# Patient Record
Sex: Female | Born: 2000 | Race: White | Hispanic: No | Marital: Single | Smoking: Never smoker
Health system: Southern US, Community
[De-identification: ages and names within clinical notes are randomized; demographics above are authoritative.]

## PROBLEM LIST (undated history)

## (undated) ENCOUNTER — Ambulatory Visit: Admission: EM | Payer: 59

## (undated) DIAGNOSIS — I959 Hypotension, unspecified: Secondary | ICD-10-CM

## (undated) DIAGNOSIS — R519 Headache, unspecified: Secondary | ICD-10-CM

## (undated) DIAGNOSIS — F419 Anxiety disorder, unspecified: Secondary | ICD-10-CM

## (undated) DIAGNOSIS — F329 Major depressive disorder, single episode, unspecified: Secondary | ICD-10-CM

## (undated) DIAGNOSIS — T7840XA Allergy, unspecified, initial encounter: Secondary | ICD-10-CM

## (undated) DIAGNOSIS — R51 Headache: Secondary | ICD-10-CM

## (undated) DIAGNOSIS — F32A Depression, unspecified: Secondary | ICD-10-CM

## (undated) DIAGNOSIS — D649 Anemia, unspecified: Secondary | ICD-10-CM

## (undated) HISTORY — DX: Headache, unspecified: R51.9

## (undated) HISTORY — DX: Hypotension, unspecified: I95.9

## (undated) HISTORY — DX: Allergy, unspecified, initial encounter: T78.40XA

## (undated) HISTORY — DX: Anxiety disorder, unspecified: F41.9

## (undated) HISTORY — PX: KNEE SURGERY: SHX244

## (undated) HISTORY — DX: Headache: R51

## (undated) HISTORY — PX: TOOTH EXTRACTION: SUR596

---
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
## 2019-01-01 ENCOUNTER — Other Ambulatory Visit (INDEPENDENT_AMBULATORY_CARE_PROVIDER_SITE_OTHER): Payer: Self-pay | Admitting: Neurology

## 2019-01-03 ENCOUNTER — Encounter (INDEPENDENT_AMBULATORY_CARE_PROVIDER_SITE_OTHER): Payer: Self-pay | Admitting: Neurology

## 2019-01-03 ENCOUNTER — Ambulatory Visit (INDEPENDENT_AMBULATORY_CARE_PROVIDER_SITE_OTHER): Payer: 59 | Admitting: Neurology

## 2019-01-03 VITALS — BP 100/68 | HR 82 | Ht 64.96 in | Wt 124.1 lb

## 2019-01-03 DIAGNOSIS — G43009 Migraine without aura, not intractable, without status migrainosus: Secondary | ICD-10-CM | POA: Diagnosis not present

## 2019-01-03 DIAGNOSIS — G44209 Tension-type headache, unspecified, not intractable: Secondary | ICD-10-CM

## 2019-01-03 MED ORDER — AMITRIPTYLINE HCL 25 MG PO TABS: 25.0000 mg | ORAL_TABLET | Freq: Every day | ORAL | 2 refills | Status: DC

## 2019-01-03 MED ORDER — PROPRANOLOL HCL 20 MG PO TABS: 20.0000 mg | ORAL_TABLET | Freq: Every day | ORAL | 0 refills | Status: DC

## 2019-01-03 NOTE — Progress Notes (Signed)
Patient: Andrea Wilson MRN: [id number] Sex: female DOB: 2001-06-16  Provider: Keturah Shaverseza Makiyah Zentz, MD [facility]  Note type: Routine return visit  Referral Source: Ruthe Mannanalia Aron, MD [facility] and Grandmother Chief Complaint: Headaches  History of Present Illness: Andrea Wilson is a 18 y.o. female is here for follow-up management of headache.  She has had episodes of migraine and tension type headaches with moderate to severe intensity and frequency for which she has been on 2 preventive medication including amitriptyline and propranolol. A couple of years ago she ran out of propranolol and she started having more frequent headaches so on her last visit she was recommended to continue both medications with very low-dose. She was last seen in January 2019 and since then she has been doing very well with no frequent headaches and has not been taking any OTC medications over the past few months except when she is sick or being congested. She usually sleeps well without any difficulty and with no awakening headaches.  She has no other issues such as dizziness or vomiting and has been doing fairly well academically at school. She has been taking both medications regularly without missing doses and has been tolerating medication well with no side effects.  Review of Systems: 12 system review as per HPI, otherwise negative.  Past Medical History:  Diagnosis Date  . Allergy   . Anxiety   . Depression   . Frequent headaches   . Hypotension    Hospitalizations: No., Head Injury: No., Nervous System Infections: No., Immunizations up to date: Yes.    Surgical History Past Surgical History:  Procedure Laterality Date  . KNEE SURGERY    . TOOTH EXTRACTION      Family History family history includes Autism in her cousin; Cancer in her maternal grandmother and sister; Hyperlipidemia in her mother; Hypertension in her father; Lung cancer  in her paternal grandfather; Migraines in an other family member; Seizures in her cousin.   Social History Social History   Socioeconomic History  . Marital status: Single    Spouse name: Not on file  . Number of children: Not on file  . Years of education: Not on file  . Highest education level: Not on file  Occupational History  . Not on file  Social Needs  . Financial resource strain: Not on file  . Food insecurity:    Worry: Not on file    Inability: Not on file  . Transportation needs:    Medical: Not on file    Non-medical: Not on file  Tobacco Use  . Smoking status: Never Smoker  . Smokeless tobacco: Never Used  Substance and Sexual Activity  . Alcohol use: No  . Drug use: No  . Sexual activity: Never  Lifestyle  . Physical activity:    Days per week: Not on file    Minutes per session: Not on file  . Stress: Not on file  Relationships  . Social connections:    Talks on phone: Not on file    Gets together: Not on file    Attends religious service: Not on file    Active member of club or organization: Not on file    Attends meetings of clubs or organizations: Not on file    Relationship status: Not on file  Other Topics Concern  . Not on file  Social History Narrative   12th grade student at  [employer]. She  does well in school.   Lives with mother and brother. Her sister passed away in 04-02-2013 at age 46 from cancer.   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      The medication list was reviewed and reconciled. All changes or newly prescribed medications were explained.  A complete medication list was provided to the patient/caregiver.  No Known Allergies  Physical Exam BP 100/68   Pulse 82   Ht 5' 4.96" (1.65 m)   Wt 124 lb 1.9 oz (56.3 kg)   LMP 12/08/2018   BMI 20.68 kg/m  Gen: Awake, alert, not in distress Skin: No rash, No neurocutaneous stigmata. HEENT: Normocephalic, no dysmorphic features, no conjunctival injection, nares  patent, mucous membranes moist, oropharynx clear. Neck: Supple, no meningismus. No focal tenderness. Resp: Clear to auscultation bilaterally CV: Regular rate, normal S1/S2, no murmurs, no rubs Abd: BS present, abdomen soft, non-tender, non-distended. No hepatosplenomegaly or mass Ext: Warm and well-perfused. No deformities, no muscle wasting, ROM full.  Neurological Examination: MS: Awake, alert, interactive. Normal eye contact, answered the questions appropriately, speech was fluent,  Normal comprehension.  Attention and concentration were normal. Cranial Nerves: Pupils were equal and reactive to light ( 5-50mm);  normal fundoscopic exam with sharp discs, visual field full with confrontation test; EOM normal, no nystagmus; no ptsosis, no double vision, intact facial sensation, face symmetric with full strength of facial muscles, hearing intact to finger rub bilaterally, palate elevation is symmetric, tongue protrusion is symmetric with full movement to both sides.  Sternocleidomastoid and trapezius are with normal strength. Tone-Normal Strength-Normal strength in all muscle groups DTRs-  Biceps Triceps Brachioradialis Patellar Ankle  R 2+ 2+ 2+ 2+ 2+  L 2+ 2+ 2+ 2+ 2+   Plantar responses flexor bilaterally, no clonus noted Sensation: Intact to light touch,  Romberg negative. Coordination: No dysmetria on FTN test. No difficulty with balance. Gait: Normal walk and run. Tandem gait was normal. Was able to perform toe walking and heel walking without difficulty.   Assessment and Plan 1. Migraine without aura and without status migrainosus, not intractable   2. Tension headache    This is an 18 year old female with history of migraine and tension type headaches with fairly good control on 2 preventive medications including amitriptyline and propranolol with fairly low dose.  She has no focal findings on her neurological examination.  She has not had any major headaches over the past few  months. Recommend to continue the same dose of propranolol at 20 mg every night. Recommend to decrease the dose of amitriptyline to half a tablet for the next month and see how she does.  If she develops more headaches, she may go back to the previous dose of medication otherwise she may discontinue medication at that time. She will continue with appropriate hydration and sleep and limited screen time. I would like to see her in 4 months for follow-up visit or sooner if she develops more frequent headaches.  She understood and agreed with the plan.  Meds ordered this encounter  Medications  . propranolol (INDERAL) 20 MG tablet    Sig: Take 1 tablet (20 mg total) by mouth at bedtime.    Dispense:  30 tablet    Refill:  0  . amitriptyline (ELAVIL) 25 MG tablet    Sig: Take 1 tablet (25 mg total) by mouth at bedtime.    Dispense:  90 tablet    Refill:  2

## 2019-01-03 NOTE — Patient Instructions (Signed)
Continue the same dose of propranolol every night for now Decrease the dose of amitriptyline to half a tablet every night for at least 1 month If no frequent headaches then you may discontinue amitriptyline otherwise you may go back to the previous dose of medication Continue with appropriate hydration and sleep and limited screen time May take occasional Tylenol or ibuprofen for moderate to severe headache Return in 4 months for follow-up visit

## 2019-01-04 ENCOUNTER — Other Ambulatory Visit (INDEPENDENT_AMBULATORY_CARE_PROVIDER_SITE_OTHER): Payer: Self-pay | Admitting: Neurology

## 2019-01-04 MED ORDER — PROPRANOLOL HCL 20 MG PO TABS: 20.0000 mg | ORAL_TABLET | Freq: Every day | ORAL | 0 refills | Status: DC

## 2019-01-04 NOTE — Telephone Encounter (Signed)
rx sent to the pharmacy for 90 days 

## 2019-01-04 NOTE — Telephone Encounter (Signed)
°  Who's calling (name and relationship to patient) : (mom) [name] Best contact number: [phone] Provider they see: Nab Reason for call: Salsabeel was seen in the office yesterday.  She needs a 3 month supply of Propanolol 20mg .  Mom said the 1 month RX that she was given yesterday has not been filled.     PRESCRIPTION REFILL ONLY  Name of prescription: Propanolol 20mg  [facility]: [facility]

## 2019-01-07 ENCOUNTER — Other Ambulatory Visit: Payer: Self-pay | Admitting: Primary Care

## 2019-01-07 DIAGNOSIS — F419 Anxiety disorder, unspecified: Principal | ICD-10-CM

## 2019-01-07 DIAGNOSIS — F329 Major depressive disorder, single episode, unspecified: Secondary | ICD-10-CM

## 2019-01-07 DIAGNOSIS — F32A Depression, unspecified: Secondary | ICD-10-CM

## 2019-01-08 NOTE — Telephone Encounter (Signed)
Last prescribed on 08/23/2018. Last office visit on 12/24/2018 with Dr Selena Batten. No future appointment

## 2019-01-09 NOTE — Telephone Encounter (Signed)
Noted, refill sent to pharmacy. 

## 2019-01-10 ENCOUNTER — Ambulatory Visit: Payer: 59 | Admitting: Family Medicine

## 2019-01-10 ENCOUNTER — Encounter: Payer: Self-pay | Admitting: Family Medicine

## 2019-01-10 VITALS — BP 106/64 | HR 113 | Temp 98.0°F | Ht 65.0 in

## 2019-01-10 DIAGNOSIS — J069 Acute upper respiratory infection, unspecified: Secondary | ICD-10-CM

## 2019-01-10 MED ORDER — AZITHROMYCIN 250 MG PO TABS: ORAL_TABLET | ORAL | 0 refills | Status: DC

## 2019-01-10 NOTE — Assessment & Plan Note (Signed)
Ongoing for over 2 wk -not resp to symptomatic care with worsening of sinus symptoms and cough  Cover with zpak  Disc use of expectorant with DM flonase Saline  Analgesic prn  Watch for fever/worse cough Update if not starting to improve in a week or if worsening

## 2019-01-10 NOTE — Patient Instructions (Signed)
mucinex dm is good for cough and congestion if needed  Continue flonase  Drink lots of fluids Tylenol and /or ibuprofen for fever or headaches or body aches   Take the zpak as directed - this many days in you may be developing a bacterial sinus or lung infection   Update if not starting to improve in a week or if worsening

## 2019-01-10 NOTE — Progress Notes (Signed)
Subjective:    Patient ID: Andrea Wilson, female    DOB: Jan 23, 2001, 18 y.o.   MRN: [id number]  HPI  Here for sinus symptoms /congestion   18 yo pt of NP Clark   Was seen for viral uri 1/20 with Dr Selena Batten Recommended sympt care -flonase/netti pot   Not getting better   otc Sudafed   md live - px cough medicine and flonase  Improved a little until yesterday  Woke up achey  Pain in R face/jaw Throat is sore   No fever  Some chills And the sweats  Took some tylenol last night   Nasal congestion- mostly clear/occ color Irritated throat -not severely sore  Feels worse on the right side   Coughing-all day  Some phlegm - ? If color to it   Patient Active Problem List   Diagnosis Date Noted  . Upper respiratory infection 01/10/2019  . Chest pain 08/23/2018  . Screening examination for STD (sexually transmitted disease) 05/08/2017  . Migraine without aura and without status migrainosus, not intractable 11/08/2016  . Chronic headaches 10/13/2016  . Anxiety and depression 04/30/2015   Past Medical History:  Diagnosis Date  . Allergy   . Anxiety   . Depression   . Frequent headaches   . Hypotension    Past Surgical History:  Procedure Laterality Date  . KNEE SURGERY    . TOOTH EXTRACTION     Social History   Tobacco Use  . Smoking status: Never Smoker  . Smokeless tobacco: Never Used  Substance Use Topics  . Alcohol use: No  . Drug use: No   Family History  Problem Relation Age of Onset  . Hyperlipidemia Mother   . Hypertension Father   . Cancer Sister   . Cancer Maternal Grandmother   . Lung cancer Paternal Grandfather   . Migraines Other        Mfhx of migraine  . Seizures Cousin   . Autism Cousin    No Known Allergies Current Outpatient Medications on File Prior to Visit  Medication Sig Dispense Refill  . amitriptyline (ELAVIL) 25 MG tablet Take 1 tablet (25 mg total) by mouth at bedtime. 90 tablet 2  . aspirin-acetaminophen-caffeine  (EXCEDRIN MIGRAINE) 250-250-65 MG tablet Take 2 tablets by mouth every 6 (six) hours as needed for headache.    . benzonatate (TESSALON) 100 MG capsule Take 1 capsule by mouth 2 (two) times daily as needed.    . fluticasone (FLONASE) 50 MCG/ACT nasal spray Place 1 spray into both nostrils daily.    . hydrOXYzine (ATARAX/VISTARIL) 25 MG tablet TAKE 1/2 TO 1 TABLET BY MOUTH 3 TIMES A DAY AS NEEDED FOR ANXIETY 30 tablet 0  . ibuprofen (ADVIL,MOTRIN) 800 MG tablet     . propranolol (INDERAL) 20 MG tablet Take 1 tablet (20 mg total) by mouth at bedtime. 90 tablet 0  . SUMAtriptan (IMITREX) 50 MG tablet TAKE 1 TABLET WITH 400-600 MG OF IBUPROFEN FOR MODERATE TO SEVERE HEADACHE, MAXIMUM 2 TIMES A WEEK 10 tablet 0  . venlafaxine XR (EFFEXOR-XR) 150 MG 24 hr capsule TAKE 1 CAPSULE(150 MG) BY MOUTH DAILY WITH BREAKFAST 90 capsule 1   No current facility-administered medications on file prior to visit.      Review of Systems  Constitutional: Positive for appetite change and fatigue. Negative for fever.  HENT: Positive for congestion, postnasal drip, rhinorrhea, sinus pressure, sneezing and sore throat. Negative for ear pain.   Eyes: Negative for pain and discharge.  Respiratory: Positive for cough. Negative for shortness of breath, wheezing and stridor.   Cardiovascular: Negative for chest pain.  Gastrointestinal: Negative for diarrhea, nausea and vomiting.  Genitourinary: Negative for frequency, hematuria and urgency.  Musculoskeletal: Negative for arthralgias and myalgias.  Skin: Negative for rash.  Neurological: Positive for headaches. Negative for dizziness, weakness and light-headedness.  Psychiatric/Behavioral: Negative for confusion and dysphoric mood.       Objective:   Physical Exam Constitutional:      General: She is not in acute distress.    Appearance: Normal appearance. She is well-developed. She is not ill-appearing, toxic-appearing or diaphoretic.  HENT:     Head: Normocephalic  and atraumatic.     Comments: Nares are injected and congested   Mild maxillary sinus tenderness    Right Ear: Tympanic membrane, ear canal and external ear normal.     Left Ear: Tympanic membrane, ear canal and external ear normal.     Nose: Congestion and rhinorrhea present.     Mouth/Throat:     Mouth: Mucous membranes are moist.     Pharynx: Oropharynx is clear. No oropharyngeal exudate or posterior oropharyngeal erythema.     Comments: Clear pnd  Eyes:     General:        Right eye: No discharge.        Left eye: No discharge.     Conjunctiva/sclera: Conjunctivae normal.     Pupils: Pupils are equal, round, and reactive to light.  Neck:     Musculoskeletal: Normal range of motion and neck supple.  Cardiovascular:     Rate and Rhythm: Normal rate.     Heart sounds: Normal heart sounds.  Pulmonary:     Effort: Pulmonary effort is normal. No respiratory distress.     Breath sounds: No stridor. Rhonchi present. No wheezing or rales.     Comments: Good air exch Scattered rhonchi No rales or wheezing Chest:     Chest wall: No tenderness.  Lymphadenopathy:     Cervical: No cervical adenopathy.  Skin:    General: Skin is warm and dry.     Capillary Refill: Capillary refill takes less than 2 seconds.     Findings: No rash.  Neurological:     Mental Status: She is alert.     Cranial Nerves: No cranial nerve deficit.  Psychiatric:        Mood and Affect: Mood normal.           Assessment & Plan:   Problem List Items Addressed This Visit      Respiratory   Upper respiratory infection - Primary    Ongoing for over 2 wk -not resp to symptomatic care with worsening of sinus symptoms and cough  Cover with zpak  Disc use of expectorant with DM flonase Saline  Analgesic prn  Watch for fever/worse cough Update if not starting to improve in a week or if worsening        Relevant Medications   azithromycin (ZITHROMAX Z-PAK) 250 MG tablet

## 2019-01-13 IMAGING — CT CT RENAL STONE PROTOCOL
2 of 4 series · 16 of 46 positions shown, 18 images · non-contrast
Comparison: None.

CLINICAL DATA: Abdominal pain for 5 days with some vomiting today

EXAM:
CT ABDOMEN AND PELVIS WITHOUT CONTRAST
TECHNIQUE: Multidetector CT imaging of the abdomen and pelvis was performed
following the standard protocol without IV contrast.

[Series 2: stone study 5.0 i30f 1 · axial · 0.60mm/px · z∈[-503,-173]mm · 13 of 75 slices shown, 15 images]
[im 6/75  soft-tissue]
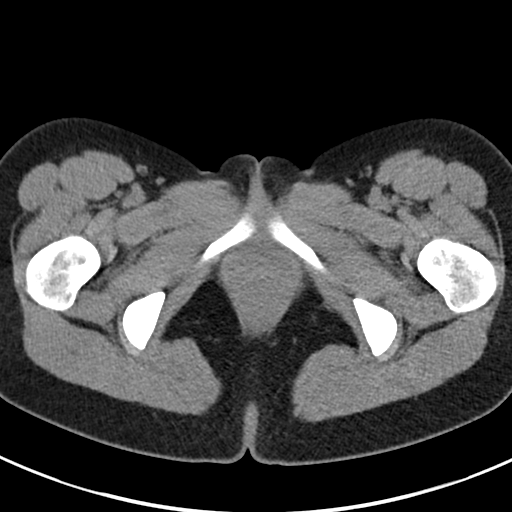
[im 6/75  bone]
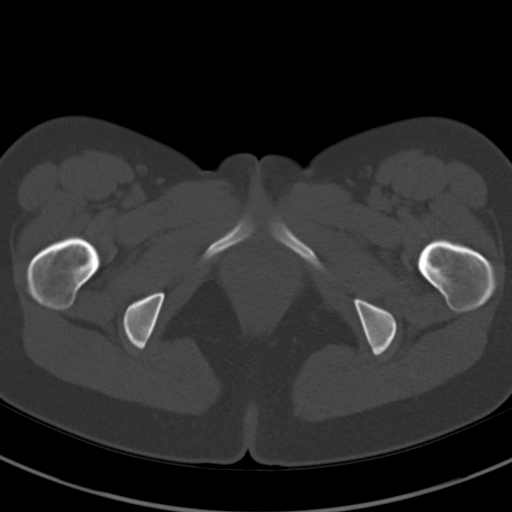
[im 11/75  soft-tissue]
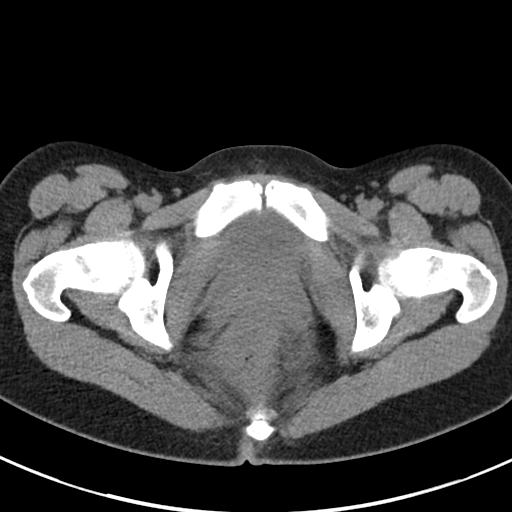
[im 17/75  soft-tissue]
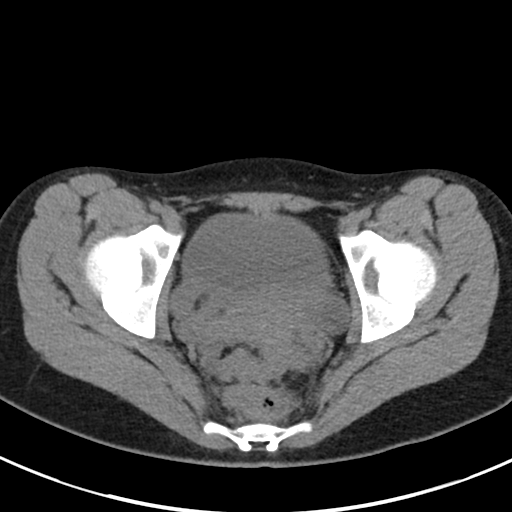
[im 22/75  soft-tissue]
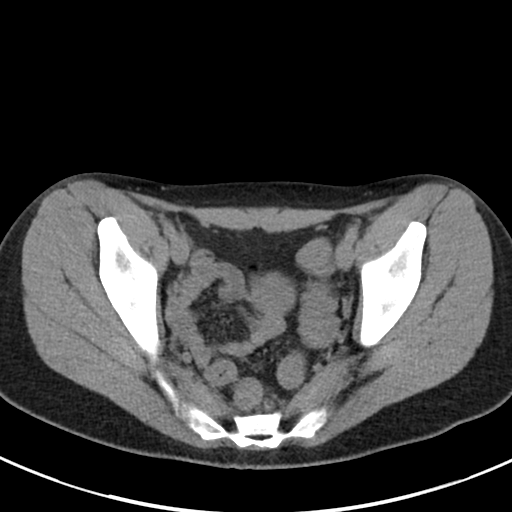
[im 28/75  soft-tissue]
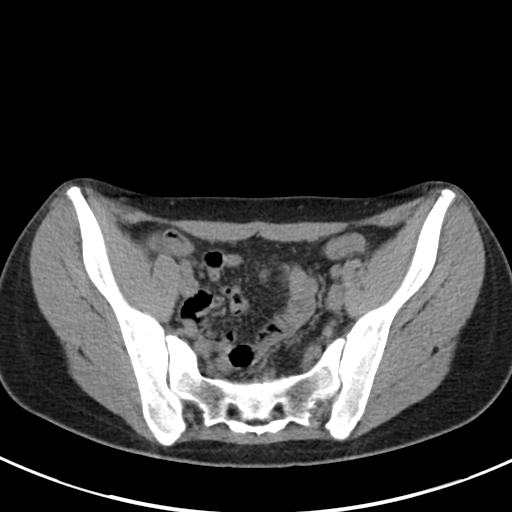
[im 33/75  soft-tissue]
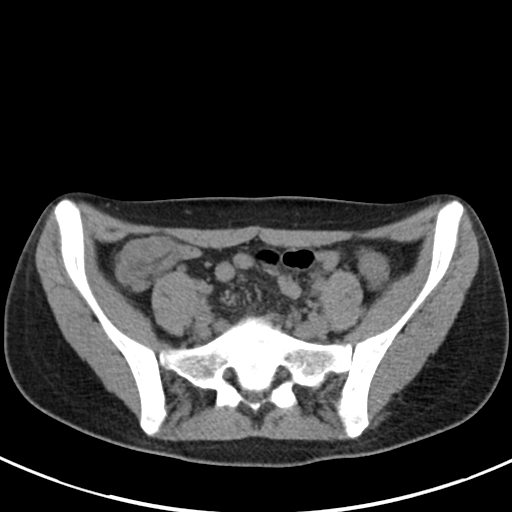
[im 39/75  soft-tissue]
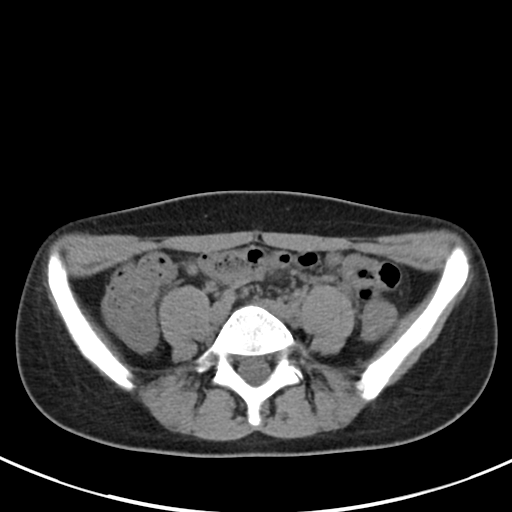
[im 44/75  soft-tissue]
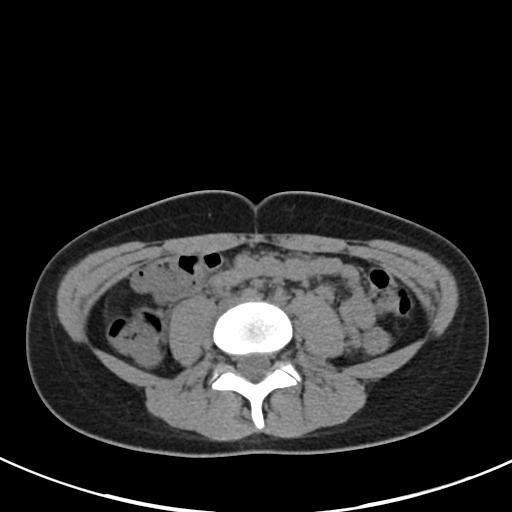
[im 50/75  soft-tissue]
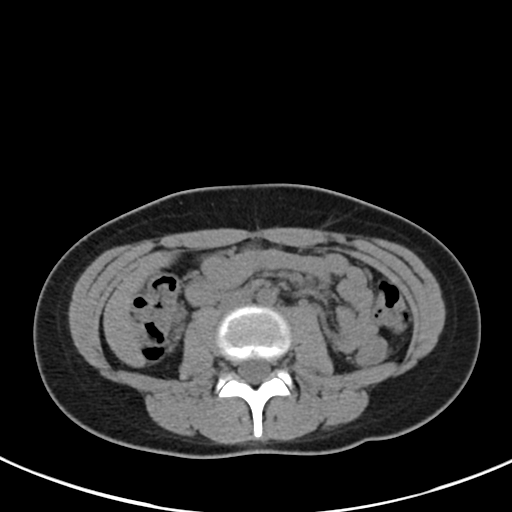
[im 50/75  bone]
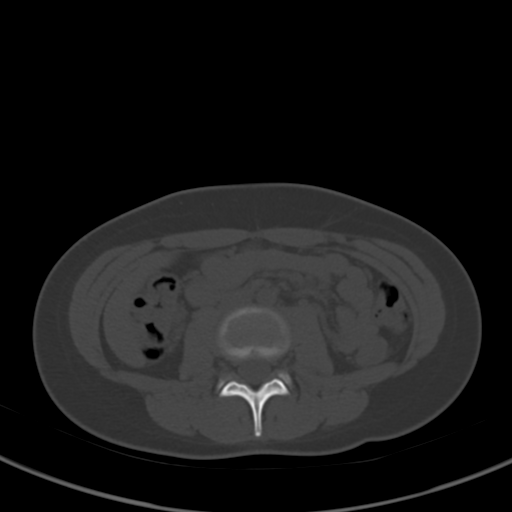
[im 55/75  soft-tissue]
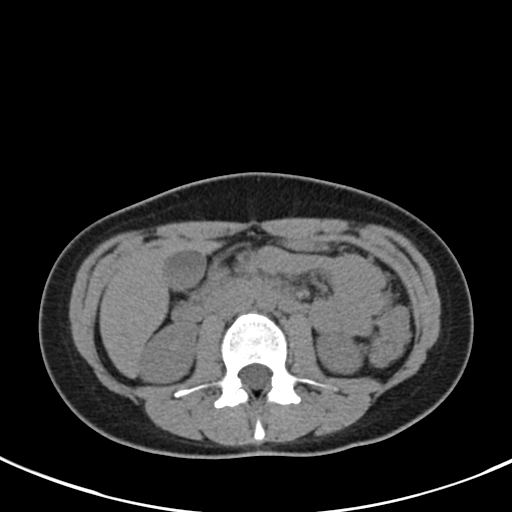
[im 61/75  soft-tissue]
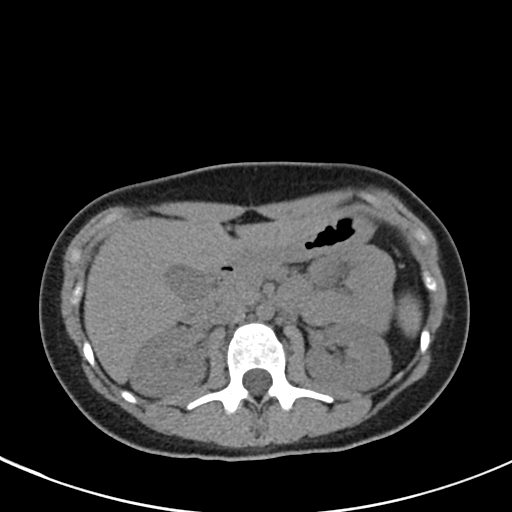
[im 66/75  soft-tissue]
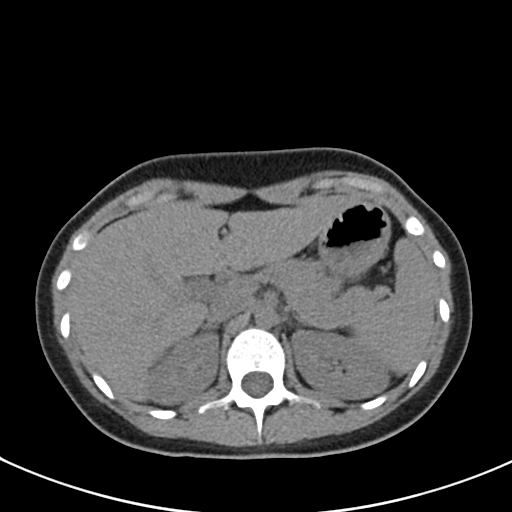
[im 72/75  soft-tissue]
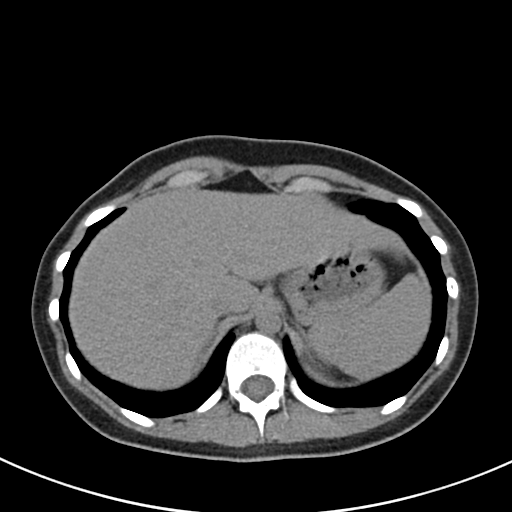

[Series 5: coronal soft tissue · coronal · 0.62mm/px · 3 of 56 slices shown]
[im 19/56  soft-tissue]
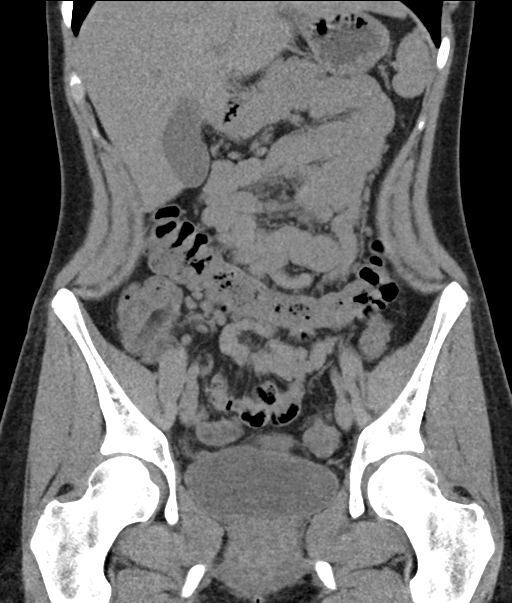
[im 25/56  soft-tissue]
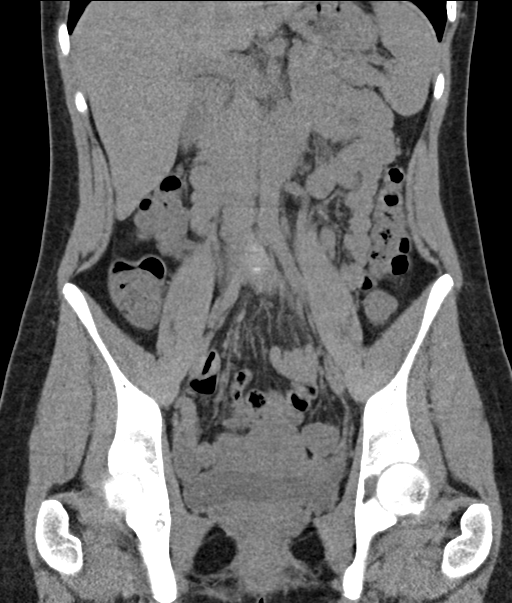
[im 31/56  soft-tissue]
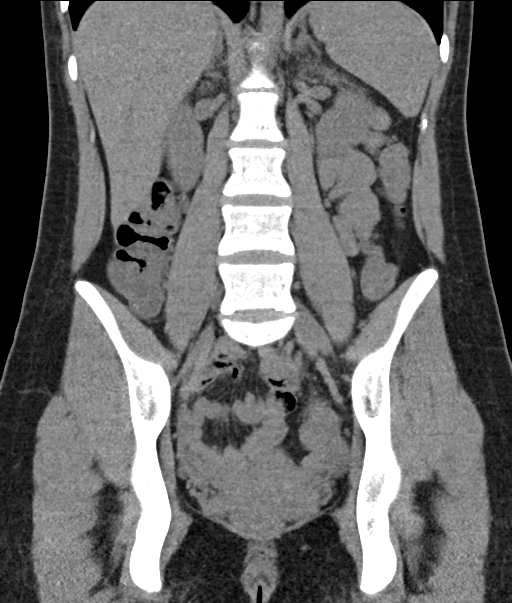

[16 of 46 positions shown; findings below may reference images not displayed]

FINDINGS: Lower chest: The lung bases are clear.

Hepatobiliary: The heart is within normal limits in size. No
calcified gallstones are seen.

Pancreas: On this unenhanced study, the pancreas is unremarkable.

Spleen: The spleen appears normal.

Adrenals/Urinary Tract: The adrenal glands appear symmetrical. No
renal calculi are seen and there is no evidence of hydronephrosis.
The ureters are difficult to visualize but no dilatation is seen.
The urinary bladder is unremarkable.

Stomach/Bowel: The stomach is largely decompressed. No small bowel
distention is seen. The colon is largely decompressed without
significant abnormality noted. The terminal ileum and the appendix
are difficult to visualize but n no evidence of inflammation is seen
within the right lower quadrant. There are a few slightly prominent
lymph nodes in the right lower quadrant and mesenteric adenitis
would be a consideration.

Vascular/Lymphatic: The abdominal aorta is normal in caliber. No
adenopathy is seen.

Reproductive: The uterus is normal in size. There appear to be small
follicles bilaterally the largest on the left of 2.2 cm. No free
fluid is seen within the pelvis.

Other: None.

Musculoskeletal: The lumbar vertebrae are in normal alignment.
Intervertebral disc spaces appear normal. The SI joints are well
corticated.
IMPRESSION: The only explanation for abdominal pain visualized on the current CT
are somewhat prominent lymph nodes in the right lower quadrant which
could indicate mesenteric adenitis. The appendix is not well seen
but no inflammatory process is noted.

## 2019-01-23 ENCOUNTER — Encounter: Payer: Self-pay | Admitting: Primary Care

## 2019-01-23 ENCOUNTER — Ambulatory Visit: Payer: 59 | Admitting: Primary Care

## 2019-01-23 DIAGNOSIS — J069 Acute upper respiratory infection, unspecified: Secondary | ICD-10-CM

## 2019-01-23 MED ORDER — HYDROCOD POLST-CPM POLST ER 10-8 MG/5ML PO SUER: 5.0000 mL | Freq: Two times a day (BID) | ORAL | 0 refills | Status: DC | PRN

## 2019-01-23 NOTE — Assessment & Plan Note (Signed)
Continued cough despite treatment on 01/10/19. Suspect post viral cough vs allergy involvement.  Exam today without evidence of bacterial cause. No wheezing, no prior history of asthma. Will have her start with Zyrtec nightly for at least one month. In order to provide her with some rest we will provide her with Tussionex to use HS for rest. Consider PPI if cough persists. She will update.

## 2019-01-23 NOTE — Patient Instructions (Addendum)
Start Zyrtec every night for the next one month.  You may take the cough suppressant every 12 hours as needed for cough and rest. Caution this medication contains codeine which may cause drowsiness.   Please call me in 2 weeks if no improvement in cough.  It was a pleasure to see you today!

## 2019-01-23 NOTE — Progress Notes (Signed)
Subjective:    Patient ID: [name], female    DOB: 03-18-2001, 18 y.o.   MRN: [id number]  HPI  Ms. Manzer is an 18 year old female who presents today with a chief complaint of cough.   She also reports sore throat (from cough), post nasal drip. She was last evaluated on 01/10/19 with a chief complaint of body aches, sore throat, chills, nasal congestion. She was treated with Azithromycin, Mucinex, Flonase.   Prior to her visit on 01/10/19 she was evaluated by Dr. Selena Batten on 12/24/18 for complaints of body aches, nasal congestion, sore throat, fatigue. She was presumed to have viral URI and was treated with symptomatic care. She tested negative for influenza.   Since treatment on 01/10/19 she noticed some improvement but no resolve. Her is present during the day and evening. Her cough has now been present since December 23, 2018. She's taking Tessalon Perles, OTC daytime pills without improvement.   Review of Systems  Constitutional: Positive for fatigue. Negative for fever.  HENT: Positive for postnasal drip and sore throat. Negative for sinus pressure.   Respiratory: Positive for cough.        Past Medical History:  Diagnosis Date  . Allergy   . Anxiety   . Depression   . Frequent headaches   . Hypotension      Social History   Socioeconomic History  . Marital status: Single    Spouse name: Not on file  . Number of children: Not on file  . Years of education: Not on file  . Highest education level: Not on file  Occupational History  . Not on file  Social Needs  . Financial resource strain: Not on file  . Food insecurity:    Worry: Not on file    Inability: Not on file  . Transportation needs:    Medical: Not on file    Non-medical: Not on file  Tobacco Use  . Smoking status: Never Smoker  . Smokeless tobacco: Never Used  Substance and Sexual Activity  . Alcohol use: No  . Drug use: No  . Sexual activity: Never  Lifestyle  . Physical activity:    Days  per week: Not on file    Minutes per session: Not on file  . Stress: Not on file  Relationships  . Social connections:    Talks on phone: Not on file    Gets together: Not on file    Attends religious service: Not on file    Active member of club or organization: Not on file    Attends meetings of clubs or organizations: Not on file    Relationship status: Not on file  . Intimate partner violence:    Fear of current or ex partner: Not on file    Emotionally abused: Not on file    Physically abused: Not on file    Forced sexual activity: Not on file  Other Topics Concern  . Not on file  Social History Narrative   12th grade student at  [employer]. She does well in school.   Lives with mother and brother. Her sister passed away in Apr 24, 2013 at age 92 from cancer.   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     Past Surgical History:  Procedure Laterality Date  . KNEE SURGERY    . TOOTH EXTRACTION      Family History  Problem Relation Age of Onset  . Hyperlipidemia Mother   . Hypertension  Father   . Cancer Sister   . Cancer Maternal Grandmother   . Lung cancer Paternal Grandfather   . Migraines Other        Mfhx of migraine  . Seizures Cousin   . Autism Cousin     No Known Allergies  Current Outpatient Medications on File Prior to Visit  Medication Sig Dispense Refill  . amitriptyline (ELAVIL) 25 MG tablet Take 1 tablet (25 mg total) by mouth at bedtime. 90 tablet 2  . aspirin-acetaminophen-caffeine (EXCEDRIN MIGRAINE) 250-250-65 MG tablet Take 2 tablets by mouth every 6 (six) hours as needed for headache.    . fluticasone (FLONASE) 50 MCG/ACT nasal spray Place 1 spray into both nostrils daily.    . hydrOXYzine (ATARAX/VISTARIL) 25 MG tablet TAKE 1/2 TO 1 TABLET BY MOUTH 3 TIMES A DAY AS NEEDED FOR ANXIETY 30 tablet 0  . ibuprofen (ADVIL,MOTRIN) 800 MG tablet     . propranolol (INDERAL) 20 MG tablet Take 1 tablet (20 mg total) by mouth at bedtime. 90 tablet 0    . SUMAtriptan (IMITREX) 50 MG tablet TAKE 1 TABLET WITH 400-600 MG OF IBUPROFEN FOR MODERATE TO SEVERE HEADACHE, MAXIMUM 2 TIMES A WEEK 10 tablet 0  . venlafaxine XR (EFFEXOR-XR) 150 MG 24 hr capsule TAKE 1 CAPSULE(150 MG) BY MOUTH DAILY WITH BREAKFAST 90 capsule 1   No current facility-administered medications on file prior to visit.     BP 118/74   Pulse (!) 105   Temp 98.2 F (36.8 C) (Oral)   Ht 5\' 5"  (1.651 m)   Wt 128 lb 8 oz (58.3 kg)   LMP 01/03/2019   SpO2 97%   BMI 21.38 kg/m    Objective:   Physical Exam  Constitutional: She appears well-nourished. She does not appear ill.  HENT:  Right Ear: Tympanic membrane and ear canal normal.  Left Ear: Tympanic membrane and ear canal normal.  Nose: No mucosal edema. Right sinus exhibits no maxillary sinus tenderness and no frontal sinus tenderness. Left sinus exhibits no maxillary sinus tenderness and no frontal sinus tenderness.  Mouth/Throat: Oropharynx is clear and moist.  Neck: Neck supple.  Cardiovascular: Normal rate and regular rhythm.  Respiratory: Effort normal and breath sounds normal. She has no wheezes.  Dry cough during exam  Skin: Skin is warm and dry.           Assessment & Plan:

## 2019-01-28 ENCOUNTER — Telehealth: Payer: Self-pay | Admitting: *Deleted

## 2019-01-28 DIAGNOSIS — J069 Acute upper respiratory infection, unspecified: Secondary | ICD-10-CM

## 2019-01-28 MED ORDER — HYDROCOD POLST-CPM POLST ER 10-8 MG/5ML PO SUER: 5.0000 mL | Freq: Two times a day (BID) | ORAL | 0 refills | Status: DC | PRN

## 2019-01-28 NOTE — Telephone Encounter (Signed)
Please notify patient that I am glad to hear that she is doing better and will provide her with one final refill of this cough medication. I will not be able to refill again.

## 2019-01-28 NOTE — Telephone Encounter (Signed)
Susette Racer (mom's boyfriend not on DPR) called stating that patient is doing better, but still has a terrible cough. Mr. Preston Fleeting stated that patient would like a refill on her cough medication. Mr. Preston Fleeting requested a call back to patient's mom if any questions. Pharmacy-[facility]/[place]

## 2019-01-28 NOTE — Telephone Encounter (Signed)
Tried to call patient's mother but voicemail box is full

## 2019-02-01 DIAGNOSIS — N3020 Other chronic cystitis without hematuria: Secondary | ICD-10-CM | POA: Diagnosis not present

## 2019-02-08 DIAGNOSIS — N898 Other specified noninflammatory disorders of vagina: Secondary | ICD-10-CM | POA: Diagnosis not present

## 2019-02-08 DIAGNOSIS — K644 Residual hemorrhoidal skin tags: Secondary | ICD-10-CM | POA: Diagnosis not present

## 2019-02-11 ENCOUNTER — Ambulatory Visit: Payer: 59 | Admitting: Primary Care

## 2019-02-11 ENCOUNTER — Encounter: Payer: Self-pay | Admitting: Primary Care

## 2019-02-11 DIAGNOSIS — K649 Unspecified hemorrhoids: Secondary | ICD-10-CM | POA: Insufficient documentation

## 2019-02-11 MED ORDER — HYDROCORTISONE ACETATE 25 MG RE SUPP: 25.0000 mg | Freq: Two times a day (BID) | RECTAL | 0 refills | Status: DC

## 2019-02-11 NOTE — Patient Instructions (Signed)
Insert the suppository twice daily in order to reduce the hemorrhoid size.   Please update me if no improvement within one week.  Make sure to eat plenty of fiber and drink plenty of water in order to prevent constipation.   It was a pleasure to see you today!   Hemorrhoids Hemorrhoids are swollen veins in and around the rectum or anus. There are two types of hemorrhoids:  Internal hemorrhoids. These occur in the veins that are just inside the rectum. They may poke through to the outside and become irritated and painful.  External hemorrhoids. These occur in the veins that are outside the anus and can be felt as a painful swelling or hard lump near the anus. Most hemorrhoids do not cause serious problems, and they can be managed with home treatments such as diet and lifestyle changes. If home treatments do not help the symptoms, procedures can be done to shrink or remove the hemorrhoids. What are the causes? This condition is caused by increased pressure in the anal area. This pressure may result from various things, including:  Constipation.  Straining to have a bowel movement.  Diarrhea.  Pregnancy.  Obesity.  Sitting for long periods of time.  Heavy lifting or other activity that causes you to strain.  Anal sex.  Riding a bike for a long period of time. What are the signs or symptoms? Symptoms of this condition include:  Pain.  Anal itching or irritation.  Rectal bleeding.  Leakage of stool (feces).  Anal swelling.  One or more lumps around the anus. How is this diagnosed? This condition can often be diagnosed through a visual exam. Other exams or tests may also be done, such as:  An exam that involves feeling the rectal area with a gloved hand (digital rectal exam).  An exam of the anal canal that is done using a small tube (anoscope).  A blood test, if you have lost a significant amount of blood.  A test to look inside the colon using a flexible tube  with a camera on the end (sigmoidoscopy or colonoscopy). How is this treated? This condition can usually be treated at home. However, various procedures may be done if dietary changes, lifestyle changes, and other home treatments do not help your symptoms. These procedures can help make the hemorrhoids smaller or remove them completely. Some of these procedures involve surgery, and others do not. Common procedures include:  Rubber band ligation. Rubber bands are placed at the base of the hemorrhoids to cut off their blood supply.  Sclerotherapy. Medicine is injected into the hemorrhoids to shrink them.  Infrared coagulation. A type of light energy is used to get rid of the hemorrhoids.  Hemorrhoidectomy surgery. The hemorrhoids are surgically removed, and the veins that supply them are tied off.  Stapled hemorrhoidopexy surgery. The surgeon staples the base of the hemorrhoid to the rectal wall. Follow these instructions at home: Eating and drinking   Eat foods that have a lot of fiber in them, such as whole grains, beans, nuts, fruits, and vegetables.  Ask your health care provider about taking products that have added fiber (fiber supplements).  Reduce the amount of fat in your diet. You can do this by eating low-fat dairy products, eating less red meat, and avoiding processed foods.  Drink enough fluid to keep your urine pale yellow. Managing pain and swelling   Take warm sitz baths for 20 minutes, 3-4 times a day to ease pain and discomfort. You may  do this in a bathtub or using a portable sitz bath that fits over the toilet.  If directed, apply ice to the affected area. Using ice packs between sitz baths may be helpful. ? Put ice in a plastic bag. ? Place a towel between your skin and the bag. ? Leave the ice on for 20 minutes, 2-3 times a day. General instructions  Take over-the-counter and prescription medicines only as told by your health care provider.  Use medicated  creams or suppositories as told.  Get regular exercise. Ask your health care provider how much and what kind of exercise is best for you. In general, you should do moderate exercise for at least 30 minutes on most days of the week (150 minutes each week). This can include activities such as walking, biking, or yoga.  Go to the bathroom when you have the urge to have a bowel movement. Do not wait.  Avoid straining to have bowel movements.  Keep the anal area dry and clean. Use wet toilet paper or moist towelettes after a bowel movement.  Do not sit on the toilet for long periods of time. This increases blood pooling and pain.  Keep all follow-up visits as told by your health care provider. This is important. Contact a health care provider if you have:  Increasing pain and swelling that are not controlled by treatment or medicine.  Difficulty having a bowel movement, or you are unable to have a bowel movement.  Pain or inflammation outside the area of the hemorrhoids. Get help right away if you have:  Uncontrolled bleeding from your rectum. Summary  Hemorrhoids are swollen veins in and around the rectum or anus.  Most hemorrhoids can be managed with home treatments such as diet and lifestyle changes.  Taking warm sitz baths can help ease pain and discomfort.  In severe cases, procedures or surgery can be done to shrink or remove the hemorrhoids. This information is not intended to replace advice given to you by your health care provider. Make sure you discuss any questions you have with your health care provider. Document Released: 11/18/2000 Document Revised: 04/12/2018 Document Reviewed: 04/12/2018 Elsevier Interactive Patient Education  2019 [employer].

## 2019-02-11 NOTE — Assessment & Plan Note (Signed)
External, non thrombosed. Rx for [facility] suppositories sent to pharmacy. Discussed sitz baths/warm baths, etc. She will update if no improvement.

## 2019-02-11 NOTE — Progress Notes (Signed)
Subjective:    Patient ID: Andrea Wilson, female    DOB: 11-01-2001, 18 y.o.   MRN: [id number]  HPI  Andrea Wilson is an 18 year old female who presents today with a chief complaint of hemorrhoid.  She thinks she has a hemorrhoid for which she noticed one month ago. She's been "pushing" the hemorrhoid back up into the rectum intermittently since then. Over the last one week she's noticed an increase in size and pain with discomfort in sitting. She went to her GYN three days ago who told her that she had a "blood clot" within the hemorrhoid. She was told to take really warm baths in order to decrease the size. She's not noticed a decrease in size. She's used an OTC "cream" without improvement. She did notice a small amount of bright red rectal bleeding several weeks ago.   She denies fevers, chills, recent rectal bleeding, diarrhea, constipation.   Review of Systems  Constitutional: Negative for chills and fever.  Gastrointestinal: Negative for abdominal pain, blood in stool, constipation, diarrhea and nausea.       Past Medical History:  Diagnosis Date  . Allergy   . Anxiety   . Depression   . Frequent headaches   . Hypotension      Social History   Socioeconomic History  . Marital status: Single    Spouse name: Not on file  . Number of children: Not on file  . Years of education: Not on file  . Highest education level: Not on file  Occupational History  . Not on file  Social Needs  . Financial resource strain: Not on file  . Food insecurity:    Worry: Not on file    Inability: Not on file  . Transportation needs:    Medical: Not on file    Non-medical: Not on file  Tobacco Use  . Smoking status: Never Smoker  . Smokeless tobacco: Never Used  Substance and Sexual Activity  . Alcohol use: No  . Drug use: No  . Sexual activity: Never  Lifestyle  . Physical activity:    Days per week: Not on file    Minutes per session: Not on file  . Stress: Not on file    Relationships  . Social connections:    Talks on phone: Not on file    Gets together: Not on file    Attends religious service: Not on file    Active member of club or organization: Not on file    Attends meetings of clubs or organizations: Not on file    Relationship status: Not on file  . Intimate partner violence:    Fear of current or ex partner: Not on file    Emotionally abused: Not on file    Physically abused: Not on file    Forced sexual activity: Not on file  Other Topics Concern  . Not on file  Social History Narrative   12th grade student at  [employer]. She does well in school.   Lives with mother and brother. Her sister passed away in 04-10-2013 at age 11 from cancer.   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     Past Surgical History:  Procedure Laterality Date  . KNEE SURGERY    . TOOTH EXTRACTION      Family History  Problem Relation Age of Onset  . Hyperlipidemia Mother   . Hypertension Father   . Cancer Sister   . Cancer Maternal  Grandmother   . Lung cancer Paternal Grandfather   . Migraines Other        Mfhx of migraine  . Seizures Cousin   . Autism Cousin     No Known Allergies  Current Outpatient Medications on File Prior to Visit  Medication Sig Dispense Refill  . amitriptyline (ELAVIL) 25 MG tablet Take 1 tablet (25 mg total) by mouth at bedtime. 90 tablet 2  . aspirin-acetaminophen-caffeine (EXCEDRIN MIGRAINE) 250-250-65 MG tablet Take 2 tablets by mouth every 6 (six) hours as needed for headache.    . chlorpheniramine-HYDROcodone (TUSSIONEX PENNKINETIC ER) 10-8 MG/5ML SUER Take 5 mLs by mouth every 12 (twelve) hours as needed for cough. 50 mL 0  . fluticasone (FLONASE) 50 MCG/ACT nasal spray Place 1 spray into both nostrils daily.    . hydrOXYzine (ATARAX/VISTARIL) 25 MG tablet TAKE 1/2 TO 1 TABLET BY MOUTH 3 TIMES A DAY AS NEEDED FOR ANXIETY 30 tablet 0  . ibuprofen (ADVIL,MOTRIN) 800 MG tablet     . propranolol (INDERAL) 20 MG  tablet Take 1 tablet (20 mg total) by mouth at bedtime. 90 tablet 0  . SUMAtriptan (IMITREX) 50 MG tablet TAKE 1 TABLET WITH 400-600 MG OF IBUPROFEN FOR MODERATE TO SEVERE HEADACHE, MAXIMUM 2 TIMES A WEEK 10 tablet 0  . venlafaxine XR (EFFEXOR-XR) 150 MG 24 hr capsule TAKE 1 CAPSULE(150 MG) BY MOUTH DAILY WITH BREAKFAST 90 capsule 1   No current facility-administered medications on file prior to visit.     BP 114/70   Pulse 89   Temp 98 F (36.7 C) (Oral)   Ht 5\' 5"  (1.651 m)   Wt 125 lb (56.7 kg)   LMP 02/06/2019   SpO2 98%   BMI 20.80 kg/m    Objective:   Physical Exam  Constitutional: She appears well-nourished.  Neck: Neck supple.  Cardiovascular: Normal rate and regular rhythm.  Respiratory: Effort normal and breath sounds normal.  Genitourinary: Rectum:     External hemorrhoid present.     No tenderness or internal hemorrhoid.   Skin: Skin is warm and dry.           Assessment & Plan:

## 2019-04-03 ENCOUNTER — Ambulatory Visit: Payer: Self-pay | Admitting: Primary Care

## 2019-04-03 NOTE — Telephone Encounter (Signed)
Returned pt's call regarding having a bloody stool. Pt stated that over the weekend she has some nausea and vomiting and a stomach ache and had a normal stool. Today she is feeling queasy and went to the bathroom and had a bloody stool, dark in color that turned the commode water "orangy". She stated  she had 2 stools today and the first one was a normal Bm. She is experiencing pain under her belly button this achy and somewhat stabbing at times. She does not feel bloated or distended. No fever. Pt is requesting an appointment for tomorrow. Advised that if she has more bloody stools or the pain gets worst to go to an urgent care of ED.  Advised on taking ginger ale and ice chips, bland foods for the queasiness. Also advised of having a virtual visit with her provider. Pt voiced understanding. Routing to [facility] at [facility] for review and appointment. Reason for Disposition . MODERATE rectal bleeding (small blood clots, passing blood without stool, or toilet water turns red)  Answer Assessment - Initial Assessment Questions 1. APPEARANCE of BLOOD: "What color is it?" "Is it passed separately, on the surface of the stool, or mixed in with the stool?"      Dark red and mixed with stool 2. AMOUNT: "How much blood was passed?"     Covered commode  3. FREQUENCY: "How many times has blood been passed with the stools?"     once 4. ONSET: "When was the blood first seen in the stools?" (Days or weeks)      yes 5. DIARRHEA: "Is there also some diarrhea?" If so, ask: "How many diarrhea stools were passed in past 24 hours?"      Yes just once 6. CONSTIPATION: "Do you have constipation?" If so, "How bad is it?"     no 7. RECURRENT SYMPTOMS: "Have you had blood in your stools before?" If so, ask: "When was the last time?" and "What happened that time?"      Never had blood in stool before 8. BLOOD THINNERS: "Do you take any blood thinners?" (e.g., Coumadin/warfarin, Pradaxa/dabigatran, aspirin)     No  9.  OTHER SYMPTOMS: "Do you have any other symptoms?"  (e.g., abdominal pain, vomiting, dizziness, fever)     Abdominal pain, vomited over the weekend, nauseated,  10. PREGNANCY: "Is there any chance you are pregnant?" "When was your last menstrual period?"       Not pregnant, LMP within the last month  Protocols used: RECTAL BLEEDING-A-AH

## 2019-04-04 ENCOUNTER — Ambulatory Visit (INDEPENDENT_AMBULATORY_CARE_PROVIDER_SITE_OTHER): Payer: 59 | Admitting: Primary Care

## 2019-04-04 DIAGNOSIS — R21 Rash and other nonspecific skin eruption: Secondary | ICD-10-CM

## 2019-04-04 DIAGNOSIS — K649 Unspecified hemorrhoids: Secondary | ICD-10-CM

## 2019-04-04 DIAGNOSIS — R1033 Periumbilical pain: Secondary | ICD-10-CM | POA: Diagnosis not present

## 2019-04-04 DIAGNOSIS — R109 Unspecified abdominal pain: Secondary | ICD-10-CM | POA: Insufficient documentation

## 2019-04-04 NOTE — Progress Notes (Signed)
Subjective:    Patient ID: Andrea Wilson, female    DOB: 2001-09-26, 18 y.o.   MRN: [id number]  HPI  Virtual Visit via Video Note  I connected with Andrea Wilson on 04/04/19 at  9:20 AM EDT by a video enabled telemedicine application and verified that I am speaking with the correct person using two identifiers.  Location: Patient: Home Provider: Office   I discussed the limitations of evaluation and management by telemedicine and the availability of in person appointments. The patient expressed understanding and agreed to proceed.  History of Present Illness:  [name] is an 18 year old female with a history of hemorrhoids who presents today with a chief complaint of rectal bleeding.  She called in yesterday with reports of nausea, vomiting, abdominal discomfort that began 5-6 days ago. Yesterday she felt nauseated, also with periumbilical abdominal pain so she proceeded to the bathroom where she had diarrhea with dark bloody bowel movement with one small dark red clot.    Today she has not had a bowel movement and feels mostly better, just feels tired. She denies nausea and abdominal pain today, she does notice a hemorrhoid. The hemorrhoid now that is overall not bothersome, but yesterday was painful during her bowel movement. She's had little PO intake including food and liquids over this week due to nausea. She denies fevers, esophageal reflux, diaphoresis, vomiting since last week.   She also reports a skin discoloration to the bilateral upper medial thighs that she first noticed yesterday. The rash is itchy and a purple color. She's been using an oatmeal soap and taken a bath with improvement. Her grandmother did recently change laundry detergents.    Observations/Objective:  Appears well, not sickly. Skin warm and dry. Abdomen non tender upon patient palpating. Speaking in complete sentences. Few small pinpoint dots to right medial upper thigh that appear purple. No  erythema, obvious changes in skin texture.   Assessment and Plan:  See problem based charting.  Follow Up Instructions:  Glad you are feeling better!  Please notify me if your abdominal pain returns, you develop fevers, your nausea and vomiting return, you continue to notice blood in the stools.  It was a pleasure to see you today!    I discussed the assessment and treatment plan with the patient. The patient was provided an opportunity to ask questions and all were answered. The patient agreed with the plan and demonstrated an understanding of the instructions.   The patient was advised to call back or seek an in-person evaluation if the symptoms worsen or if the condition fails to improve as anticipated.     Doreene Nest, NP    Review of Systems  Constitutional: Positive for fatigue. Negative for fever and unexpected weight change.  Respiratory: Negative for shortness of breath.   Cardiovascular: Negative for chest pain.  Gastrointestinal: Positive for abdominal pain, diarrhea and nausea.  Skin: Positive for rash.       Past Medical History:  Diagnosis Date  . Allergy   . Anxiety   . Depression   . Frequent headaches   . Hypotension      Social History   Socioeconomic History  . Marital status: Single    Spouse name: Not on file  . Number of children: Not on file  . Years of education: Not on file  . Highest education level: Not on file  Occupational History  . Not on file  Social Needs  . Financial  resource strain: Not on file  . Food insecurity:    Worry: Not on file    Inability: Not on file  . Transportation needs:    Medical: Not on file    Non-medical: Not on file  Tobacco Use  . Smoking status: Never Smoker  . Smokeless tobacco: Never Used  Substance and Sexual Activity  . Alcohol use: No  . Drug use: No  . Sexual activity: Never  Lifestyle  . Physical activity:    Days per week: Not on file    Minutes per session: Not on file  .  Stress: Not on file  Relationships  . Social connections:    Talks on phone: Not on file    Gets together: Not on file    Attends religious service: Not on file    Active member of club or organization: Not on file    Attends meetings of clubs or organizations: Not on file    Relationship status: Not on file  . Intimate partner violence:    Fear of current or ex partner: Not on file    Emotionally abused: Not on file    Physically abused: Not on file    Forced sexual activity: Not on file  Other Topics Concern  . Not on file  Social History Narrative   12th grade student at  [employer]. She does well in school.   Lives with mother and brother. Her sister passed away in 2014 at age 19 from cancer.   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     Past Surgical History:  Procedure Laterality Date  . KNEE SURGERY    . TOOTH EXTRACTION      Family History  Problem Relation Age of Onset  . Hyperlipidemia Mother   . Hypertension Father   . Cancer Sister   . Cancer Maternal Grandmother   . Lung cancer Paternal Grandfather   . Migraines Other        Mfhx of migraine  . Seizures Cousin   . Autism Cousin     No Known Allergies  Current Outpatient Medications on File Prior to Visit  Medication Sig Dispense Refill  . amitriptyline (ELAVIL) 25 MG tablet Take 1 tablet (25 mg total) by mouth at bedtime. 90 tablet 2  . aspirin-acetaminophen-caffeine (EXCEDRIN MIGRAINE) 250-250-65 MG tablet Take 2 tablets by mouth every 6 (six) hours as needed for headache.    . chlorpheniramine-HYDROcodone (TUSSIONEX PENNKINETIC ER) 10-8 MG/5ML SUER Take 5 mLs by mouth every 12 (twelve) hours as needed for cough. 50 mL 0  . fluticasone (FLONASE) 50 MCG/ACT nasal spray Place 1 spray into both nostrils daily.    . hydrocortisone (ANUSOL-HC) 25 MG suppository Place 1 suppository (25 mg total) rectally 2 (two) times daily. 12 suppository 0  . hydrOXYzine (ATARAX/VISTARIL) 25 MG tablet TAKE 1/2  TO 1 TABLET BY MOUTH 3 TIMES A DAY AS NEEDED FOR ANXIETY 30 tablet 0  . ibuprofen (ADVIL,MOTRIN) 800 MG tablet     . propranolol (INDERAL) 20 MG tablet Take 1 tablet (20 mg total) by mouth at bedtime. 90 tablet 0  . SUMAtriptan (IMITREX) 50 MG tablet TAKE 1 TABLET WITH 400-600 MG OF IBUPROFEN FOR MODERATE TO SEVERE HEADACHE, MAXIMUM 2 TIMES A WEEK 10 tablet 0  . venlafaxine XR (EFFEXOR-XR) 150 MG 24 hr capsule TAKE 1 CAPSULE(150 MG) BY MOUTH DAILY WITH BREAKFAST 90 capsule 1   No current facility-administered medications on file prior to visit.  There were no vitals taken for this visit.   Objective:   Physical Exam  Constitutional: She is oriented to person, place, and time. She appears well-nourished. She does not have a sickly appearance. She does not appear ill.  Respiratory: Effort normal.  GI: Soft. Normal appearance. There is no abdominal tenderness.  Neurological: She is alert and oriented to person, place, and time.  Skin: Skin is dry.  Few small pinpoint dots to right medial upper thigh that appear purple. No erythema, obvious changes in skin texture.    Psychiatric: She has a normal mood and affect.           Assessment & Plan:

## 2019-04-04 NOTE — Assessment & Plan Note (Signed)
No obvious rash, just a few flat, purple, purpura. No evidence of wound, cellulitis, insect bite. Discussed antihistamine, hydrocorotisone for itching. Consider switching to original laundry detergent.  She will update.

## 2019-04-04 NOTE — Assessment & Plan Note (Signed)
One episode of "dark red" bleeding with one small clot after a bowel movement. Also with hemorrhoid discomfort.  Suspect bleeding to be secondary to hemorrhoid, she only had one episode of bleeding and it occurred with a bowel movement.   Discussed to notify if her rectal bleeding returns, at that point we will need to bring her into the office for a physical exam and further work up.   I did recommend preparation H OTC as she could not afford the Anusol HC suppositories. She will update.

## 2019-04-04 NOTE — Assessment & Plan Note (Signed)
Present for nearly one week, no pain today.  She did have associated symptoms of nausea with 2 episodes of vomiting. Exam today reassuring, she was able to palpate her entire abdomen and had no discomfort. Abdomen appeared soft.   Differentials include IBS, viral gastritis, GERD. Low suspicion for acute appendicitis, cholecystitis.  She will update if symptoms return.

## 2019-04-04 NOTE — Patient Instructions (Signed)
Glad you are feeling better!  Please notify me if your abdominal pain returns, you develop fevers, your nausea and vomiting return, you continue to notice blood in the stools.  It was a pleasure to see you today!

## 2019-04-04 NOTE — Telephone Encounter (Signed)
Patient evaluated.  

## 2019-04-04 NOTE — Telephone Encounter (Addendum)
I spoke with pt; today she feels a little better;no nausea but pt has developed rash on both thighs, one is on inner thigh and the other is near the groin area.rash started on 04/03/19. Rash is not spreading but is popping up in different places on the thighs. Rash is itchy. Pt said she is itchy everywhere but only has rash on thighs; no fever,no cough and no SOB. No travel and no known exposure to +covid or flu. Pt had diarrhea with dark red blood that was mixed with stool in commode. Pt did see one clot of blood in commode also 04/03/19 at 5 PM. Pt has not had BM since and has seen no more blood from rectum.no abd pain today but 04/03/19 had lower achy and stabbing pain in lower abd.no UTI symptoms. No back pain. Pt scheduled virtual visit 04/04/19 at 9:20.

## 2019-04-10 ENCOUNTER — Telehealth (INDEPENDENT_AMBULATORY_CARE_PROVIDER_SITE_OTHER): Payer: Self-pay

## 2019-04-10 MED ORDER — PROPRANOLOL HCL 20 MG PO TABS: 20.0000 mg | ORAL_TABLET | Freq: Every day | ORAL | 0 refills | Status: DC

## 2019-04-10 NOTE — Telephone Encounter (Signed)
refill 

## 2019-05-22 ENCOUNTER — Other Ambulatory Visit: Payer: Self-pay

## 2019-05-22 ENCOUNTER — Ambulatory Visit (INDEPENDENT_AMBULATORY_CARE_PROVIDER_SITE_OTHER): Payer: 59 | Admitting: Primary Care

## 2019-05-22 DIAGNOSIS — R110 Nausea: Secondary | ICD-10-CM | POA: Insufficient documentation

## 2019-05-22 DIAGNOSIS — R946 Abnormal results of thyroid function studies: Secondary | ICD-10-CM | POA: Diagnosis not present

## 2019-05-22 NOTE — Patient Instructions (Signed)
Call the office to schedule a lab only appointment.  You can try famotidine (Pepcid) 1-2 times daily for your symptoms.  Advance your diet as tolerated. Make sure to drink plenty of water.  It was a pleasure to see you today!

## 2019-05-22 NOTE — Assessment & Plan Note (Signed)
Nausea with decrease in appetite x 1 week. Doesn't appear sickly, no distress. Differentials include silent reflux, IBS. Repeat thyroid labs, add on CBC. Recommend Pepcid 1-2 times daily for potential silent reflux. Await labs. Return precautions provided.

## 2019-05-22 NOTE — Progress Notes (Signed)
Subjective:    Patient ID: Andrea Wilson, female    DOB: 04/01/2001, 18 y.o.   MRN: [id number]  HPI  Virtual Visit via Video Note  I connected with Andrea Wilson on 05/22/19 at 11:20 AM EDT by a video enabled telemedicine application and verified that I am speaking with the correct person using two identifiers.  Location: Patient: Home Provider: Office   I discussed the limitations of evaluation and management by telemedicine and the availability of in person appointments. The patient expressed understanding and agreed to proceed.  History of Present Illness:  [name] is an 18 year old female with a history of hemorrhoids, anxiety and depression, migraines who presents today with a chief complaint of nausea and vomiting.  She also reports decrease in appetite. Symptoms began about one week ago with decrease appetite and nausea. Two evenings ago she tried eating ice cream as she hadn't eaten anything that day. Soon after eating ice cream she vomited. This was the first and only time she has vomited. She has been eating and drinking, just small portions.   She denies diarrhea, constipation, abdominal pain, increased anxiety, esophageal burning, belching. Her last bowel movement was yesterday which was softer than usual.   She does have a history of abnormal TSH from July 2019, repeat TSH and Free T4 in September 2019 unremarkable.    Observations/Objective:  Alert and oriented. Appears well, not sickly. No distress. Speaking in complete sentences.   Assessment and Plan:  Nausea with decrease in appetite x 1 week. Doesn't appear sickly, no distress. Differentials include silent reflux, IBS. Repeat thyroid labs, add on CBC. Recommend Pepcid 1-2 times daily for potential silent reflux. Await labs. Return precautions provided.   Follow Up Instructions:  Call the office to schedule a lab only appointment.  You can try famotidine (Pepcid) 1-2 times daily for your  symptoms.  Advance your diet as tolerated. Make sure to drink plenty of water.  It was a pleasure to see you today!    I discussed the assessment and treatment plan with the patient. The patient was provided an opportunity to ask questions and all were answered. The patient agreed with the plan and demonstrated an understanding of the instructions.   The patient was advised to call back or seek an in-person evaluation if the symptoms worsen or if the condition fails to improve as anticipated.     Doreene NestKatherine K Tonji Elliff, NP    Review of Systems  Constitutional: Negative for chills and fever.  Cardiovascular: Negative for palpitations.  Gastrointestinal: Positive for nausea. Negative for abdominal pain, constipation and diarrhea.       One episode of vomiting  Endocrine: Negative for cold intolerance and heat intolerance.       Past Medical History:  Diagnosis Date  . Allergy   . Anxiety   . Depression   . Frequent headaches   . Hypotension      Social History   Socioeconomic History  . Marital status: Single    Spouse name: Not on file  . Number of children: Not on file  . Years of education: Not on file  . Highest education level: Not on file  Occupational History  . Not on file  Social Needs  . Financial resource strain: Not on file  . Food insecurity    Worry: Not on file    Inability: Not on file  . Transportation needs    Medical: Not on file  Non-medical: Not on file  Tobacco Use  . Smoking status: Never Smoker  . Smokeless tobacco: Never Used  Substance and Sexual Activity  . Alcohol use: No  . Drug use: No  . Sexual activity: Never  Lifestyle  . Physical activity    Days per week: Not on file    Minutes per session: Not on file  . Stress: Not on file  Relationships  . Social Herbalist on phone: Not on file    Gets together: Not on file    Attends religious service: Not on file    Active member of club or organization: Not on file     Attends meetings of clubs or organizations: Not on file    Relationship status: Not on file  . Intimate partner violence    Fear of current or ex partner: Not on file    Emotionally abused: Not on file    Physically abused: Not on file    Forced sexual activity: Not on file  Other Topics Concern  . Not on file  Social History Narrative   12th grade student at  [employer]. She does well in school.   Lives with mother and brother. Her sister passed away in Apr 16, 2013 at age 79 from cancer.   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     Past Surgical History:  Procedure Laterality Date  . KNEE SURGERY    . TOOTH EXTRACTION      Family History  Problem Relation Age of Onset  . Hyperlipidemia Mother   . Hypertension Father   . Cancer Sister   . Cancer Maternal Grandmother   . Lung cancer Paternal Grandfather   . Migraines Other        Mfhx of migraine  . Seizures Cousin   . Autism Cousin     No Known Allergies  Current Outpatient Medications on File Prior to Visit  Medication Sig Dispense Refill  . amitriptyline (ELAVIL) 25 MG tablet Take 1 tablet (25 mg total) by mouth at bedtime. 90 tablet 2  . aspirin-acetaminophen-caffeine (EXCEDRIN MIGRAINE) 250-250-65 MG tablet Take 2 tablets by mouth every 6 (six) hours as needed for headache.    . fluticasone (FLONASE) 50 MCG/ACT nasal spray Place 1 spray into both nostrils daily.    . hydrOXYzine (ATARAX/VISTARIL) 25 MG tablet TAKE 1/2 TO 1 TABLET BY MOUTH 3 TIMES A DAY AS NEEDED FOR ANXIETY 30 tablet 0  . ibuprofen (ADVIL,MOTRIN) 800 MG tablet     . propranolol (INDERAL) 20 MG tablet Take 1 tablet (20 mg total) by mouth at bedtime. 90 tablet 0  . SUMAtriptan (IMITREX) 50 MG tablet TAKE 1 TABLET WITH 400-600 MG OF IBUPROFEN FOR MODERATE TO SEVERE HEADACHE, MAXIMUM 2 TIMES A WEEK 10 tablet 0  . venlafaxine XR (EFFEXOR-XR) 150 MG 24 hr capsule TAKE 1 CAPSULE(150 MG) BY MOUTH DAILY WITH BREAKFAST 90 capsule 1   No current  facility-administered medications on file prior to visit.     There were no vitals taken for this visit.   Objective:   Physical Exam  Constitutional: She is oriented to person, place, and time. She appears well-nourished. She does not have a sickly appearance. She does not appear ill.  Respiratory: Effort normal. No respiratory distress.  Neurological: She is alert and oriented to person, place, and time.  Psychiatric: She has a normal mood and affect.           Assessment & Plan:

## 2019-05-22 NOTE — Assessment & Plan Note (Signed)
Low TSH in July 2019, repeat testing from September 2019 unremarkable. Repeat TSH and Free T4 pending.

## 2019-05-24 ENCOUNTER — Other Ambulatory Visit (INDEPENDENT_AMBULATORY_CARE_PROVIDER_SITE_OTHER): Payer: 59

## 2019-05-24 DIAGNOSIS — R946 Abnormal results of thyroid function studies: Secondary | ICD-10-CM

## 2019-05-24 DIAGNOSIS — R110 Nausea: Secondary | ICD-10-CM

## 2019-05-24 NOTE — Addendum Note (Signed)
Addended by: Ellamae Sia on: 05/24/2019 09:07 AM   Modules accepted: Orders

## 2019-05-25 LAB — CBC WITH DIFFERENTIAL/PLATELET
Basophils Absolute: 0.1 10*3/uL (ref 0.0–0.2)
Basos: 1 %
EOS (ABSOLUTE): 0.1 10*3/uL (ref 0.0–0.4)
Eos: 1 %
Hematocrit: 39.7 % (ref 34.0–46.6)
Hemoglobin: 13.3 g/dL (ref 11.1–15.9)
Immature Grans (Abs): 0 10*3/uL (ref 0.0–0.1)
Immature Granulocytes: 0 %
Lymphocytes Absolute: 2.6 10*3/uL (ref 0.7–3.1)
Lymphs: 44 %
MCH: 28.9 pg (ref 26.6–33.0)
MCHC: 33.5 g/dL (ref 31.5–35.7)
MCV: 86 fL (ref 79–97)
Monocytes Absolute: 0.6 10*3/uL (ref 0.1–0.9)
Monocytes: 11 %
Neutrophils Absolute: 2.5 10*3/uL (ref 1.4–7.0)
Neutrophils: 43 %
Platelets: 361 10*3/uL (ref 150–450)
RBC: 4.6 x10E6/uL (ref 3.77–5.28)
RDW: 11.9 % (ref 11.7–15.4)
WBC: 5.7 10*3/uL (ref 3.4–10.8)

## 2019-05-25 LAB — BASIC METABOLIC PANEL
BUN/Creatinine Ratio: 16 (ref 9–23)
BUN: 12 mg/dL (ref 6–20)
CO2: 21 mmol/L (ref 20–29)
Calcium: 9.7 mg/dL (ref 8.7–10.2)
Chloride: 103 mmol/L (ref 96–106)
Creatinine, Ser: 0.75 mg/dL (ref 0.57–1.00)
GFR calc Af Amer: 135 mL/min/{1.73_m2} (ref >59)
GFR calc non Af Amer: 117 mL/min/{1.73_m2} (ref >59)
Glucose: 78 mg/dL (ref 65–99)
Potassium: 4.6 mmol/L (ref 3.5–5.2)
Sodium: 143 mmol/L (ref 134–144)

## 2019-05-25 LAB — T4, FREE: Free T4: 1.02 ng/dL (ref 0.93–1.60)

## 2019-05-25 LAB — TSH: TSH: 0.928 u[IU]/mL (ref 0.450–4.500)

## 2019-07-06 ENCOUNTER — Other Ambulatory Visit: Payer: Self-pay | Admitting: Primary Care

## 2019-07-06 DIAGNOSIS — F32A Depression, unspecified: Secondary | ICD-10-CM

## 2019-07-06 DIAGNOSIS — F329 Major depressive disorder, single episode, unspecified: Secondary | ICD-10-CM

## 2019-07-06 DIAGNOSIS — F419 Anxiety disorder, unspecified: Secondary | ICD-10-CM

## 2019-09-05 ENCOUNTER — Telehealth: Payer: Self-pay

## 2019-09-05 ENCOUNTER — Other Ambulatory Visit: Payer: Self-pay

## 2019-09-05 DIAGNOSIS — Z20822 Contact with and (suspected) exposure to covid-19: Secondary | ICD-10-CM

## 2019-09-05 NOTE — Telephone Encounter (Signed)
Noted, agree to Covid-testing. Have her follow up if symptoms persist and Covid test negative.

## 2019-09-05 NOTE — Telephone Encounter (Signed)
Pt called back and will go for covid testing today at either [facility] or [facility] visitor entrance. ED precautions and quarantine info given and pt voiced understanding. FYI to Gentry Fitz NP.

## 2019-09-05 NOTE — Telephone Encounter (Signed)
Pt said last night pt had bad H/a with nausea, chills and shaky while at work last night(pt works in [employer] and pt wears mask and washes hands often when at work), no H/A today but having diarrhea and loss of taste; pt is drinking a cup of coffee and it is tasteless like drinking water. 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 Pt is going to call her mom to see if she is going to UC or wants [facility] to order covid test. I advised pt she should self quarantine until gets covid testing results. Pt will cb after talks with her mom to advise what she is going to do. FYI to Gentry Fitz NP.

## 2019-09-06 LAB — NOVEL CORONAVIRUS, NAA: SARS-CoV-2, NAA: NOT DETECTED

## 2019-09-06 NOTE — Telephone Encounter (Signed)
Spoken and notified patient of Kate Clark's comments. Patient verbalized understanding.  

## 2019-09-13 ENCOUNTER — Other Ambulatory Visit: Payer: Self-pay

## 2019-09-13 ENCOUNTER — Telehealth: Payer: Self-pay | Admitting: Primary Care

## 2019-09-13 ENCOUNTER — Ambulatory Visit: Payer: 59 | Admitting: Primary Care

## 2019-09-13 ENCOUNTER — Encounter: Payer: Self-pay | Admitting: Primary Care

## 2019-09-13 VITALS — BP 120/80 | HR 117 | Temp 97.8°F | Ht 65.0 in | Wt 125.5 lb

## 2019-09-13 DIAGNOSIS — F419 Anxiety disorder, unspecified: Secondary | ICD-10-CM

## 2019-09-13 DIAGNOSIS — G43009 Migraine without aura, not intractable, without status migrainosus: Secondary | ICD-10-CM | POA: Diagnosis not present

## 2019-09-13 DIAGNOSIS — N3020 Other chronic cystitis without hematuria: Secondary | ICD-10-CM

## 2019-09-13 DIAGNOSIS — Z23 Encounter for immunization: Secondary | ICD-10-CM

## 2019-09-13 DIAGNOSIS — R519 Headache, unspecified: Secondary | ICD-10-CM

## 2019-09-13 DIAGNOSIS — G8929 Other chronic pain: Secondary | ICD-10-CM

## 2019-09-13 DIAGNOSIS — F329 Major depressive disorder, single episode, unspecified: Secondary | ICD-10-CM

## 2019-09-13 DIAGNOSIS — F32A Depression, unspecified: Secondary | ICD-10-CM

## 2019-09-13 MED ORDER — PROPRANOLOL HCL 20 MG PO TABS: 20.0000 mg | ORAL_TABLET | Freq: Every day | ORAL | 3 refills | Status: DC

## 2019-09-13 NOTE — Assessment & Plan Note (Signed)
Following with Urology, using Cipro PRN.

## 2019-09-13 NOTE — Progress Notes (Signed)
Subjective:    Patient ID: Andrea Wilson, female    DOB: 03/09/2001, 18 y.o.   MRN: [id number]  HPI  Andrea Wilson is an 18 year old female who presents today for follow up of chronic conditions.  1) Migraines/Headaches: Previously following with pediatric neurology for chronic headaches, has now aged out. Currently managed on Propranolol HS and Amitriptyline 25 mg HS with sumatriptan PRN.   She's not had to use her sumatriptan in quite some time as it causes GI upset. Overall she feels well managed on her current regimen. She is needing refills of propranolol as she ran out about one month ago. Since running out she's noticed an increase in headache frequency.   2) Anxiety and Depression: Currently managed on venlafaxine ER 150 mg daily. Overall she feels well managed on venlafaxine ER 150 mg, but has had increased stress as she is overworking herself. 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 Only has Sunday off. She is thinking about quitting her day job as it's too much.   3) Chronic Cystitis: Currently following with Urology through [facility] with last visit in July 2020 where she underwent cystoscopy. She has a prescription for ciprofloxacin 500 mg tablets to use as prophylaxis if she feels a UTI coming.   Her last UTI was around early Summer 2020.  Review of Systems  Respiratory: Negative for shortness of breath.   Cardiovascular: Negative for chest pain and palpitations.  Genitourinary: Negative for dysuria.  Neurological: Positive for headaches.  Psychiatric/Behavioral:       Increased stress recently. See HPI       Past Medical History:  Diagnosis Date  . Allergy   . Anxiety   . Depression   . Frequent headaches   . Hypotension      Social History   Socioeconomic History  . Marital status: Single    Spouse name: Not on file  . Number of children: Not on file  . Years of education: Not on file  . Highest education  level: Not on file  Occupational History  . Not on file  Social Needs  . Financial resource strain: Not on file  . Food insecurity    Worry: Not on file    Inability: Not on file  . Transportation needs    Medical: Not on file    Non-medical: Not on file  Tobacco Use  . Smoking status: Never Smoker  . Smokeless tobacco: Never Used  Substance and Sexual Activity  . Alcohol use: No  . Drug use: No  . Sexual activity: Never  Lifestyle  . Physical activity    Days per week: Not on file    Minutes per session: Not on file  . Stress: Not on file  Relationships  . Social Musician on phone: Not on file    Gets together: Not on file    Attends religious service: Not on file    Active member of club or organization: Not on file    Attends meetings of clubs or organizations: Not on file    Relationship status: Not on file  . Intimate partner violence    Fear of current or ex partner: Not on file    Emotionally abused: Not on file    Physically abused: Not on file    Forced sexual activity: Not on file  Other Topics Concern  . Not on file  Social History Narrative  12th grade student at  [employer]. She does well in school.   Lives with mother and brother. Her sister passed away in March 22, 2013 at age 43 from cancer.   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     Past Surgical History:  Procedure Laterality Date  . KNEE SURGERY    . TOOTH EXTRACTION      Family History  Problem Relation Age of Onset  . Hyperlipidemia Mother   . Hypertension Father   . Cancer Sister   . Cancer Maternal Grandmother   . Lung cancer Paternal Grandfather   . Migraines Other        Mfhx of migraine  . Seizures Cousin   . Autism Cousin     No Known Allergies  Current Outpatient Medications on File Prior to Visit  Medication Sig Dispense Refill  . amitriptyline (ELAVIL) 25 MG tablet Take 1 tablet (25 mg total) by mouth at bedtime. 90 tablet 2  .  aspirin-acetaminophen-caffeine (EXCEDRIN MIGRAINE) 250-250-65 MG tablet Take 2 tablets by mouth every 6 (six) hours as needed for headache.    . ciprofloxacin (CIPRO) 500 MG tablet Take 500 mg by mouth once. As needed for UTI prophylaxis.    . fluticasone (FLONASE) 50 MCG/ACT nasal spray Place 1 spray into both nostrils daily.    . hydrOXYzine (ATARAX/VISTARIL) 25 MG tablet TAKE 1/2 TO 1 TABLET BY MOUTH 3 TIMES A DAY AS NEEDED FOR ANXIETY 30 tablet 0  . ibuprofen (ADVIL,MOTRIN) 800 MG tablet     . SUMAtriptan (IMITREX) 50 MG tablet TAKE 1 TABLET WITH 400-600 MG OF IBUPROFEN FOR MODERATE TO SEVERE HEADACHE, MAXIMUM 2 TIMES A WEEK 10 tablet 0  . venlafaxine XR (EFFEXOR-XR) 150 MG 24 hr capsule TAKE 1 CAPSULE(150 MG) BY MOUTH DAILY WITH BREAKFAST 90 capsule 1   No current facility-administered medications on file prior to visit.     BP 120/80   Pulse (!) 117   Temp 97.8 F (36.6 C) (Temporal)   Ht 5\' 5"  (1.651 m)   Wt 125 lb 8 oz (56.9 kg)   LMP 09/13/2019   SpO2 97%   BMI 20.88 kg/m    Objective:   Physical Exam  Constitutional: She appears well-nourished.  Neck: Neck supple.  Cardiovascular: Normal rate and regular rhythm.  Respiratory: Effort normal and breath sounds normal.  Skin: Skin is warm and dry.  Psychiatric: She has a normal mood and affect.           Assessment & Plan:

## 2019-09-13 NOTE — Assessment & Plan Note (Signed)
No longer following with pediatric neurology as she has aged out. Doing well on current regimen of propranolol and amitriptyline, refills provided.

## 2019-09-13 NOTE — Addendum Note (Signed)
Addended by: Jacqualin Combes on: 09/13/2019 10:00 AM   Modules accepted: Orders

## 2019-09-13 NOTE — Telephone Encounter (Signed)
Spoken to patient's mom this morning and change Rx to [facility] as requested.

## 2019-09-13 NOTE — Assessment & Plan Note (Signed)
Overall feels well managed on venlafaxine ER 150 mg. Continue current regimen. Declines therapy.

## 2019-09-13 NOTE — Telephone Encounter (Signed)
Wendy(mom) called pt rx needs to [facility] [place] it has 90 supply for insurance to pay  Best number [phone]

## 2019-09-13 NOTE — Assessment & Plan Note (Signed)
Infrequent migraines, using sumatriptan infrequently. Continue preventative treatment.

## 2019-09-13 NOTE — Patient Instructions (Signed)
I sent refills of propranolol to the pharmacy. Please notify me when you need the amitriptyline.  It was a pleasure to see you today!

## 2019-09-24 ENCOUNTER — Encounter: Payer: Self-pay | Admitting: Primary Care

## 2019-09-24 ENCOUNTER — Other Ambulatory Visit: Payer: Self-pay

## 2019-09-24 ENCOUNTER — Ambulatory Visit: Payer: 59 | Admitting: Primary Care

## 2019-09-24 VITALS — BP 122/84 | HR 104 | Temp 97.7°F | Ht 65.0 in | Wt 127.0 lb

## 2019-09-24 DIAGNOSIS — N898 Other specified noninflammatory disorders of vagina: Secondary | ICD-10-CM | POA: Diagnosis not present

## 2019-09-24 NOTE — Patient Instructions (Signed)
We will be in touch with your specimen results once received.  Avoid baths, hot tubs for the time being.  It was a pleasure to see you today!

## 2019-09-24 NOTE — Assessment & Plan Note (Addendum)
Also with discharge that is not typical. She denies sexual activity. Exam today suspicious for vaginal yeast infection. Wet prep collected and is pending. Await results.

## 2019-09-24 NOTE — Progress Notes (Signed)
Subjective:    Patient ID: Andrea Wilson, female    DOB: 07-Oct-2001, 18 y.o.   MRN: [id number]  HPI  Andrea Wilson is a 18 year old female with a history of hemorrhoids, chronic cystitis, anxiety who presents today with a chief complaint of vaginal discharge.  She also reports vaginal itching. Her symptoms began about 10 days ago. She typically has vaginal discharge that is whitish, typically wears a pad and has seen GYN for this, was told that there was nothing to do for treatment. She forgot to change her pad at the time and sat in her discharge for a long period of time. Since then her symptoms of itching, increased discharge began.  She's tried two courses of Monistat, did notice a change in discharge color to green, this has since resolved. Symptoms have improved slightly.  One week ago she took two days of her Cipro antibiotics for recurrent UTI and had no improvement.   She denies urinary frequency, dysuria, hematuria. She is not sexually active.   Review of Systems  Constitutional: Negative for fever.  Gastrointestinal: Negative for abdominal pain.  Genitourinary: Positive for vaginal discharge. Negative for dysuria, frequency, hematuria and pelvic pain.       Past Medical History:  Diagnosis Date  . Allergy   . Anxiety   . Depression   . Frequent headaches   . Hypotension      Social History   Socioeconomic History  . Marital status: Single    Spouse name: Not on file  . Number of children: Not on file  . Years of education: Not on file  . Highest education level: Not on file  Occupational History  . Not on file  Social Needs  . Financial resource strain: Not on file  . Food insecurity    Worry: Not on file    Inability: Not on file  . Transportation needs    Medical: Not on file    Non-medical: Not on file  Tobacco Use  . Smoking status: Never Smoker  . Smokeless tobacco: Never Used  Substance and Sexual Activity  . Alcohol use: No  . Drug use: No   . Sexual activity: Never  Lifestyle  . Physical activity    Days per week: Not on file    Minutes per session: Not on file  . Stress: Not on file  Relationships  . Social Herbalist on phone: Not on file    Gets together: Not on file    Attends religious service: Not on file    Active member of club or organization: Not on file    Attends meetings of clubs or organizations: Not on file    Relationship status: Not on file  . Intimate partner violence    Fear of current or ex partner: Not on file    Emotionally abused: Not on file    Physically abused: Not on file    Forced sexual activity: Not on file  Other Topics Concern  . Not on file  Social History Narrative   12th grade student at  [employer]. She does well in school.   Lives with mother and brother. Her sister passed away in 03-27-13 at age 82 from cancer.   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     Past Surgical History:  Procedure Laterality Date  . KNEE SURGERY    . TOOTH EXTRACTION      Family History  Problem Relation  Age of Onset  . Hyperlipidemia Mother   . Hypertension Father   . Cancer Sister   . Cancer Maternal Grandmother   . Lung cancer Paternal Grandfather   . Migraines Other        Mfhx of migraine  . Seizures Cousin   . Autism Cousin     No Known Allergies  Current Outpatient Medications on File Prior to Visit  Medication Sig Dispense Refill  . amitriptyline (ELAVIL) 25 MG tablet Take 1 tablet (25 mg total) by mouth at bedtime. 90 tablet 2  . aspirin-acetaminophen-caffeine (EXCEDRIN MIGRAINE) 250-250-65 MG tablet Take 2 tablets by mouth every 6 (six) hours as needed for headache.    . fluticasone (FLONASE) 50 MCG/ACT nasal spray Place 1 spray into both nostrils daily.    . hydrOXYzine (ATARAX/VISTARIL) 25 MG tablet TAKE 1/2 TO 1 TABLET BY MOUTH 3 TIMES A DAY AS NEEDED FOR ANXIETY 30 tablet 0  . ibuprofen (ADVIL,MOTRIN) 800 MG tablet     . propranolol (INDERAL) 20 MG  tablet Take 1 tablet (20 mg total) by mouth at bedtime. For headache prevention. 90 tablet 3  . SUMAtriptan (IMITREX) 50 MG tablet TAKE 1 TABLET WITH 400-600 MG OF IBUPROFEN FOR MODERATE TO SEVERE HEADACHE, MAXIMUM 2 TIMES A WEEK 10 tablet 0  . venlafaxine XR (EFFEXOR-XR) 150 MG 24 hr capsule TAKE 1 CAPSULE(150 MG) BY MOUTH DAILY WITH BREAKFAST 90 capsule 1  . ciprofloxacin (CIPRO) 500 MG tablet Take 500 mg by mouth once. As needed for UTI prophylaxis.     No current facility-administered medications on file prior to visit.     BP 122/84   Pulse (!) 104   Temp 97.7 F (36.5 C) (Temporal)   Ht 5\' 5"  (1.651 m)   Wt 127 lb (57.6 kg)   LMP 09/13/2019   SpO2 95%   BMI 21.13 kg/m    Objective:   Physical Exam  Constitutional: She appears well-nourished.  GI: Soft. Bowel sounds are normal. There is no abdominal tenderness.  Genitourinary: There is no tenderness or lesion on the right labia. There is no tenderness or lesion on the left labia. Cervix exhibits discharge. Cervix exhibits no motion tenderness.    Vaginal discharge present.     No vaginal erythema.  No erythema in the vagina.    Genitourinary Comments: Moderate amount of thick whitish discharge. No foul smell.            Assessment & Plan:

## 2019-09-25 LAB — WET PREP BY MOLECULAR PROBE
Candida species: NOT DETECTED
Gardnerella vaginalis: NOT DETECTED
MICRO NUMBER:: 1009265
SPECIMEN QUALITY:: ADEQUATE
Trichomonas vaginosis: NOT DETECTED

## 2019-12-22 DIAGNOSIS — G8929 Other chronic pain: Secondary | ICD-10-CM

## 2019-12-22 DIAGNOSIS — G43009 Migraine without aura, not intractable, without status migrainosus: Secondary | ICD-10-CM

## 2019-12-22 DIAGNOSIS — R519 Headache, unspecified: Secondary | ICD-10-CM

## 2019-12-24 MED ORDER — AMITRIPTYLINE HCL 25 MG PO TABS: 25.0000 mg | ORAL_TABLET | Freq: Every day | ORAL | 2 refills | Status: DC

## 2020-01-02 ENCOUNTER — Other Ambulatory Visit: Payer: Self-pay | Admitting: Primary Care

## 2020-01-02 DIAGNOSIS — F32A Depression, unspecified: Secondary | ICD-10-CM

## 2020-01-02 DIAGNOSIS — F329 Major depressive disorder, single episode, unspecified: Secondary | ICD-10-CM

## 2020-01-08 ENCOUNTER — Ambulatory Visit: Payer: 59 | Admitting: Primary Care

## 2020-01-08 ENCOUNTER — Encounter: Payer: Self-pay | Admitting: Primary Care

## 2020-01-08 ENCOUNTER — Other Ambulatory Visit: Payer: Self-pay

## 2020-01-08 VITALS — BP 120/70 | HR 121 | Temp 96.6°F | Ht 65.0 in | Wt 129.5 lb

## 2020-01-08 DIAGNOSIS — F329 Major depressive disorder, single episode, unspecified: Secondary | ICD-10-CM | POA: Diagnosis not present

## 2020-01-08 DIAGNOSIS — F419 Anxiety disorder, unspecified: Secondary | ICD-10-CM | POA: Diagnosis not present

## 2020-01-08 DIAGNOSIS — F32A Depression, unspecified: Secondary | ICD-10-CM

## 2020-01-08 NOTE — Assessment & Plan Note (Signed)
Well controlled until just recently. She is already on a decent dose of venlafaxine at 150 mg and amitriptyline 25 mg HS for migraine prevention.  Unclear cause for her symptoms during this incident. Could be re-initiation of OCP's but also wonder if she has an undiagnosed psychiatric disorder.  Given her age of 55 I am reticent to add on anything else to her regimen. We will refer her to psychiatry for further evaluation. She agrees.

## 2020-01-08 NOTE — Progress Notes (Signed)
Subjective:    Patient ID: Andrea Wilson, female    DOB: 11-12-2001, 19 y.o.   MRN: [id number]  HPI  This visit occurred during the SARS-CoV-2 public health emergency.  Safety protocols were in place, including screening questions prior to the visit, additional usage of staff PPE, and extensive cleaning of exam room while observing appropriate contact time as indicated for disinfecting solutions.   [name] is a 19 year old female with a history of migraines, chronic cystitis, anxiety and depression who presents today   She is currently managed on venlafaxine XR 150 mg daily for anxiety and depression and amitriptyline 25 mg HS for headache prevention. Up until recently she's been doing well on her current regimen. She recently started cosmetology school and is very excited about this. Also recently got a new job at [employer] but had an incident.   She had an incident at work a few days ago, accidentally broke a glass, found herself very upset and couldn't stop crying. Her fellow employees became very aggravated with her which made her feel worse which caused her to cry harder. She had to walk out of work that day due to crying and her fellow employees followed her outside and nagged her with questions. The nagging made her feel more anxious and feel worse so she quit on the spot. She's not really sure what brought on these symptoms as she understands that breaking a glass is no big deal.   Since this incident she's been feeling down as she has to find a new job, her dog has to get an ultrasound. Prior to this incident she was doing well on her current regimen. She was once on Prozac which wasn't very effective.  Symptoms now include feeling sad, wanting to sleep. She started birth control a few weeks ago, has been on OCP's in the past without problems.   Review of Systems  Respiratory: Negative for shortness of breath.   Cardiovascular: Negative for chest pain.    Psychiatric/Behavioral:       See HPI       Past Medical History:  Diagnosis Date  . Allergy   . Anxiety   . Depression   . Frequent headaches   . Hypotension      Social History   Socioeconomic History  . Marital status: Single    Spouse name: Not on file  . Number of children: Not on file  . Years of education: Not on file  . Highest education level: Not on file  Occupational History  . Not on file  Tobacco Use  . Smoking status: Never Smoker  . Smokeless tobacco: Never Used  Substance and Sexual Activity  . Alcohol use: No  . Drug use: No  . Sexual activity: Never  Other Topics Concern  . Not on file  Social History Narrative   12th grade student at  [employer]. She does well in school.   Lives with mother and brother. Her sister passed away in 04-09-2013 at age 9 from cancer.   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    Social Determinants of Health   Financial Resource Strain:   . Difficulty of Paying Living Expenses: Not on file  Food Insecurity:   . Worried About Charity fundraiser in the Last Year: Not on file  . Ran Out of Food in the Last Year: Not on file  Transportation Needs:   . Lack of Transportation (Medical): Not on  file  . Lack of Transportation (Non-Medical): Not on file  Physical Activity:   . Days of Exercise per Week: Not on file  . Minutes of Exercise per Session: Not on file  Stress:   . Feeling of Stress : Not on file  Social Connections:   . Frequency of Communication with Friends and Family: Not on file  . Frequency of Social Gatherings with Friends and Family: Not on file  . Attends Religious Services: Not on file  . Active Member of Clubs or Organizations: Not on file  . Attends Banker Meetings: Not on file  . Marital Status: Not on file  Intimate Partner Violence:   . Fear of Current or Ex-Partner: Not on file  . Emotionally Abused: Not on file  . Physically Abused: Not on file  . Sexually Abused:  Not on file    Past Surgical History:  Procedure Laterality Date  . KNEE SURGERY    . TOOTH EXTRACTION      Family History  Problem Relation Age of Onset  . Hyperlipidemia Mother   . Hypertension Father   . Cancer Sister   . Cancer Maternal Grandmother   . Lung cancer Paternal Grandfather   . Migraines Other        Mfhx of migraine  . Seizures Cousin   . Autism Cousin     No Known Allergies  Current Outpatient Medications on File Prior to Visit  Medication Sig Dispense Refill  . amitriptyline (ELAVIL) 25 MG tablet Take 1 tablet (25 mg total) by mouth at bedtime. For migraine prevention. 90 tablet 2  . aspirin-acetaminophen-caffeine (EXCEDRIN MIGRAINE) 250-250-65 MG tablet Take 2 tablets by mouth every 6 (six) hours as needed for headache.    . ciprofloxacin (CIPRO) 500 MG tablet Take 500 mg by mouth once. As needed for UTI prophylaxis.    . fluticasone (FLONASE) 50 MCG/ACT nasal spray Place 1 spray into both nostrils daily.    . hydrOXYzine (ATARAX/VISTARIL) 25 MG tablet TAKE 1/2 TO 1 TABLET BY MOUTH 3 TIMES A DAY AS NEEDED FOR ANXIETY 30 tablet 0  . ibuprofen (ADVIL,MOTRIN) 800 MG tablet     . propranolol (INDERAL) 20 MG tablet Take 1 tablet (20 mg total) by mouth at bedtime. For headache prevention. 90 tablet 3  . SUMAtriptan (IMITREX) 50 MG tablet TAKE 1 TABLET WITH 400-600 MG OF IBUPROFEN FOR MODERATE TO SEVERE HEADACHE, MAXIMUM 2 TIMES A WEEK 10 tablet 0  . venlafaxine XR (EFFEXOR-XR) 150 MG 24 hr capsule TAKE 1 CAPSULE(150 MG) BY MOUTH DAILY WITH BREAKFAST 90 capsule 1  . AUROVELA 24 FE 1-20 MG-MCG(24) tablet Take 1 tablet by mouth daily.     No current facility-administered medications on file prior to visit.    BP 120/70   Pulse (!) 121   Temp (!) 96.6 F (35.9 C) (Temporal)   Ht 5\' 5"  (1.651 m)   Wt 129 lb 8 oz (58.7 kg)   LMP 01/05/2020   SpO2 98%   BMI 21.55 kg/m    Objective:   Physical Exam  Constitutional: She appears well-nourished.    Cardiovascular: Normal rate and regular rhythm.  Respiratory: Effort normal and breath sounds normal.  Musculoskeletal:     Cervical back: Neck supple.  Skin: Skin is warm and dry.  Psychiatric: She has a normal mood and affect.           Assessment & Plan:

## 2020-01-08 NOTE — Patient Instructions (Signed)
You will be contacted regarding your referral to psychiatry.  Please let us know if you have not been contacted within two weeks.   Continue venlafaxine ER 150 mg daily.  It was a pleasure to see you today!

## 2020-01-13 NOTE — Telephone Encounter (Signed)
Could one of you guys let this patient know about the timeline regarding referral to psychiatry. I believe Jae Dire suggested Dr Maryruth Bun.

## 2020-01-15 NOTE — Telephone Encounter (Signed)
Referral and OV notes faxed to Dr. Shelda Altes office.  Pt aware they will review notes and if accepted as pt their office will call her to schedule.

## 2020-02-25 NOTE — Progress Notes (Deleted)
Psychiatric Initial Adult Assessment   Patient Identification: Andrea Wilson MRN:  [id number] Date of Evaluation:  02/25/2020 Referral Source: Doreene Nest, NP Chief Complaint:   Visit Diagnosis: No diagnosis found.  History of Present Illness:   Andrea Wilson is a 19 y.o. year old female with a history of anxiety, depression, migraine, who is referred for depression.    Associated Signs/Symptoms: Depression Symptoms:  {DEPRESSION SYMPTOMS:20000} (Hypo) Manic Symptoms:  {BHH MANIC SYMPTOMS:22872} Anxiety Symptoms:  {BHH ANXIETY SYMPTOMS:22873} Psychotic Symptoms:  {BHH PSYCHOTIC SYMPTOMS:22874} PTSD Symptoms: {BHH PTSD SYMPTOMS:22875}  Past Psychiatric History:  Outpatient:  Psychiatry admission:  Previous suicide attempt:  Past trials of medication:  History of violence:   Previous Psychotropic Medications: {YES/NO:21197}  Substance Abuse History in the last 12 months:  {yes no:314532}  Consequences of Substance Abuse: {BHH CONSEQUENCES OF SUBSTANCE ABUSE:22880}  Past Medical History:  Past Medical History:  Diagnosis Date  . Allergy   . Anxiety   . Depression   . Frequent headaches   . Hypotension     Past Surgical History:  Procedure Laterality Date  . KNEE SURGERY    . TOOTH EXTRACTION      Family Psychiatric History: ***  Family History:  Family History  Problem Relation Age of Onset  . Hyperlipidemia Mother   . Hypertension Father   . Cancer Sister   . Cancer Maternal Grandmother   . Lung cancer Paternal Grandfather   . Migraines Other        Mfhx of migraine  . Seizures Cousin   . Autism Cousin     Social History:   Social History   Socioeconomic History  . Marital status: Single    Spouse name: Not on file  . Number of children: Not on file  . Years of education: Not on file  . Highest education level: Not on file  Occupational History  . Not on file  Tobacco Use  . Smoking status: Never Smoker  . Smokeless tobacco:  Never Used  Substance and Sexual Activity  . Alcohol use: No  . Drug use: No  . Sexual activity: Never  Other Topics Concern  . Not on file  Social History Narrative   12th grade student at  [employer]. She does well in school.   Lives with mother and brother. Her sister passed away in 04-16-13 at age 1 from cancer.   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    Social Determinants of Health   Financial Resource Strain:   . Difficulty of Paying Living Expenses:   Food Insecurity:   . Worried About Programme researcher, broadcasting/film/video in the Last Year:   . Barista in the Last Year:   Transportation Needs:   . Freight forwarder (Medical):   Marland Kitchen Lack of Transportation (Non-Medical):   Physical Activity:   . Days of Exercise per Week:   . Minutes of Exercise per Session:   Stress:   . Feeling of Stress :   Social Connections:   . Frequency of Communication with Friends and Family:   . Frequency of Social Gatherings with Friends and Family:   . Attends Religious Services:   . Active Member of Clubs or Organizations:   . Attends Banker Meetings:   Marland Kitchen Marital Status:     Additional Social History: ***  Allergies:  No Known Allergies  Metabolic Disorder Labs: No results found for: HGBA1C, MPG No results found for: PROLACTIN No  results found for: CHOL, TRIG, HDL, CHOLHDL, VLDL, LDLCALC Lab Results  Component Value Date   TSH 0.928 05/24/2019    Therapeutic Level Labs: No results found for: LITHIUM No results found for: CBMZ No results found for: VALPROATE  Current Medications: Current Outpatient Medications  Medication Sig Dispense Refill  . amitriptyline (ELAVIL) 25 MG tablet Take 1 tablet (25 mg total) by mouth at bedtime. For migraine prevention. 90 tablet 2  . aspirin-acetaminophen-caffeine (EXCEDRIN MIGRAINE) 250-250-65 MG tablet Take 2 tablets by mouth every 6 (six) hours as needed for headache.    . AUROVELA 24 FE 1-20 MG-MCG(24) tablet Take 1  tablet by mouth daily.    . ciprofloxacin (CIPRO) 500 MG tablet Take 500 mg by mouth once. As needed for UTI prophylaxis.    . fluticasone (FLONASE) 50 MCG/ACT nasal spray Place 1 spray into both nostrils daily.    . hydrOXYzine (ATARAX/VISTARIL) 25 MG tablet TAKE 1/2 TO 1 TABLET BY MOUTH 3 TIMES A DAY AS NEEDED FOR ANXIETY 30 tablet 0  . ibuprofen (ADVIL,MOTRIN) 800 MG tablet     . propranolol (INDERAL) 20 MG tablet Take 1 tablet (20 mg total) by mouth at bedtime. For headache prevention. 90 tablet 3  . SUMAtriptan (IMITREX) 50 MG tablet TAKE 1 TABLET WITH 400-600 MG OF IBUPROFEN FOR MODERATE TO SEVERE HEADACHE, MAXIMUM 2 TIMES A WEEK 10 tablet 0  . venlafaxine XR (EFFEXOR-XR) 150 MG 24 hr capsule TAKE 1 CAPSULE(150 MG) BY MOUTH DAILY WITH BREAKFAST 90 capsule 1   No current facility-administered medications for this visit.    Musculoskeletal: Strength & Muscle Tone: N/A Gait & Station: N/A Patient leans: N/A  Psychiatric Specialty Exam: Review of Systems  There were no vitals taken for this visit.There is no height or weight on file to calculate BMI.  General Appearance: {Appearance:22683}  Eye Contact:  {BHH EYE CONTACT:22684}  Speech:  Clear and Coherent  Volume:  Normal  Mood:  {BHH MOOD:22306}  Affect:  {Affect (PAA):22687}  Thought Process:  Coherent  Orientation:  Full (Time, Place, and Person)  Thought Content:  Logical  Suicidal Thoughts:  {ST/HT (PAA):22692}  Homicidal Thoughts:  {ST/HT (PAA):22692}  Memory:  Immediate;   Good  Judgement:  {Judgement (PAA):22694}  Insight:  {Insight (PAA):22695}  Psychomotor Activity:  Normal  Concentration:  Concentration: Good and Attention Span: Good  Recall:  Good  Fund of Knowledge:Good  Language: Good  Akathisia:  No  Handed:  Right  AIMS (if indicated):  not done  Assets:  Communication Skills Desire for Improvement  ADL's:  Intact  Cognition: WNL  Sleep:  {BHH GOOD/FAIR/POOR:22877}   Screenings: GAD-7     Office  Visit from 06/13/2018 in [facility] Visit from 04/26/2018 in [facility] at [facility]  Total GAD-7 Score  6  18    PHQ2-9     Office Visit from 06/13/2018 in [facility] Visit from 04/26/2018 in [facility] Visit from 05/08/2017 in [facility] at [facility] Visit from 06/18/2015 in [facility] at [facility]  PHQ-2 Total Score  1  6  0  6  PHQ-9 Total Score  5  19  1  18       Assessment and Plan:  Assessment  Plan  The patient demonstrates the following risk factors for suicide: Chronic risk factors for suicide include: {Chronic Risk Factors for . Acute risk factors for suicide include: {Acute Risk Factors  for ZOXWRUE:45409811}. Protective factors for this patient include: {Protective Factors for Suicide BJYN:82956213}. Considering these factors, the overall suicide risk at this point appears to be {Desc; low/moderate/high:110033}. Patient {ACTION; IS/IS YQM:57846962} appropriate for outpatient follow up.   Norman Clay, MD 3/23/202112:49 PM

## 2020-02-25 NOTE — Telephone Encounter (Signed)
Spoke w/[facility].  They called pt and she is now scheduled for 02-29-20@2 :30 p.m. w/Dr. Vanetta Shawl.
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
## 2020-02-29 ENCOUNTER — Encounter (HOSPITAL_COMMUNITY): Payer: Self-pay | Admitting: Psychiatry

## 2020-02-29 ENCOUNTER — Other Ambulatory Visit: Payer: Self-pay

## 2020-02-29 ENCOUNTER — Ambulatory Visit (INDEPENDENT_AMBULATORY_CARE_PROVIDER_SITE_OTHER): Payer: 59 | Admitting: Psychiatry

## 2020-02-29 ENCOUNTER — Ambulatory Visit (HOSPITAL_COMMUNITY): Payer: Self-pay | Admitting: Psychiatry

## 2020-02-29 DIAGNOSIS — F411 Generalized anxiety disorder: Secondary | ICD-10-CM | POA: Diagnosis not present

## 2020-02-29 DIAGNOSIS — F330 Major depressive disorder, recurrent, mild: Secondary | ICD-10-CM

## 2020-02-29 NOTE — Patient Instructions (Addendum)
1. Recommend increasing venlafaxine 187.5 mg daily.  2. Discussed potential risk of serotonin syndrome with concomitant use of amitriptyline, sumatriptan 3. Consider referral to therapy   4. Return to clinic as needed

## 2020-06-06 ENCOUNTER — Other Ambulatory Visit: Payer: Self-pay | Admitting: Primary Care

## 2020-06-06 DIAGNOSIS — F32A Depression, unspecified: Secondary | ICD-10-CM

## 2020-06-06 DIAGNOSIS — F419 Anxiety disorder, unspecified: Secondary | ICD-10-CM

## 2020-06-16 ENCOUNTER — Encounter: Payer: Self-pay | Admitting: Primary Care

## 2020-06-16 ENCOUNTER — Other Ambulatory Visit: Payer: Self-pay

## 2020-06-16 ENCOUNTER — Ambulatory Visit: Payer: 59 | Admitting: Primary Care

## 2020-06-16 VITALS — BP 122/82 | HR 96 | Temp 96.1°F | Ht 65.0 in | Wt 131.0 lb

## 2020-06-16 DIAGNOSIS — R922 Inconclusive mammogram: Secondary | ICD-10-CM | POA: Diagnosis not present

## 2020-06-16 DIAGNOSIS — R9230 Dense breasts, unspecified: Secondary | ICD-10-CM

## 2020-06-16 DIAGNOSIS — M79622 Pain in left upper arm: Secondary | ICD-10-CM | POA: Diagnosis not present

## 2020-06-16 HISTORY — DX: Pain in left upper arm: M79.622

## 2020-06-16 NOTE — Progress Notes (Signed)
Subjective:    Patient ID: Andrea Wilson, female    DOB: 2001/01/04, 19 y.o.   MRN: [id number]  HPI  This visit occurred during the SARS-CoV-2 public health emergency.  Safety protocols were in place, including screening questions prior to the visit, additional usage of staff PPE, and extensive cleaning of exam room while observing appropriate contact time as indicated for disinfecting solutions.   Andrea Wilson is a 19 year old female with a history of anxiety and depression, chronic cystitis, chronic headaches who presents today with a chief complaint of axilla pain.   She's actually been experiencing the left axillary pain intermittently for the past two years, twice annually on average, but episodes are occurring more frequently now.  Her pain is located to the left axilla which initially began a few weeks ago,  lasted a few days and dissipated. Three days ago she noticed a return in pain which she described as sharp, sore pain, couldn't put deodorant on due to tenderness.   She denies lumps, rashes to her axilla, changes in soaps/detergents, strenuous activity. She has examined her breasts but she's not sure if she's noticed anything new.   She has a family history of bone cancer with metastasizes to the lungs in her younger sister. No known family history of breast cancer.    Review of Systems  Musculoskeletal: Negative for myalgias.       Left axillary pain  Skin: Negative for color change and wound.       Past Medical History:  Diagnosis Date  . Allergy   . Anxiety   . Depression   . Frequent headaches   . Hypotension      Social History   Socioeconomic History  . Marital status: Single    Spouse name: Not on file  . Number of children: Not on file  . Years of education: Not on file  . Highest education level: Not on file  Occupational History  . Not on file  Tobacco Use  . Smoking status: Never Smoker  . Smokeless tobacco: Never Used  Substance and Sexual  Activity  . Alcohol use: No  . Drug use: No  . Sexual activity: Never  Other Topics Concern  . Not on file  Social History Narrative   12th grade student at  [employer]. She does well in school.   Lives with mother and brother. Her sister passed away in 04/05/13 at age 45 from cancer.   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    Social Determinants of Health   Financial Resource Strain:   . Difficulty of Paying Living Expenses:   Food Insecurity:   . Worried About Programme researcher, broadcasting/film/video in the Last Year:   . Barista in the Last Year:   Transportation Needs:   . Freight forwarder (Medical):   Marland Kitchen Lack of Transportation (Non-Medical):   Physical Activity:   . Days of Exercise per Week:   . Minutes of Exercise per Session:   Stress:   . Feeling of Stress :   Social Connections:   . Frequency of Communication with Friends and Family:   . Frequency of Social Gatherings with Friends and Family:   . Attends Religious Services:   . Active Member of Clubs or Organizations:   . Attends Banker Meetings:   Marland Kitchen Marital Status:   Intimate Partner Violence:   . Fear of Current or Ex-Partner:   . Emotionally Abused:   .  Physically Abused:   . Sexually Abused:     Past Surgical History:  Procedure Laterality Date  . KNEE SURGERY    . TOOTH EXTRACTION      Family History  Problem Relation Age of Onset  . Hyperlipidemia Mother   . Anxiety disorder Mother   . Depression Mother   . Hypertension Father   . Cancer Sister   . Cancer Maternal Grandmother   . Lung cancer Paternal Grandfather   . Migraines Other        Mfhx of migraine  . Seizures Cousin   . Autism Cousin     No Known Allergies  Current Outpatient Medications on File Prior to Visit  Medication Sig Dispense Refill  . amitriptyline (ELAVIL) 25 MG tablet Take 1 tablet (25 mg total) by mouth at bedtime. For migraine prevention. 90 tablet 2  . aspirin-acetaminophen-caffeine (EXCEDRIN  MIGRAINE) 250-250-65 MG tablet Take 2 tablets by mouth every 6 (six) hours as needed for headache.    . AUROVELA 24 FE 1-20 MG-MCG(24) tablet Take 1 tablet by mouth daily.    . ciprofloxacin (CIPRO) 500 MG tablet Take 500 mg by mouth once. As needed for UTI prophylaxis.    . fluticasone (FLONASE) 50 MCG/ACT nasal spray Place 1 spray into both nostrils daily.    . hydrOXYzine (ATARAX/VISTARIL) 25 MG tablet TAKE 1/2 TO 1 TABLET BY MOUTH 3 TIMES A DAY AS NEEDED FOR ANXIETY 30 tablet 0  . ibuprofen (ADVIL,MOTRIN) 800 MG tablet     . propranolol (INDERAL) 20 MG tablet Take 1 tablet (20 mg total) by mouth at bedtime. For headache prevention. 90 tablet 3  . SUMAtriptan (IMITREX) 50 MG tablet TAKE 1 TABLET WITH 400-600 MG OF IBUPROFEN FOR MODERATE TO SEVERE HEADACHE, MAXIMUM 2 TIMES A WEEK 10 tablet 0  . venlafaxine XR (EFFEXOR-XR) 150 MG 24 hr capsule TAKE 1 CAPSULE(150 MG) BY MOUTH DAILY WITH BREAKFAST 90 capsule 1   No current facility-administered medications on file prior to visit.    BP 122/82   Pulse 96   Temp (!) 96.1 F (35.6 C) (Temporal)   Ht 5\' 5"  (1.651 m)   Wt 131 lb (59.4 kg)   LMP 05/31/2020   SpO2 97%   BMI 21.80 kg/m    Objective:   Physical Exam Chest:       Comments: Dense breast tissue noted to upper left breast around 11 o'clock to 1 o'clock positions. Non tender.  Skin:    General: Skin is warm and dry.     Findings: No erythema.     Comments: No lumps or abnormality to either axilla bilaterally.            Assessment & Plan:

## 2020-06-16 NOTE — Assessment & Plan Note (Addendum)
Intermittent for 2 years, more frequently occurring now. She does have very dense tissue to the left upper breast as noted. Bilateral axilla unremarkable.  Given family history and HPI, we will proceed with bilateral breast ultrasound.  There is no evidence of cellulitis, boils, skin color changes, other abnormality to axilla.

## 2020-06-16 NOTE — Patient Instructions (Addendum)
Stop by the front desk and speak with either Shirlee Limerick or Charmaine regarding your breast ultrasound.  It was a pleasure to see you today!

## 2020-06-29 ENCOUNTER — Other Ambulatory Visit: Payer: Self-pay | Admitting: Primary Care

## 2020-06-29 ENCOUNTER — Other Ambulatory Visit: Payer: Self-pay

## 2020-06-29 ENCOUNTER — Ambulatory Visit
Admission: RE | Admit: 2020-06-29 | Discharge: 2020-06-29 | Disposition: A | Discharge status: Home / Self Care | Payer: 59 | Source: Ambulatory Visit | Attending: Primary Care | Admitting: Primary Care

## 2020-06-29 DIAGNOSIS — M79622 Pain in left upper arm: Secondary | ICD-10-CM

## 2020-06-29 DIAGNOSIS — R922 Inconclusive mammogram: Secondary | ICD-10-CM

## 2020-07-09 DIAGNOSIS — G8929 Other chronic pain: Secondary | ICD-10-CM

## 2020-07-09 DIAGNOSIS — G43009 Migraine without aura, not intractable, without status migrainosus: Secondary | ICD-10-CM

## 2020-07-09 DIAGNOSIS — R519 Headache, unspecified: Secondary | ICD-10-CM

## 2020-07-13 MED ORDER — PROMETHAZINE HCL 12.5 MG PO TABS
12.5000 mg | ORAL_TABLET | Freq: Three times a day (TID) | ORAL | 0 refills | Status: DC | PRN
Start: 2020-07-13 — End: 2021-02-24

## 2020-07-13 MED ORDER — SUMATRIPTAN SUCCINATE 50 MG PO TABS: ORAL_TABLET | ORAL | 0 refills | Status: DC

## 2020-07-13 MED ORDER — AMITRIPTYLINE HCL 25 MG PO TABS: 25.0000 mg | ORAL_TABLET | Freq: Every day | ORAL | 0 refills | Status: DC

## 2020-07-22 ENCOUNTER — Other Ambulatory Visit: Payer: Self-pay | Admitting: Primary Care

## 2020-07-22 DIAGNOSIS — R519 Headache, unspecified: Secondary | ICD-10-CM

## 2020-12-05 DIAGNOSIS — U071 COVID-19: Secondary | ICD-10-CM

## 2020-12-05 HISTORY — DX: COVID-19: U07.1

## 2021-01-25 ENCOUNTER — Other Ambulatory Visit: Payer: Self-pay | Admitting: Primary Care

## 2021-01-25 DIAGNOSIS — F32A Depression, unspecified: Secondary | ICD-10-CM

## 2021-01-26 NOTE — Telephone Encounter (Signed)
Patient last f/u for this was 02/29/2020. Ok to call in one refill and let her know she needs to be seen for f/u

## 2021-01-27 NOTE — Telephone Encounter (Signed)
If she is seeing psychiatry now?  If so then the refill request needs to go to her psychiatrist.

## 2021-01-27 NOTE — Telephone Encounter (Signed)
Called v/m not set up 

## 2021-01-29 NOTE — Telephone Encounter (Signed)
Noted, patient due for follow up in March 2022, please schedule.

## 2021-01-29 NOTE — Telephone Encounter (Signed)
See refill request for updated information and documentation.

## 2021-01-29 NOTE — Telephone Encounter (Signed)
Patient called back she is not seeing psychiatrist. Ok to send in refill? She will be out today.

## 2021-02-01 NOTE — Telephone Encounter (Signed)
Called patient to schedule follow up. Unable to leave voicemail due to not being set up. Will attempt again later.

## 2021-02-04 NOTE — Telephone Encounter (Signed)
Called patient to schedule follow up. Unable to lvm. Letter sent.

## 2021-02-23 ENCOUNTER — Telehealth: Payer: Self-pay | Admitting: *Deleted

## 2021-02-23 NOTE — Telephone Encounter (Signed)
Called l/m to call office  

## 2021-02-23 NOTE — Telephone Encounter (Signed)
Please advise ok to send in 90 day per request or hold off until she is seen on 3/24? If you want me to hold off please let me know so that I can call patient and review information

## 2021-02-23 NOTE — Telephone Encounter (Signed)
Did you ever call the patient at 12 pm? Exactly what has she been taking? How much?  Can we add her on for tomorrow rather than for 03/24?

## 2021-02-23 NOTE — Telephone Encounter (Signed)
Pt left VM at triage requesting Joellen to call her back after 12pm due to her being in school. Pt said that CMA "messed up her anxiety/ depression med", pt is requesting CMA to call her after 12pm to get it resolved before she "goes to a different doctor's office"   Pt request CB at [phone]

## 2021-02-23 NOTE — Telephone Encounter (Signed)
Patients mother called back upset about the medication "mess up" according to her insurance plan they will not accept a 30 day supply only 90 day supply. Her medication was not filled back in February due to non coverage. Explained to the mother that we have on numerous occassions attempted to contact her about setting up a followup appt since patient was not seen since 01/2020 for medication. She denies that we have been trying to call as she says in multiple conversations that she has had no one mentioned an appt by the cma. I explained that it was the cma's duty to ask the clinical questions not the scheduling as she is not a scheduler but I did tell her that our scheduler had reached out on multiple occassions along with the cma and she did not have a vm to leave a message. Patients mother also went on to say that we have messed up on multiple occassions on her fiancees meds in the past and she didn't know where the miscommunication is in our office. I assured her that we have been trying to do what is asked of from the provider and that we have been trying to effectively communicate with her daughter. I explained that we are required to do a followup ever so often on certain medications. She told me that she had been given her daughter her  Medication because she had 16 extra and that her daughter was wanting "her own  Medication". I was able to schedule her daughter for an appt with Jae Dire for 02/25/21 at 2:20 pm . EM

## 2021-02-23 NOTE — Telephone Encounter (Signed)
Called and spoke to patients mom. She is taking two of the 75mg  Effexor that mom had. I have moved up appointment to tomorrow. Cleared up all confusion on why we did not send in 90 day supply. Mom was clear on reasons and did not have any further questions.

## 2021-02-23 NOTE — Telephone Encounter (Signed)
Noted and appreciate your time and efforts to clear up the miscommunication.

## 2021-02-24 ENCOUNTER — Encounter: Payer: Self-pay | Admitting: Primary Care

## 2021-02-24 ENCOUNTER — Ambulatory Visit: Payer: 59 | Admitting: Primary Care

## 2021-02-24 ENCOUNTER — Other Ambulatory Visit: Payer: Self-pay

## 2021-02-24 VITALS — BP 102/58 | HR 82 | Temp 98.1°F | Ht 65.0 in | Wt 124.0 lb

## 2021-02-24 DIAGNOSIS — F419 Anxiety disorder, unspecified: Secondary | ICD-10-CM

## 2021-02-24 DIAGNOSIS — G43009 Migraine without aura, not intractable, without status migrainosus: Secondary | ICD-10-CM | POA: Diagnosis not present

## 2021-02-24 DIAGNOSIS — Z3201 Encounter for pregnancy test, result positive: Secondary | ICD-10-CM

## 2021-02-24 DIAGNOSIS — R946 Abnormal results of thyroid function studies: Secondary | ICD-10-CM

## 2021-02-24 DIAGNOSIS — F32A Depression, unspecified: Secondary | ICD-10-CM

## 2021-02-24 MED ORDER — VENLAFAXINE HCL ER 150 MG PO CP24: ORAL_CAPSULE | ORAL | 3 refills | Status: DC

## 2021-02-24 NOTE — Assessment & Plan Note (Signed)
Migraines are anxiety induced mostly, no concerns today. Continue PRN sumatriptan 50 mg.   She doesn't seem to be taking amitriptyline 25 mg, will keep on medication list in case she resumes. Prescribed originally per neurology.

## 2021-02-24 NOTE — Progress Notes (Signed)
Subjective:    Patient ID: Andrea Wilson, female    DOB: 23-Apr-2001, 19 y.o.   MRN: [id number]  HPI  Andrea Wilson is a very pleasant 20 y.o. female who presents today for follow up of medical conditions.  1) GAD: Previously managed on venlafaxine ER 150 mg and was doing well until she ran out about one month ago. She's been taking her mother's venlafaxine 75, taking two capsules, not sure if this was ER version, and has been doing well until last weekend.   She doesn't believe that the hydroxyzine is helpful for anxiety, just makes her "feel drowsy".   Over the last week she's experienced increased anxiety, feels overwhelmed, feels like she can't breathe, "feels like I'm in someone elses body".   She took a pregnancy test and saw a faint positive line. She's managed on OCP's, resumed in March 2022. LMP March 4th 2022. She has been concerned since and is requesting further testing.   2) Migraines: Currently managed on sumatriptan 50 mg PRN. Previously managed on propranolol HS for headache prevention, but has not taken this in months as it caused drowsiness. She was last placed on amitriptlyine 25 mg per neurology, hasn't taken in months, doesn't recall why.  Overall migraines have been infrequent, but she contracted Covid-19 in January 2022 and is having headaches 1-2 times daily. No migraines unless she gets upset. No recent use of sumatriptan as it causes nausea.    Review of Systems  Genitourinary:       Positive urine pregnancy test, faint line, noticed last week.  Neurological: Positive for headaches.  Psychiatric/Behavioral: The patient is nervous/anxious.          Past Medical History:  Diagnosis Date  . Allergy   . Anxiety   . Depression   . Frequent headaches   . Hypotension     Social History   Socioeconomic History  . Marital status: Single    Spouse name: Not on file  . Number of children: Not on file  . Years of education: Not on file  . Highest  education level: Not on file  Occupational History  . Not on file  Tobacco Use  . Smoking status: Never Smoker  . Smokeless tobacco: Never Used  Substance and Sexual Activity  . Alcohol use: No  . Drug use: No  . Sexual activity: Never  Other Topics Concern  . Not on file  Social History Narrative   12th grade student at  [employer]. She does well in school.   Lives with mother and brother. Her sister passed away in 2013-04-10 at age 95 from cancer.   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    Social Determinants of Health   Financial Resource Strain: Not on file  Food Insecurity: Not on file  Transportation Needs: Not on file  Physical Activity: Not on file  Stress: Not on file  Social Connections: Not on file  Intimate Partner Violence: Not on file    Past Surgical History:  Procedure Laterality Date  . KNEE SURGERY    . TOOTH EXTRACTION      Family History  Problem Relation Age of Onset  . Hyperlipidemia Mother   . Anxiety disorder Mother   . Depression Mother   . Hypertension Father   . Cancer Sister   . Cancer Maternal Grandmother   . Lung cancer Paternal Grandfather   . Migraines Other        Mfhx of  migraine  . Seizures Cousin   . Autism Cousin     No Known Allergies  Current Outpatient Medications on File Prior to Visit  Medication Sig Dispense Refill  . amitriptyline (ELAVIL) 25 MG tablet Take 1 tablet (25 mg total) by mouth at bedtime. For migraine prevention. 30 tablet 0  . aspirin-acetaminophen-caffeine (EXCEDRIN MIGRAINE) 250-250-65 MG tablet Take 2 tablets by mouth every 6 (six) hours as needed for headache.    . AUROVELA 24 FE 1-20 MG-MCG(24) tablet Take 1 tablet by mouth daily.    . hydrOXYzine (ATARAX/VISTARIL) 25 MG tablet TAKE 1/2 TO 1 TABLET BY MOUTH 3 TIMES A DAY AS NEEDED FOR ANXIETY 30 tablet 0  . ibuprofen (ADVIL,MOTRIN) 800 MG tablet     . propranolol (INDERAL) 20 MG tablet TAKE 1 TABLET(20 MG) BY MOUTH AT BEDTIME FOR HEADACHE  PREVENTION 90 tablet 0  . SUMAtriptan (IMITREX) 50 MG tablet TAKE 1 TABLET WITH 400-600 MG OF IBUPROFEN FOR MODERATE TO SEVERE HEADACHE, MAXIMUM 2 TIMES A WEEK 10 tablet 0  . venlafaxine XR (EFFEXOR-XR) 150 MG 24 hr capsule TAKE 1 CAPSULE(150 MG) BY MOUTH DAILY WITH BREAKFAST 30 capsule 0   No current facility-administered medications on file prior to visit.    BP (!) 102/58   Pulse 82   Temp 98.1 F (36.7 C) (Temporal)   Ht 5\' 5"  (1.651 m)   Wt 124 lb (56.2 kg)   SpO2 99%   BMI 20.63 kg/m  Objective:   Physical Exam Cardiovascular:     Rate and Rhythm: Normal rate and regular rhythm.  Pulmonary:     Effort: Pulmonary effort is normal.     Breath sounds: Normal breath sounds.  Musculoskeletal:     Cervical back: Neck supple.  Skin:    General: Skin is warm and dry.  Psychiatric:     Comments: Appears anxious today           Assessment & Plan:      This visit occurred during the SARS-CoV-2 public health emergency.  Safety protocols were in place, including screening questions prior to the visit, additional usage of staff PPE, and extensive cleaning of exam room while observing appropriate contact time as indicated for disinfecting solutions.

## 2021-02-24 NOTE — Assessment & Plan Note (Addendum)
Historically seems well managed on venlafaxine XR 150 mg, but last week with several panic attacks.  Unclear if her mothers venlafaxine was ER or regular, this could have contributed to her symptoms. She also resumed her OCP's a few weeks ago, this could also be contributing.   Will refill venlafaxine ER 150 mg, she will update.  Will check HCG quantitative pregnancy.

## 2021-02-24 NOTE — Patient Instructions (Signed)
Stop by the lab prior to leaving today. I will notify you of your results once received.   Resume your venlafaxine ER 150 mg daily for anxiety.  Please verify that you were taking either amitriptyline or propranolol for headache prevention.    It was a pleasure to see you today!

## 2021-02-24 NOTE — Assessment & Plan Note (Signed)
Repeat TSH pending.  Also checking CBC and CMP.

## 2021-02-24 NOTE — Addendum Note (Signed)
Addended by: Alvina Chou on: 02/24/2021 04:19 PM   Modules accepted: Orders

## 2021-02-25 ENCOUNTER — Ambulatory Visit: Payer: 59 | Admitting: Primary Care

## 2021-02-25 LAB — CBC
Hematocrit: 38.1 % (ref 34.0–46.6)
Hemoglobin: 13.3 g/dL (ref 11.1–15.9)
MCH: 30.2 pg (ref 26.6–33.0)
MCHC: 34.9 g/dL (ref 31.5–35.7)
MCV: 87 fL (ref 79–97)
Platelets: 317 10*3/uL (ref 150–450)
RBC: 4.4 x10E6/uL (ref 3.77–5.28)
RDW: 11.5 % — ABNORMAL LOW (ref 11.7–15.4)
WBC: 4.7 10*3/uL (ref 3.4–10.8)

## 2021-02-25 LAB — COMPREHENSIVE METABOLIC PANEL
ALT: 10 IU/L (ref 0–32)
AST: 16 IU/L (ref 0–40)
Albumin/Globulin Ratio: 1.7 (ref 1.2–2.2)
Albumin: 4.7 g/dL (ref 3.9–5.0)
Alkaline Phosphatase: 51 IU/L (ref 42–106)
BUN/Creatinine Ratio: 14 (ref 9–23)
BUN: 10 mg/dL (ref 6–20)
Bilirubin Total: <0.2 mg/dL (ref 0.0–1.2)
CO2: 22 mmol/L (ref 20–29)
Calcium: 9.8 mg/dL (ref 8.7–10.2)
Chloride: 104 mmol/L (ref 96–106)
Creatinine, Ser: 0.69 mg/dL (ref 0.57–1.00)
Globulin, Total: 2.7 g/dL (ref 1.5–4.5)
Glucose: 74 mg/dL (ref 65–99)
Potassium: 4.7 mmol/L (ref 3.5–5.2)
Sodium: 142 mmol/L (ref 134–144)
Total Protein: 7.4 g/dL (ref 6.0–8.5)
eGFR: 127 mL/min/{1.73_m2} (ref >59)

## 2021-02-25 LAB — BETA HCG QUANT (REF LAB): hCG Quant: <1 m[IU]/mL

## 2021-02-25 LAB — TSH: TSH: 0.801 u[IU]/mL (ref 0.450–4.500)

## 2021-02-26 LAB — SPECIMEN STATUS REPORT

## 2021-02-26 LAB — T4, FREE: Free T4: 1.12 ng/dL (ref 0.82–1.77)

## 2021-03-08 ENCOUNTER — Telehealth: Payer: Self-pay

## 2021-03-08 ENCOUNTER — Encounter: Payer: Self-pay | Admitting: Emergency Medicine

## 2021-03-08 ENCOUNTER — Other Ambulatory Visit: Payer: Self-pay

## 2021-03-08 ENCOUNTER — Ambulatory Visit: Payer: Self-pay

## 2021-03-08 ENCOUNTER — Ambulatory Visit
Admission: EM | Admit: 2021-03-08 | Discharge: 2021-03-08 | Disposition: A | Discharge status: Home / Self Care | Payer: 59 | Attending: Family Medicine | Admitting: Family Medicine

## 2021-03-08 DIAGNOSIS — M7918 Myalgia, other site: Secondary | ICD-10-CM

## 2021-03-08 DIAGNOSIS — R519 Headache, unspecified: Secondary | ICD-10-CM | POA: Diagnosis not present

## 2021-03-08 LAB — POCT URINALYSIS DIP (MANUAL ENTRY)
Glucose, UA: NEGATIVE mg/dL
Leukocytes, UA: NEGATIVE
Nitrite, UA: NEGATIVE
Spec Grav, UA: >=1.03 — AB (ref 1.010–1.025)
Urobilinogen, UA: 1 E.U./dL
pH, UA: 6 (ref 5.0–8.0)

## 2021-03-08 MED ORDER — TIZANIDINE HCL 2 MG PO TABS: 2.0000 mg | ORAL_TABLET | Freq: Two times a day (BID) | ORAL | 0 refills | Status: DC | PRN

## 2021-03-08 MED ORDER — ONDANSETRON HCL 4 MG PO TABS: 4.0000 mg | ORAL_TABLET | Freq: Four times a day (QID) | ORAL | 0 refills | Status: DC

## 2021-03-08 NOTE — ED Triage Notes (Signed)
Nauseated, headache, lower back pain, urinary frequency.

## 2021-03-08 NOTE — Telephone Encounter (Signed)
[facility] Night - Client TELEPHONE ADVICE RECORD [facility] Patient Name: Andrea Wilson Gender: Female DOB: 12-30-2000 Age: 20 Y 5 M 13 D Return Phone Number: [phone] (Primary) Address: City/ State/ Zip: [place] [address] Client [facility] Night - Client Client Site [facility] - Night Physician Vernona Rieger - NP Contact Type Call Who Is Calling Patient / Member / Family / Caregiver Call Type Triage / Clinical Relationship To Patient Self Return Phone Number [phone] (Primary) Chief Complaint Nausea Reason for Call Request to Schedule Office Appointment Initial Comment Caller is wanting to schedule an appointment. Caller states she is very nauseated Translation No Nurse Assessment Nurse: Doylene Canard, RN, Lesa Date/Time (Eastern Time): 03/08/2021 9:22:34 AM Confirm and document reason for call. If symptomatic, describe symptoms. ---Caller states she has nausea. She has scheduled an appt Does the patient have any new or worsening symptoms? ---Yes Will a triage be completed? ---Yes Related visit to physician within the last 2 weeks? ---No Does the PT have any chronic conditions? (i.e. diabetes, asthma, this includes High risk factors for pregnancy, etc.) ---No Is the patient pregnant or possibly pregnant? (Ask all females between the ages of 95-55) ---No Is this a behavioral health or substance abuse call? ---No Guidelines Guideline Title Affirmed Question Affirmed Notes Nurse Date/Time (Eastern Time) Nausea Taking prescription medication that could cause nausea (e.g., narcotics/opiates, antibiotics, OCPs, many others) Conner, RN, Lesa 03/08/2021 9:24:12 AM Disp. Time Lamount Cohen Time) Disposition Final User 03/08/2021 9:10:25 AM Attempt made - message left Conner, RN, Lesa PLEASE NOTE: All timestamps contained within this report are represented as [place] Standard  Time. CONFIDENTIALTY NOTICE: This fax transmission is intended only for the addressee. It contains information that is legally privileged, confidential or otherwise protected from use or disclosure. If you are not the intended recipient, you are strictly prohibited from reviewing, disclosing, copying using or disseminating any of this information or taking any action in reliance on or regarding this information. If you have received this fax in error, please notify us immediately by telephone so that we can arrange for its return to [place]. Phone: [phone], Toll-Free: [phone], Fax: [phone] Page: 2 of 4 Call Id: [id number] 03/08/2021 9:29:28 AM Call PCP within 24 Hours Yes Conner, RN, Gean Maidens Caller Disagree/Comply Comply Caller Understands Yes PreDisposition Did not know what to do Care Advice Given Per Guideline CALL PCP WITHIN 24 HOURS: * You need to discuss this with your doctor (or NP/PA) within the next 24 hours. CLEAR FLUIDS: * Take clear fluids in small amounts until the nausea is resolved for 8 hours: * Sip water or rehydration liquid (Gatorade or Powerade) * Other options: 1/2 strength flat lemon-lime soda or ginger ale. SOLIDS: * Gradually return to a normal diet. * Start with saltine crackers, white bread, rice, mashed potatoes, cereal, apple sauce etc. AVOID MEDS: * Stop taking all non-prescription medicines. (Reason: may make nausea worse.) * Avoid NSAIDs, which can cause gastritis CALL BACK IF: * You become worse CARE ADVICE given per Nausea (Adult) guideline. Triage Details User: Doylene Canard, RN, Lesa Date/Time: 03/08/2021 9:24:12 AM Triage Guideline: Nausea Shock suspected (e.g., cold/pale/clammy skin, too weak to stand, low BP, rapid pulse) -----No Sounds like a life-threatening emergency to the triager -----No [1] Nausea or vomiting AND [2] pregnancy < 20 weeks -----No Menstrual Period - Missed or Late (i.e., pregnancy suspected) -----No Heat exhaustion suspected (i.e.,  dehydration from heat exposure) -----No Motion sickness suspected (i.e., nausea with  car, plane, boat, or train travel) -----No Anxiety or stress suspected (i.e., nausea with anxiety attacks or stressful situations) -----No Traumatic Brain Injury (TBI) suspected -----No PLEASE NOTE: All timestamps contained within this report are represented as [place] Standard Time. CONFIDENTIALTY NOTICE: This fax transmission is intended only for the addressee. It contains information that is legally privileged, confidential or otherwise protected from use or disclosure. If you are not the intended recipient, you are strictly prohibited from reviewing, disclosing, copying using or disseminating any of this information or taking any action in reliance on or regarding this information. If you have received this fax in error, please notify us immediately by telephone so that we can arrange for its return to [place]. Phone: [phone], Toll-Free: [phone], Fax: [phone] Page: 3 of 4 Call Id: [id number] Triage Details Nausea (or Vomiting) in a cancer patient who is currently (or recently) receiving chemotherapy or radiation therapy, or cancer patient who has metastatic or end-stage cancer and is receiving palliative care -----No Vomiting occurs -----No Other symptom is present, see that guideline. (e.g., chest pain, headache, dizziness, abdominal pain, colds, sore throat, etc.). -----No Unable to walk, or can only walk with assistance (e.g., requires support) -----No Difficulty breathing -----No [1] Insulin-dependent diabetes (Type I) AND [2] glucose > 400 mg/dl (22 mmol/l) -----No [2] Drinking very little AND [2] dehydration suspected (e.g., no urine > 12 hours, very dry mouth, very lightheaded) -----No Patient sounds very sick or weak to the triager -----No Fever > 104 F (40 C) -----No [1] Fever > 101 F (38.3 C) AND [2] age > 60 years -----No [1] Fever > 100.0 F (37.8 C) AND [2] bedridden  (e.g., nursing home patient, CVA, chronic illness, recovering from surgery) -----No [1] Fever > 100.0 F (37.8 C) AND [2] diabetes mellitus or weak immune system (e.g., HIV positive, cancer chemo, splenectomy, organ transplant, chronic steroids) -----No Taking any of the following medications: digoxin (Lanoxin), lithium, theophylline, phenytoin (Dilantin) -----No Yellowish color of the skin or white of the eye (i.e., jaundice) -----No Fever present > 3 days (72 hours) -----No Receiving cancer chemotherapy medication PLEASE NOTE: All timestamps contained within this report are represented as [place] Standard Time. CONFIDENTIALTY NOTICE: This fax transmission is intended only for the addressee. It contains information that is legally privileged, confidential or otherwise protected from use or disclosure. If you are not the intended recipient, you are strictly prohibited from reviewing, disclosing, copying using or disseminating any of this information or taking any action in reliance on or regarding this information. If you have received this fax in error, please notify us immediately by telephone so that we can arrange for its return to [place]. Phone: [phone], Toll-Free: [phone], Fax: [phone] Page: 4 of 4 Call Id: [id number] Triage Details -----No Taking prescription medication that could cause nausea (e.g., narcotics/opiates, antibiotics, OCPs, many others) -----Yes Disposition: Call PCP within 24 Hours CALL PCP WITHIN 24 HOURS: * You need to discuss this with your doctor (or NP/PA) within the next 24 hours. CLEAR FLUIDS: * Take clear fluids in small amounts until the nausea is resolved for 8 hours: * Sip water or rehydration liquid (Gatorade or Powerade) * Other options: 1/2 strength flat lemon-lime soda or ginger ale. SOLIDS: * Gradually return to a normal diet. * Start with saltine crackers, white bread, rice, mashed potatoes, cereal, apple sauce etc. AVOID MEDS: * Stop  taking all non-prescription medicines. (Reason: may make nausea worse.) * Avoid NSAIDs, which can cause gastritis CALL BACK IF: * You become worse  CARE ADVICE given per Nausea (Adult) guideline.

## 2021-03-08 NOTE — Telephone Encounter (Signed)
Noted, agree with advice given 

## 2021-03-08 NOTE — Telephone Encounter (Signed)
I spoke with pt; starting on 03/05/21 pt had nausea and dry heaves. Last nausea and dry heaves was last night; on 03/05/21 pt was eating and felt nauseated after biting into a stink bug by mistake. Pt has had a H/A since covid in Jan. Pt vomited x 1 after getting very hot feeling one wk ago. No other covid symptoms. Pt said took pregnancy test which was neg; then took 2nd urinary pregnancy test last wk and was lightly positive.  Then pt had hcg quant and it was neg on 02/24/21. LMP pt started on 03/07/21. Starting on 03/03/21 frequency of urine, no burning or pain upon urination, no urgency, and no fever. Pt did urine test for UTI at urologist office which was neg but pt did not see urologist and only had urine cked. No frequency of urine now. Pt also having lt lower back pain with pain level of 6. No abd pain but pt feels bloated; pt thinks bloating is due to starting menstrual period. pts mom is going to take pt to [facility] in [place]. Sending note to Allayne Gitelman NP as PCP and Pamala Hurry NP who is in office.

## 2021-03-08 NOTE — ED Provider Notes (Signed)
[facility]    CSN: [id number] Arrival date & time: 03/08/21  1343      History   Chief Complaint No chief complaint on file.   HPI Andrea Wilson is a 20 y.o. female.   HPI Andrea Wilson is a 20 y.o. female presents for evaluation of urinary frequency, urgency and dysuria x 1 days, without flank pain, fever, chills, or abnormal vaginal discharge or bleeding.  endorsees headache-patient is a chronic migraine sufferer. Bilateral lower back pain. No history of recurrent UTI . Patient's last menstrual period was 03/07/2021.  Past Medical History:  Diagnosis Date  . Allergy   . Anxiety   . Depression   . Frequent headaches   . Hypotension     Patient Active Problem List   Diagnosis Date Noted  . Axillary pain, left 06/16/2020  . Vaginal itching 09/24/2019  . Chronic cystitis 09/13/2019  . Nausea 05/22/2019  . Abnormal thyroid function test 05/22/2019  . Rash and nonspecific skin eruption 04/04/2019  . Hemorrhoid 02/11/2019  . Chest pain 08/23/2018  . Screening examination for STD (sexually transmitted disease) 05/08/2017  . Migraine without aura and without status migrainosus, not intractable 11/08/2016  . Chronic headaches 10/13/2016  . Anxiety and depression 04/30/2015    Past Surgical History:  Procedure Laterality Date  . KNEE SURGERY    . TOOTH EXTRACTION      OB History   No obstetric history on file.      Home Medications    Prior to Admission medications   Medication Sig Start Date End Date Taking? Authorizing Provider  amitriptyline (ELAVIL) 25 MG tablet Take 25 mg by mouth at bedtime.   Yes [provider]  ondansetron (ZOFRAN) 4 MG tablet Take 1 tablet (4 mg total) by mouth every 6 (six) hours. 03/08/21  Yes Bing Neighbors, FNP  tiZANidine (ZANAFLEX) 2 MG tablet Take 1-2 tablets (2-4 mg total) by mouth 2 (two) times daily as needed for muscle spasms. 03/08/21  Yes Bing Neighbors, FNP  aspirin-acetaminophen-caffeine  (EXCEDRIN MIGRAINE) [phone] MG tablet Take 2 tablets by mouth every 6 (six) hours as needed for headache.    [provider]  AUROVELA 24 FE 1-20 MG-MCG(24) tablet Take 1 tablet by mouth daily. 11/15/19   [provider]  hydrOXYzine (ATARAX/VISTARIL) 25 MG tablet TAKE 1/2 TO 1 TABLET BY MOUTH 3 TIMES A DAY AS NEEDED FOR ANXIETY 08/09/18   Doreene Nest, NP  ibuprofen (ADVIL,MOTRIN) 800 MG tablet  06/23/17   [provider]  propranolol (INDERAL) 20 MG tablet Take 20 mg by mouth daily. For headache prevention.    [provider]  SUMAtriptan (IMITREX) 50 MG tablet TAKE 1 TABLET WITH 400-600 MG OF IBUPROFEN FOR MODERATE TO SEVERE HEADACHE, MAXIMUM 2 TIMES A WEEK 07/13/20   Baity, Salvadore Oxford, NP  venlafaxine XR (EFFEXOR-XR) 150 MG 24 hr capsule TAKE 1 CAPSULE(150 MG) BY MOUTH DAILY WITH BREAKFAST for anxiety. 02/24/21   Doreene Nest, NP    Family History Family History  Problem Relation Age of Onset  . Hyperlipidemia Mother   . Anxiety disorder Mother   . Depression Mother   . Hypertension Father   . Cancer Sister   . Cancer Maternal Grandmother   . Lung cancer Paternal Grandfather   . Migraines Other        Mfhx of migraine  . Seizures Cousin   . Autism Cousin     Social History Social History  Tobacco Use  . Smoking status: Never Smoker  . Smokeless tobacco: Never Used  Substance Use Topics  . Alcohol use: No  . Drug use: No     Allergies   Patient has no known allergies. Review of Systems Review of Systems Pertinent negatives listed in HPI   Physical Exam Triage Vital Signs ED Triage Vitals  Enc Vitals Group     BP 03/08/21 1353 113/65     Pulse Rate 03/08/21 1353 96     Resp 03/08/21 1353 18     Temp 03/08/21 1353 (!) 97.2 F (36.2 C)     Temp Source 03/08/21 1353 Oral     SpO2 03/08/21 1353 96 %     Weight --      Height --      Head Circumference --      Peak Flow --      Pain Score 03/08/21 1354 6     Pain Loc  --      Pain Edu? --      Excl. in GC? --    No data found.  Updated Vital Signs BP 113/65 (BP Location: Right Arm)   Pulse 96   Temp (!) 97.2 F (36.2 C) (Oral)   Resp 18   LMP 03/07/2021   SpO2 96%   Visual Acuity Right Eye Distance:   Left Eye Distance:   Bilateral Distance:    Right Eye Near:   Left Eye Near:    Bilateral Near:     Physical Exam General appearance: alert, well developed, well nourished, cooperative and in no distress Head: Normocephalic, without obvious abnormality, atraumatic Respiratory: Respirations even and unlabored, normal respiratory rate Heart: rate and rhythm normal. No gallop or murmurs noted on exam  Abdomen: BS +, no distention, no rebound tenderness, or no mass Lumbar spine: bilateral lumbosacral tenderness with palpation. Negative SLR, no mass or bulge present  Skin: Skin color, texture, turgor normal. No rashes seen  Psych: Appropriate mood and affect. Neurologic: Mental status: Alert, oriented to person, place, and time, thought content appropriate.  UC Treatments / Results  Labs (all labs ordered are listed, but only abnormal results are displayed) Labs Reviewed  POCT URINALYSIS DIP (MANUAL ENTRY) - Abnormal; Notable for the following components:      Result Value   Color, UA straw (*)    Clarity, UA hazy (*)    Bilirubin, UA small (*)    Ketones, POC UA trace (5) (*)    Spec Grav, UA >=1.030 (*)    Blood, UA large (*)    Protein Ur, POC trace (*)    All other components within normal limits  URINE CULTURE    EKG   Radiology No results found.  Procedures Procedures (including critical care time)  Medications Ordered in UC Medications - No data to display  Initial Impression / Assessment and Plan / UC Course  I have reviewed the triage vital signs and the nursing notes.  Pertinent labs & imaging results that were available during my care of the patient were reviewed by me and considered in my medical decision  making (see chart for details).      UA and significant for findings consistent with a urinary tract infection.  Therefore will culture urine to rule out infection as a source of symptoms.  Will treat headache with tizanidine and nausea with Zofran.  Tizanidine will also help with any muscle related strain causing back pain.  Return precautions given.  Final  Clinical Impressions(s) / UC Diagnoses   Final diagnoses:  Lumbar muscle pain  Nonintractable headache, unspecified chronicity pattern, unspecified headache type   Discharge Instructions   None    ED Prescriptions    Medication Sig Dispense Auth. Provider   tiZANidine (ZANAFLEX) 2 MG tablet Take 1-2 tablets (2-4 mg total) by mouth 2 (two) times daily as needed for muscle spasms. 20 tablet Bing Neighbors, FNP   ondansetron (ZOFRAN) 4 MG tablet Take 1 tablet (4 mg total) by mouth every 6 (six) hours. 20 tablet Bing Neighbors, FNP     PDMP not reviewed this encounter.   Bing Neighbors, FNP 03/12/21 [phone]

## 2021-03-09 ENCOUNTER — Ambulatory Visit: Payer: 59 | Admitting: Internal Medicine

## 2021-03-10 LAB — URINE CULTURE
Culture: NO GROWTH
Special Requests: NORMAL

## 2021-03-22 DIAGNOSIS — F419 Anxiety disorder, unspecified: Secondary | ICD-10-CM

## 2021-03-22 DIAGNOSIS — F32A Depression, unspecified: Secondary | ICD-10-CM

## 2021-03-24 MED ORDER — HYDROXYZINE HCL 25 MG PO TABS: 25.0000 mg | ORAL_TABLET | Freq: Two times a day (BID) | ORAL | 0 refills | Status: DC | PRN

## 2021-03-29 ENCOUNTER — Encounter: Payer: Self-pay | Admitting: Primary Care

## 2021-04-01 ENCOUNTER — Other Ambulatory Visit: Payer: Self-pay | Admitting: Primary Care

## 2021-04-01 DIAGNOSIS — G43009 Migraine without aura, not intractable, without status migrainosus: Secondary | ICD-10-CM

## 2021-07-07 ENCOUNTER — Telehealth (INDEPENDENT_AMBULATORY_CARE_PROVIDER_SITE_OTHER): Payer: 59 | Admitting: Primary Care

## 2021-07-07 ENCOUNTER — Other Ambulatory Visit: Payer: 59

## 2021-07-07 ENCOUNTER — Encounter: Payer: Self-pay | Admitting: Primary Care

## 2021-07-07 VITALS — Ht 65.0 in | Wt 124.0 lb

## 2021-07-07 DIAGNOSIS — J3489 Other specified disorders of nose and nasal sinuses: Secondary | ICD-10-CM | POA: Insufficient documentation

## 2021-07-07 DIAGNOSIS — Z20822 Contact with and (suspected) exposure to covid-19: Secondary | ICD-10-CM

## 2021-07-07 NOTE — Progress Notes (Signed)
Patient ID: [name], female    DOB: 2001-07-19, 20 y.o.   MRN: [id number]  Virtual visit completed through [employer], a video enabled telemedicine application. Due to national recommendations of social distancing due to COVID-19, a virtual visit is felt to be most appropriate for this patient at this time. Reviewed limitations, risks, security and privacy concerns of performing a virtual visit and the availability of in person appointments. I also reviewed that there may be a patient responsible charge related to this service. The patient agreed to proceed.   Patient location: home Provider location: [place] at [facility], office Persons participating in this virtual visit: patient, provider   If any vitals were documented, they were collected by patient at home unless specified below.    Ht 5\' 5"  (1.651 m)   Wt 124 lb (56.2 kg)   BMI 20.63 kg/m    CC: Sinus Pressure Subjective:   HPI: Andrea Wilson is a 20 y.o. female presenting on 07/07/2021 for symptoms of sinus pressure.  She also reports dry cough, headaches, sneezing, and fatigue. Symptoms began two days ago. She took two home Covid-19 tests (two days ago and yesterday), both of which were negative.  Her father and brother have tested positive for Covid-19. She was around both of them prior to symptom onset. Her father had been exposed to Covid-19 several days previously.   She's been taking Zinc, Sudafed, Benadryl, and AirBorne. She's had two Covid-19 vaccines.  Her current symptoms feel similar to her prior COVID infection earlier this year.       Relevant past medical, surgical, family and social history reviewed and updated as indicated. Interim medical history since our last visit reviewed. Allergies and medications reviewed and updated. Outpatient Medications Prior to Visit  Medication Sig Dispense Refill   aspirin-acetaminophen-caffeine (EXCEDRIN MIGRAINE) 250-250-65 MG tablet Take 2 tablets by mouth  every 6 (six) hours as needed for headache.     hydrOXYzine (ATARAX/VISTARIL) 25 MG tablet Take 1 tablet (25 mg total) by mouth 2 (two) times daily as needed for anxiety. 60 tablet 0   ibuprofen (ADVIL,MOTRIN) 800 MG tablet      LO LOESTRIN FE 1 MG-10 MCG / 10 MCG tablet Take 1 tablet by mouth daily.     propranolol (INDERAL) 20 MG tablet TAKE 1 TABLET(20 MG) BY MOUTH AT BEDTIME FOR HEADACHE PREVENTION 90 tablet 1   SUMAtriptan (IMITREX) 50 MG tablet TAKE 1 TABLET WITH 400-600 MG OF IBUPROFEN FOR MODERATE TO SEVERE HEADACHE, MAXIMUM 2 TIMES A WEEK 10 tablet 0   venlafaxine XR (EFFEXOR-XR) 150 MG 24 hr capsule TAKE 1 CAPSULE(150 MG) BY MOUTH DAILY WITH BREAKFAST for anxiety. 90 capsule 3   ondansetron (ZOFRAN) 4 MG tablet Take 1 tablet (4 mg total) by mouth every 6 (six) hours. (Patient not taking: Reported on 07/07/2021) 20 tablet 0   AUROVELA 24 FE 1-20 MG-MCG(24) tablet Take 1 tablet by mouth daily.     Norethin-Eth Estrad-Fe Biphas (LO LOESTRIN FE PO) Take by mouth.     tiZANidine (ZANAFLEX) 2 MG tablet Take 1-2 tablets (2-4 mg total) by mouth 2 (two) times daily as needed for muscle spasms. 20 tablet 0   No facility-administered medications prior to visit.     Per HPI unless specifically indicated in ROS section below Review of Systems  Constitutional:  Positive for fatigue. Negative for fever.  HENT:  Positive for congestion and sinus pressure.   Respiratory:  Positive for cough.   Neurological:  Positive for headaches.  Objective:  Ht 5\' 5"  (1.651 m)   Wt 124 lb (56.2 kg)   BMI 20.63 kg/m   Wt Readings from Last 3 Encounters:  07/07/21 124 lb (56.2 kg)  02/24/21 124 lb (56.2 kg)  06/16/20 131 lb (59.4 kg) (56 %, Z= 0.14)*   * Growth percentiles are based on CDC (Girls, 2-20 Years) data.       Physical exam: Gen: alert, NAD, not ill appearing Pulm: speaks in complete sentences without increased work of breathing, no cough during exam Psych: normal mood, normal thought content       Results for orders placed or performed during the hospital encounter of 03/08/21  Urine culture   Specimen: Urine, Clean Catch  Result Value Ref Range   Specimen Description      URINE, CLEAN CATCH Performed at [facility], [address] [place], [place]    Special Requests      Normal Performed at [facility], [address], [place], [place]    Culture      NO GROWTH Performed at [facility], [address] [place], [place]    Report Status 03/10/2021 FINAL   POCT urinalysis dipstick  Result Value Ref Range   Color, UA straw (A) yellow   Clarity, UA hazy (A) clear   Glucose, UA negative negative mg/dL   Bilirubin, UA small (A) negative   Ketones, POC UA trace (5) (A) negative mg/dL   Spec Grav, UA 05/10/2021 (A) 1.010 - 1.025   Blood, UA large (A) negative   pH, UA 6.0 5.0 - 8.0   Protein Ur, POC trace (A) negative mg/dL   Urobilinogen, UA 1.0 0.2 or 1.0 E.U./dL   Nitrite, UA Negative Negative   Leukocytes, UA Negative Negative   Assessment & Plan:   Problem List Items Addressed This Visit       Other   Sinus pressure - Primary    Exposure to COVID-19, symptoms today are suspicious for positive result.  Suspect she tested too soon which resulted in negative results, especially since she has had 2 COVID-19 vaccines  We will retest via PCR today. Patient will quarantine until results are known. Continue conservative treatment.  She appears stable for outpatient treatment       Relevant Orders   Novel Coronavirus, NAA ([facility])   Other Visit Diagnoses     Exposure to COVID-19 virus       Relevant Orders   Novel Coronavirus, NAA ([facility])        No orders of the defined types were placed in this encounter.  Orders Placed This Encounter  Procedures   Novel Coronavirus, NAA ([facility])    Standing Status:   Future    Standing Expiration Date:   07/07/2022    Order Specific Question:   Is this test for diagnosis  or screening    Answer:   Diagnosis of ill patient    Order Specific Question:   Symptomatic for COVID-19 as defined by CDC    Answer:   Yes    Order Specific Question:   Date of Symptom Onset    Answer:   07/05/2021    Order Specific Question:   Hospitalized for COVID-19    Answer:   No    Order Specific Question:   Admitted to ICU for COVID-19    Answer:   No    Order Specific Question:   Resident in a congregate (group)  care setting    Answer:   No    Order Specific Question:   Is the patient student?    Answer:   No    Order Specific Question:   Employed in healthcare setting    Answer:   No    Order Specific Question:   Pregnant    Answer:   No    Order Specific Question:   Has patient completed COVID vaccination(s) (2 doses of Pfizer/Moderna 1 dose of [employer])    Answer:   Yes    Order Specific Question:   Has patient completed COVID Booster / 3rd dose    Answer:   No    Order Specific Question:   Previously tested for COVID-19    Answer:   Yes    I discussed the assessment and treatment plan with the patient. The patient was provided an opportunity to ask questions and all were answered. The patient agreed with the plan and demonstrated an understanding of the instructions. The patient was advised to call back or seek an in-person evaluation if the symptoms worsen or if the condition fails to improve as anticipated.  Follow up plan:  Please come by this afternoon for your COVID test.  Continue taking the medications we discussed.  I will be in touch later this week regarding your results.  Continue to quarantine until we know your COVID test results.  It was a pleasure to see you today!   Doreene Nest, NP

## 2021-07-07 NOTE — Patient Instructions (Signed)
Please come by this afternoon for your COVID test.  Continue taking the medications we discussed.  I will be in touch later this week regarding your results.  Continue to quarantine until we know your COVID test results.  It was a pleasure to see you today!

## 2021-07-07 NOTE — Assessment & Plan Note (Signed)
Exposure to COVID-19, symptoms today are suspicious for positive result.  Suspect she tested too soon which resulted in negative results, especially since she has had 2 COVID-19 vaccines  We will retest via PCR today. Patient will quarantine until results are known. Continue conservative treatment.  She appears stable for outpatient treatment

## 2021-07-08 DIAGNOSIS — U071 COVID-19: Secondary | ICD-10-CM

## 2021-07-08 LAB — NOVEL CORONAVIRUS, NAA: SARS-CoV-2, NAA: DETECTED — AB

## 2021-07-08 LAB — SARS-COV-2, NAA 2 DAY TAT

## 2021-07-09 MED ORDER — HYDROCOD POLST-CPM POLST ER 10-8 MG/5ML PO SUER: 5.0000 mL | Freq: Two times a day (BID) | ORAL | 0 refills | Status: DC | PRN

## 2021-07-20 NOTE — Telephone Encounter (Signed)
Called patient made sure no suicidal thoughts. She states she has just had increased fatigue and irritation over the last few months. Have made follow up in office once you are back. Patient informed if any changes in symptoms or thought of harming self in any way to give our office a call and we get her in to see another provider in our office.

## 2021-07-24 NOTE — Telephone Encounter (Signed)
Noted, will evaluate. 

## 2021-08-03 ENCOUNTER — Other Ambulatory Visit: Payer: Self-pay

## 2021-08-03 ENCOUNTER — Other Ambulatory Visit: Payer: Self-pay | Admitting: Primary Care

## 2021-08-03 ENCOUNTER — Ambulatory Visit: Payer: 59 | Admitting: Primary Care

## 2021-08-03 ENCOUNTER — Encounter: Payer: Self-pay | Admitting: Primary Care

## 2021-08-03 VITALS — BP 104/68 | HR 81 | Temp 98.1°F | Ht 65.0 in | Wt 127.0 lb

## 2021-08-03 DIAGNOSIS — R519 Headache, unspecified: Secondary | ICD-10-CM

## 2021-08-03 DIAGNOSIS — R5383 Other fatigue: Secondary | ICD-10-CM | POA: Diagnosis not present

## 2021-08-03 DIAGNOSIS — F419 Anxiety disorder, unspecified: Secondary | ICD-10-CM

## 2021-08-03 DIAGNOSIS — F32A Depression, unspecified: Secondary | ICD-10-CM

## 2021-08-03 DIAGNOSIS — R300 Dysuria: Secondary | ICD-10-CM | POA: Diagnosis not present

## 2021-08-03 DIAGNOSIS — G8929 Other chronic pain: Secondary | ICD-10-CM

## 2021-08-03 DIAGNOSIS — G43009 Migraine without aura, not intractable, without status migrainosus: Secondary | ICD-10-CM | POA: Diagnosis not present

## 2021-08-03 HISTORY — DX: Other fatigue: R53.83

## 2021-08-03 LAB — POC URINALSYSI DIPSTICK (AUTOMATED)
Bilirubin, UA: NEGATIVE
Glucose, UA: NEGATIVE
Ketones, UA: NEGATIVE
Nitrite, UA: POSITIVE
Protein, UA: NEGATIVE
Spec Grav, UA: 1.015 (ref 1.010–1.025)
Urobilinogen, UA: 0.2 E.U./dL
pH, UA: 6 (ref 5.0–8.0)

## 2021-08-03 LAB — POCT URINE PREGNANCY: Preg Test, Ur: NEGATIVE

## 2021-08-03 MED ORDER — TOPIRAMATE 25 MG PO TABS: 25.0000 mg | ORAL_TABLET | Freq: Every day | ORAL | 0 refills | Status: DC

## 2021-08-03 NOTE — Progress Notes (Signed)
Subjective:    Patient ID: Andrea Wilson, female    DOB: 03/02/2001, 19 y.o.   MRN: [id number]  HPI  Andrea Wilson is a very pleasant 20 y.o. female with a history of migraines, chronic cystitis, anxiety, depression, Covid-19 infection who presents today to discuss fatigue and dysuria.  She would like to discuss chronic fatigue. She feels tired during the day, goes to bed around 9 pm-10 pm sleeps 9-10 hours nightly, wakes up feeling tired, naps most everyday around 2 pm for 4-6 hours, then eats dinner, then goes back to bed for the night. She never wants to get out of bed.   She does feel sleepy at school if not stimulated. Sometimes feels tired while driving in the car but has never fallen asleep at the wheel. She doesn't notice fatigue or feeling tired when at dinner with family or friends.   She feels well managed on venlafaxine ER 150 mg for anxiety and depression. Appetite has decreased slightly, eats breakfast and dinner, sometimes lunch or snacks. She drinks no water, drinks soda or sweet tea mostly.   She is managed on propranolol 20 mg daily for migraine prevention. This has worked some for improving headaches, but she does still get migraines and headaches. She is no longer on amitriptyline.   Symptoms of dysuria, frequency, and nausea began last night. She's taken AZO pills. She denies hematuria, vomiting, breast tenderness. Her last menstrual period was a few months ago, she ran out of birth control one week ago. She is sexually active.  Review of Systems  Constitutional:  Positive for fatigue.  Genitourinary:  Positive for dysuria and frequency. Negative for hematuria and vaginal discharge.        Past Medical History:  Diagnosis Date   Allergy    Anxiety    Depression    Frequent headaches    Hypotension     Social History   Socioeconomic History   Marital status: Single    Spouse name: Not on file   Number of children: Not on file   Years of education:  Not on file   Highest education level: Not on file  Occupational History   Not on file  Tobacco Use   Smoking status: Never   Smokeless tobacco: Never  Substance and Sexual Activity   Alcohol use: No   Drug use: No   Sexual activity: Never  Other Topics Concern   Not on file  Social History Narrative   12th grade student at  [employer]. She does well in school.   Lives with mother and brother. Her sister passed away in 03-21-13 at age 81 from cancer.   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    Social Determinants of Health   Financial Resource Strain: Not on file  Food Insecurity: Not on file  Transportation Needs: Not on file  Physical Activity: Not on file  Stress: Not on file  Social Connections: Not on file  Intimate Partner Violence: Not on file    Past Surgical History:  Procedure Laterality Date   KNEE SURGERY     TOOTH EXTRACTION      Family History  Problem Relation Age of Onset   Hyperlipidemia Mother    Anxiety disorder Mother    Depression Mother    Hypertension Father    Cancer Sister    Uterine cancer Maternal Grandmother    Lung cancer Paternal Grandfather    Migraines Other  Mfhx of migraine   Seizures Cousin    Autism Cousin     No Known Allergies  Current Outpatient Medications on File Prior to Visit  Medication Sig Dispense Refill   aspirin-acetaminophen-caffeine (EXCEDRIN MIGRAINE) 250-250-65 MG tablet Take 2 tablets by mouth every 6 (six) hours as needed for headache.     hydrOXYzine (ATARAX/VISTARIL) 25 MG tablet Take 1 tablet (25 mg total) by mouth 2 (two) times daily as needed for anxiety. 60 tablet 0   ibuprofen (ADVIL,MOTRIN) 800 MG tablet      LO LOESTRIN FE 1 MG-10 MCG / 10 MCG tablet Take 1 tablet by mouth daily.     ondansetron (ZOFRAN) 4 MG tablet Take 1 tablet (4 mg total) by mouth every 6 (six) hours. 20 tablet 0   SUMAtriptan (IMITREX) 50 MG tablet TAKE 1 TABLET WITH 400-600 MG OF IBUPROFEN FOR MODERATE TO  SEVERE HEADACHE, MAXIMUM 2 TIMES A WEEK 10 tablet 0   venlafaxine XR (EFFEXOR-XR) 150 MG 24 hr capsule TAKE 1 CAPSULE(150 MG) BY MOUTH DAILY WITH BREAKFAST for anxiety. 90 capsule 3   No current facility-administered medications on file prior to visit.    BP 104/68   Pulse 81   Temp 98.1 F (36.7 C) (Temporal)   Ht 5\' 5"  (1.651 m)   Wt 127 lb (57.6 kg)   SpO2 96%   BMI 21.13 kg/m  Objective:   Physical Exam Cardiovascular:     Rate and Rhythm: Normal rate and regular rhythm.  Pulmonary:     Effort: Pulmonary effort is normal.     Breath sounds: Normal breath sounds.  Musculoskeletal:     Cervical back: Neck supple.  Skin:    General: Skin is warm and dry.  Psychiatric:        Mood and Affect: Mood normal.          Assessment & Plan:      This visit occurred during the SARS-CoV-2 public health emergency.  Safety protocols were in place, including screening questions prior to the visit, additional usage of staff PPE, and extensive cleaning of exam room while observing appropriate contact time as indicated for disinfecting solutions.

## 2021-08-03 NOTE — Patient Instructions (Signed)
Stop by the lab prior to leaving today. I will notify you of your results once received.   Stop propranolol 20 mg for migraines.  Start Topamax 25 mg at bedtime for migraines.   Please update me via my chart in a few weeks regarding your fatigue.   It was a pleasure to see you today!

## 2021-08-03 NOTE — Addendum Note (Signed)
Addended by: Aquilla Solian on: 08/03/2021 03:27 PM   Modules accepted: Orders

## 2021-08-03 NOTE — Assessment & Plan Note (Signed)
Chronic for months, maybe even years.  Urine pregnancy negative today. Checking labs including B12, vitamin D, CBC, TSH.  Suspect propranolol could be contributing. Will stop and change migraine regimen.   She doesn't appear depressed. Continue venlafaxine ER 150 mg.   Consider neurology referral for sleep disorder evaluation.   Await results and updates from propranolol discontinuation.

## 2021-08-03 NOTE — Assessment & Plan Note (Signed)
Appears to be well controlled on venlafaxine ER 150 mg. Continue same.

## 2021-08-03 NOTE — Assessment & Plan Note (Signed)
Some improvement on propranolol 20 mg daily but suspect this could be contributing to her daily fatigue.  Stop propranolol.  Start Topamax 25 mg HS.  She will update.

## 2021-08-03 NOTE — Assessment & Plan Note (Signed)
Some improvement on propranolol 20 mg daily but suspect this could be contributing to her daily fatigue.  Stop propranolol.  Start Topamax 25 mg HS.  She will update.  

## 2021-08-04 LAB — CBC
Hematocrit: 41.6 % (ref 34.0–46.6)
Hemoglobin: 14 g/dL (ref 11.1–15.9)
MCH: 29.7 pg (ref 26.6–33.0)
MCHC: 33.7 g/dL (ref 31.5–35.7)
MCV: 88 fL (ref 79–97)
Platelets: 327 10*3/uL (ref 150–450)
RBC: 4.72 x10E6/uL (ref 3.77–5.28)
RDW: 11.3 % — ABNORMAL LOW (ref 11.7–15.4)
WBC: 5.5 10*3/uL (ref 3.4–10.8)

## 2021-08-04 LAB — BASIC METABOLIC PANEL
BUN/Creatinine Ratio: 13 (ref 9–23)
BUN: 11 mg/dL (ref 6–20)
CO2: 25 mmol/L (ref 20–29)
Calcium: 9.9 mg/dL (ref 8.7–10.2)
Chloride: 102 mmol/L (ref 96–106)
Creatinine, Ser: 0.82 mg/dL (ref 0.57–1.00)
Glucose: 91 mg/dL (ref 65–99)
Potassium: 5.1 mmol/L (ref 3.5–5.2)
Sodium: 141 mmol/L (ref 134–144)
eGFR: 105 mL/min/{1.73_m2} (ref >59)

## 2021-08-04 LAB — VITAMIN B12: Vitamin B-12: 449 pg/mL (ref 232–1245)

## 2021-08-04 LAB — VITAMIN D 25 HYDROXY (VIT D DEFICIENCY, FRACTURES): Vit D, 25-Hydroxy: 53.1 ng/mL (ref 30.0–100.0)

## 2021-08-04 LAB — TSH: TSH: 1.02 u[IU]/mL (ref 0.450–4.500)

## 2021-08-04 LAB — HEMOGLOBIN A1C
Est. average glucose Bld gHb Est-mCnc: 103 mg/dL
Hgb A1c MFr Bld: 5.2 % (ref 4.8–5.6)

## 2021-08-05 ENCOUNTER — Other Ambulatory Visit: Payer: Self-pay | Admitting: Primary Care

## 2021-08-05 DIAGNOSIS — N3001 Acute cystitis with hematuria: Secondary | ICD-10-CM

## 2021-08-05 LAB — URINE CULTURE

## 2021-08-05 MED ORDER — SULFAMETHOXAZOLE-TRIMETHOPRIM 800-160 MG PO TABS: 1.0000 | ORAL_TABLET | Freq: Two times a day (BID) | ORAL | 0 refills | Status: DC

## 2021-08-14 ENCOUNTER — Ambulatory Visit
Admission: EM | Admit: 2021-08-14 | Discharge: 2021-08-14 | Disposition: A | Discharge status: Home / Self Care | Payer: 59 | Attending: Emergency Medicine | Admitting: Emergency Medicine

## 2021-08-14 ENCOUNTER — Encounter: Payer: Self-pay | Admitting: Emergency Medicine

## 2021-08-14 DIAGNOSIS — R1310 Dysphagia, unspecified: Secondary | ICD-10-CM

## 2021-08-14 MED ORDER — LIDOCAINE VISCOUS HCL 2 % MT SOLN
15.0000 mL | Freq: Once | OROMUCOSAL | Status: AC
  Administered 2021-08-14: 15 mL via ORAL

## 2021-08-14 MED ORDER — ALUM & MAG HYDROXIDE-SIMETH 200-200-20 MG/5ML PO SUSP
30.0000 mL | Freq: Once | ORAL | Status: AC
  Administered 2021-08-14: 30 mL via ORAL

## 2021-08-14 MED ORDER — LIDOCAINE VISCOUS HCL 2 % MT SOLN: 15.0000 mL | OROMUCOSAL | 0 refills | Status: DC | PRN

## 2021-08-14 NOTE — Discharge Instructions (Signed)
Symptoms may be related to inflammation in the esophagus caused by taking medication at night.  Take medication 1-2 hours before laying down at night.  Take with a full glass of water Viscous lidocaine prescribed.  This is an oral solution you can swish (or swallow), and gargle as needed for symptomatic relief of sore throat.  Do not exceed 8 doses in a 24 hour period.  Do not use prior to eating, as this will numb your entire mouth.   Drink warm or cool liquids, use throat lozenges, or popsicles to help alleviate symptoms Continue with acid reflux medication Follow up with PCP for further evaluation and management Return or go to ER if patient has any new or worsening symptoms such as fever, chills, nausea, vomiting, worsening sore throat, difficulty swallowing, vomiting, trouble breathing, cough, abdominal pain, chest pain, changes in bowel or bladder habits, etc..Marland Kitchen

## 2021-08-14 NOTE — ED Triage Notes (Signed)
Pt presents today with c/o of "I feel like there is something in my throat" x 3 days. Denies choking on food. She does report starting new medication Topamax for migraines two weeks ago.

## 2021-08-14 NOTE — ED Provider Notes (Signed)
[facility]   [id number] 08/14/21 Arrival Time: 5170  YF:[id number] in throat  SUBJECTIVE: History from: patient.  Andrea Wilson is a 20 y.o. female who presents with feeling like "something is stuck in her throat" x 3 days.  Denies precipitating event or trauma.  Has been taking topamax for the past 2 weeks at night.  Has tried acid reflux medication without relief.  Symptoms are made worse at night.  Denies previous symptoms in the past.   Complains of being' more "gassy."  Denies fever, chills, fatigue, ear pain, sinus pain, rhinorrhea, nasal congestion, cough, SOB, wheezing, chest pain, nausea, rash, changes in bowel or bladder habits.    ROS: As per HPI.  All other pertinent ROS negative.     Past Medical History:  Diagnosis Date   Allergy    Anxiety    Depression    Frequent headaches    Hypotension    Past Surgical History:  Procedure Laterality Date   KNEE SURGERY     TOOTH EXTRACTION     No Known Allergies No current facility-administered medications on file prior to encounter.   Current Outpatient Medications on File Prior to Encounter  Medication Sig Dispense Refill   topiramate (TOPAMAX) 25 MG tablet TAKE 1 TABLET(25 MG) BY MOUTH AT BEDTIME FOR MIGRAINE PREVENTION 90 tablet 0   aspirin-acetaminophen-caffeine (EXCEDRIN MIGRAINE) 250-250-65 MG tablet Take 2 tablets by mouth every 6 (six) hours as needed for headache.     hydrOXYzine (ATARAX/VISTARIL) 25 MG tablet Take 1 tablet (25 mg total) by mouth 2 (two) times daily as needed for anxiety. 60 tablet 0   ibuprofen (ADVIL,MOTRIN) 800 MG tablet      LO LOESTRIN FE 1 MG-10 MCG / 10 MCG tablet Take 1 tablet by mouth daily.     ondansetron (ZOFRAN) 4 MG tablet Take 1 tablet (4 mg total) by mouth every 6 (six) hours. 20 tablet 0   sulfamethoxazole-trimethoprim (BACTRIM DS) 800-160 MG tablet Take 1 tablet by mouth 2 (two) times daily. For urinary tract infection. 6 tablet 0   SUMAtriptan (IMITREX) 50 MG tablet  TAKE 1 TABLET WITH 400-600 MG OF IBUPROFEN FOR MODERATE TO SEVERE HEADACHE, MAXIMUM 2 TIMES A WEEK 10 tablet 0   venlafaxine XR (EFFEXOR-XR) 150 MG 24 hr capsule TAKE 1 CAPSULE(150 MG) BY MOUTH DAILY WITH BREAKFAST for anxiety. 90 capsule 3   Social History   Socioeconomic History   Marital status: Single    Spouse name: Not on file   Number of children: Not on file   Years of education: Not on file   Highest education level: Not on file  Occupational History   Not on file  Tobacco Use   Smoking status: Never   Smokeless tobacco: Never  Substance and Sexual Activity   Alcohol use: No   Drug use: No   Sexual activity: Never  Other Topics Concern   Not on file  Social History Narrative   12th grade student at  [employer]. She does well in school.   Lives with mother and brother. Her sister passed away in 29-Mar-2013 at age 103 from cancer.   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    Social Determinants of Health   Financial Resource Strain: Not on file  Food Insecurity: Not on file  Transportation Needs: Not on file  Physical Activity: Not on file  Stress: Not on file  Social Connections: Not on file  Intimate Partner Violence: Not on file  Family History  Problem Relation Age of Onset   Hyperlipidemia Mother    Anxiety disorder Mother    Depression Mother    Hypertension Father    Cancer Sister    Uterine cancer Maternal Grandmother    Lung cancer Paternal Grandfather    Migraines Other        Mfhx of migraine   Seizures Cousin    Autism Cousin     OBJECTIVE:  Vitals:   08/14/21 0847  BP: 111/76  Pulse: (!) 111  Resp: 18  Temp: 98.4 F (36.9 C)  SpO2: 95%    General appearance: alert; well-appearing, nontoxic; speaking in full sentences and tolerating own secretions HEENT: NCAT; Ears: EACs clear; Eyes: PERRL.  EOM grossly intact.Nose: nares patent without rhinorrhea, Throat: oropharynx clear, tonsils non erythematous or enlarged, uvula midline   Neck: supple without LAD; no thyromegaly Lungs: unlabored respirations, symmetrical air entry; cough: absent; no respiratory distress; CTAB Heart: Tachycardia Skin: warm and dry Psychological: alert and cooperative; anxious mood and affect   ASSESSMENT & PLAN:  1. Dysphagia, unspecified type     Meds ordered this encounter  Medications   lidocaine (XYLOCAINE) 2 % solution    Sig: Use as directed 15 mLs in the mouth or throat as needed for mouth pain (Do NOT exceed 8 doses in a 24 hour period).    Dispense:  100 mL    Refill:  0    Order Specific Question:   Supervising Provider    Answer:   Eustace Moore [[id number]]   AND Linked Order Group    alum & mag hydroxide-simeth (MAALOX/MYLANTA) 200-200-20 MG/5ML suspension 30 mL    lidocaine (XYLOCAINE) 2 % viscous mouth solution 15 mL   Symptoms may be related to inflammation in the esophagus caused by taking medication at night.  Take medication 1-2 hours before laying down at night.  Take with a full glass of water Viscous lidocaine prescribed.  This is an oral solution you can swish (or swallow), and gargle as needed for symptomatic relief of sore throat.  Do not exceed 8 doses in a 24 hour period.  Do not use prior to eating, as this will numb your entire mouth.   Drink warm or cool liquids, use throat lozenges, or popsicles to help alleviate symptoms Continue with acid reflux medication Follow up with PCP for further evaluation and management Return or go to ER if patient has any new or worsening symptoms such as fever, chills, nausea, vomiting, worsening sore throat, difficulty swallowing, vomiting, trouble breathing, cough, abdominal pain, chest pain, changes in bowel or bladder habits, etc...  Reviewed expectations re: course of current medical issues. Questions answered. Outlined signs and symptoms indicating need for more acute intervention. Patient verbalized understanding. After Visit Summary given.         Rennis Harding, PA-C 08/14/21 [phone]

## 2021-08-17 ENCOUNTER — Encounter: Payer: Self-pay | Admitting: Primary Care

## 2021-08-17 ENCOUNTER — Other Ambulatory Visit: Payer: Self-pay

## 2021-08-17 ENCOUNTER — Ambulatory Visit (INDEPENDENT_AMBULATORY_CARE_PROVIDER_SITE_OTHER): Payer: 59 | Admitting: Primary Care

## 2021-08-17 VITALS — BP 110/74 | HR 100 | Temp 98.6°F | Ht 65.0 in | Wt 126.0 lb

## 2021-08-17 DIAGNOSIS — R102 Pelvic and perineal pain: Secondary | ICD-10-CM

## 2021-08-17 DIAGNOSIS — G43009 Migraine without aura, not intractable, without status migrainosus: Secondary | ICD-10-CM | POA: Diagnosis not present

## 2021-08-17 DIAGNOSIS — R0989 Other specified symptoms and signs involving the circulatory and respiratory systems: Secondary | ICD-10-CM | POA: Insufficient documentation

## 2021-08-17 DIAGNOSIS — R09A2 Foreign body sensation, throat: Secondary | ICD-10-CM

## 2021-08-17 HISTORY — DX: Pelvic and perineal pain: R10.2

## 2021-08-17 HISTORY — DX: Other specified symptoms and signs involving the circulatory and respiratory systems: R09.89

## 2021-08-17 HISTORY — DX: Foreign body sensation, throat: R09.A2

## 2021-08-17 NOTE — Progress Notes (Signed)
Subjective:    Patient ID: Andrea Wilson, female    DOB: [birth year], 20 y.o.   MRN: [id number]  HPI  Andrea Wilson is a very pleasant 20 y.o. female with a history of anxiety, depression, chronic headaches who presents today to discuss   Evaluated at Urgent Care on 08/14/21 for sensation of foreign body in throat. No improvement with OTC reflux medication. Symptoms worse at night. She was prescribed lidocaine 2% solution and Maalox. Symptoms were suspected to be secondary to GERD.   Today she endorses a sensation of something in her lower throat. She was told at urgent care that there may be a pill stuck in her throat, she doesn't believe this to be true. She denies choking, trouble swallowing her pills, cough, burning in her chest.   Symptom are worse with drinking soda and at night. She's been taking generic acid reducer OTC without improvement. Today she's slightly better.   She was also recently diagnosed with chlamydia and bacterial vaginosis. She was prescribed Doxycycline for which has not picked up, has picked up metronidazole gel. She thinks the chlamydia test was a false positive. Her symptoms from last visit have resolved, she denies vaginal discharge and itching. She does notice upper pelvic cramping since staring metronidazole gel for BV. Her boyfriend tested negative for chlamydia. She has not had intercourse with anyone except for him.   Review of Systems  HENT:  Negative for trouble swallowing.   Gastrointestinal:  Negative for abdominal pain.       Sensation of something in her throat.   Genitourinary:  Negative for dysuria and hematuria.        Past Medical History:  Diagnosis Date   Allergy    Anxiety    Depression    Frequent headaches    Hypotension     Social History   Socioeconomic History   Marital status: Single    Spouse name: Not on file   Number of children: Not on file   Years of education: Not on file   Highest education level: Not on  file  Occupational History   Not on file  Tobacco Use   Smoking status: Never   Smokeless tobacco: Never  Substance and Sexual Activity   Alcohol use: No   Drug use: No   Sexual activity: Never  Other Topics Concern   Not on file  Social History Narrative   12th grade student at  [employer]. She does well in school.   Lives with mother and brother. Her sister passed away in 03/18/2013 at age 21 from cancer.   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    Social Determinants of Health   Financial Resource Strain: Not on file  Food Insecurity: Not on file  Transportation Needs: Not on file  Physical Activity: Not on file  Stress: Not on file  Social Connections: Not on file  Intimate Partner Violence: Not on file    Past Surgical History:  Procedure Laterality Date   KNEE SURGERY     TOOTH EXTRACTION      Family History  Problem Relation Age of Onset   Hyperlipidemia Mother    Anxiety disorder Mother    Depression Mother    Hypertension Father    Cancer Sister    Uterine cancer Maternal Grandmother    Lung cancer Paternal Grandfather    Migraines Other        Mfhx of migraine   Seizures Cousin  Autism Cousin     No Known Allergies  Current Outpatient Medications on File Prior to Visit  Medication Sig Dispense Refill   aspirin-acetaminophen-caffeine (EXCEDRIN MIGRAINE) 250-250-65 MG tablet Take 2 tablets by mouth every 6 (six) hours as needed for headache.     hydrOXYzine (ATARAX/VISTARIL) 25 MG tablet Take 1 tablet (25 mg total) by mouth 2 (two) times daily as needed for anxiety. 60 tablet 0   ibuprofen (ADVIL,MOTRIN) 800 MG tablet      LO LOESTRIN FE 1 MG-10 MCG / 10 MCG tablet Take 1 tablet by mouth daily.     metroNIDAZOLE (METROGEL) 0.75 % vaginal gel SMARTSIG:1 Applicator Vaginal Daily     ondansetron (ZOFRAN) 4 MG tablet Take 1 tablet (4 mg total) by mouth every 6 (six) hours. 20 tablet 0   SUMAtriptan (IMITREX) 50 MG tablet TAKE 1 TABLET WITH  400-600 MG OF IBUPROFEN FOR MODERATE TO SEVERE HEADACHE, MAXIMUM 2 TIMES A WEEK 10 tablet 0   topiramate (TOPAMAX) 25 MG tablet TAKE 1 TABLET(25 MG) BY MOUTH AT BEDTIME FOR MIGRAINE PREVENTION 90 tablet 0   venlafaxine XR (EFFEXOR-XR) 150 MG 24 hr capsule TAKE 1 CAPSULE(150 MG) BY MOUTH DAILY WITH BREAKFAST for anxiety. 90 capsule 3   lidocaine (XYLOCAINE) 2 % solution Use as directed 15 mLs in the mouth or throat as needed for mouth pain (Do NOT exceed 8 doses in a 24 hour period). (Patient not taking: Reported on 08/17/2021) 100 mL 0   No current facility-administered medications on file prior to visit.    BP 110/74   Pulse 100   Temp 98.6 F (37 C) (Temporal)   Ht 5\' 5"  (1.651 m)   Wt 126 lb (57.2 kg)   LMP  (LMP Unknown)   SpO2 98%   BMI 20.97 kg/m  Objective:   Physical Exam Cardiovascular:     Rate and Rhythm: Normal rate and regular rhythm.  Pulmonary:     Effort: Pulmonary effort is normal.     Breath sounds: Normal breath sounds.  Abdominal:     Palpations: Abdomen is soft.     Tenderness: There is no abdominal tenderness.  Musculoskeletal:     Cervical back: Neck supple.  Skin:    General: Skin is warm and dry.          Assessment & Plan:      This visit occurred during the SARS-CoV-2 public health emergency.  Safety protocols were in place, including screening questions prior to the visit, additional usage of staff PPE, and extensive cleaning of exam room while observing appropriate contact time as indicated for disinfecting solutions.

## 2021-08-17 NOTE — Addendum Note (Signed)
Addended by: Alvina Chou on: 08/17/2021 10:57 AM   Modules accepted: Orders

## 2021-08-17 NOTE — Assessment & Plan Note (Signed)
Diagnosed with chlamydia and BV at GYN office a few days ago.   Recheck labs for gonorrhea and chlamydia as requested. She has not started treatment for chlamydia.

## 2021-08-17 NOTE — Assessment & Plan Note (Signed)
Could be side effect from Topamax vs GERD.  Will have her hold Topamax for now. Consider short term PPI if needed. She will update.

## 2021-08-17 NOTE — Assessment & Plan Note (Signed)
Will hold Topamax 25 mg as this could be causing her lower throat symptoms.  She will update.

## 2021-08-17 NOTE — Patient Instructions (Signed)
Stop by the lab prior to leaving today. I will notify you of your results once received.   Stop the Topamax for a few days to see if this helps your symptoms.   You could try omeprazole 20 mg nightly for heartburn but stop the Topamax first.   Please update me via my chart.   It was a pleasure to see you today!

## 2021-08-19 LAB — TRICHOMONAS VAGINALIS, PROBE AMP: Trich vag by NAA: NEGATIVE

## 2021-08-20 ENCOUNTER — Telehealth: Payer: Self-pay

## 2021-08-20 NOTE — Telephone Encounter (Signed)
Noted and agree with omeprazole 20 mg and other recommendations

## 2021-08-20 NOTE — Telephone Encounter (Signed)
I spoke with pt; pt said now she feels OK right now; pt having reflux; describe like something in her throat that is going up and down. Pt does not want to go to UC as advised by access nurse and pt is going to [facility] to get a different anti acid. Pt was seen on 08/17/21 by Allayne Gitelman NP. Pt did stop Topamax on 08/17/21 and cannot tell any difference in how she feels and pt is going to get omeprazole 20 mg now. Pt has not had CP or tightness. Pt does have slight SOB and a pressure feeling in chest all the time. Pt is speaking in complete sentences and does not sound in any distress at this time. Pt is going to cb if condition changes or worsens and UC & ED precautions given and pt voiced understanding.pt request cb after Allayne Gitelman NP reviews this note. Sending note to Allayne Gitelman NP and [facility] CMA. Will also teams Joellen.

## 2021-08-20 NOTE — Telephone Encounter (Signed)
[facility] - Client TELEPHONE ADVICE RECORD AccessNurse Patient Name: Andrea Wilson Gender: Female DOB: Jul 09, 2001 Age: 20 Y 10 M 25 D Return Phone Number: [phone] (Primary), [phone] (Secondary) Address: City/ State/ Zip: [place] Client [facility] Day - Client Client Site [facility] - Day Physician Vernona Rieger - NP Contact Type Call Who Is Calling Patient / Member / Family / Caregiver Call Type Triage / Clinical Relationship To Patient Self Return Phone Number [phone] (Secondary) Chief Complaint CHEST PAIN - pain, pressure, heaviness or tightness Reason for Call Symptomatic / Request for Health Information Initial Comment Caller states she has acid reflux Sx, causing her to vomit. Translation No Nurse Assessment Nurse: Annye English, RN, Denise Date/Time (Eastern Time): 08/20/2021 8:42:53 AM Confirm and document reason for call. If symptomatic, describe symptoms. ---Caller states she has acid reflux which caused her to vomit last pm. Antiacid med not helping. Does the patient have any new or worsening symptoms? ---Yes Will a triage be completed? ---Yes Related visit to physician within the last 2 weeks? ---Yes Does the PT have any chronic conditions? (i.e. diabetes, asthma, this includes High risk factors for pregnancy, etc.) ---Yes List chronic conditions. ---GERD Is the patient pregnant or possibly pregnant? (Ask all females between the ages of 62-55) ---No Is this a behavioral health or substance abuse call? ---No Guidelines Guideline Title Affirmed Question Affirmed Notes Nurse Date/Time Lamount Cohen Time) Abdominal Pain - Upper [1] MILDMODERATE pain AND [2] not relieved by antacid medicine Carmon, RN, Angelique Blonder 08/20/2021 8:44:13 AM Disp. Time Lamount Cohen Time) Disposition Final User 08/20/2021 8:38:11 AM Send to Urgent Queue Carley Hammed PLEASE NOTE: All timestamps  contained within this report are represented as [place] Standard Time. CONFIDENTIALTY NOTICE: This fax transmission is intended only for the addressee. It contains information that is legally privileged, confidential or otherwise protected from use or disclosure. If you are not the intended recipient, you are strictly prohibited from reviewing, disclosing, copying using or disseminating any of this information or taking any action in reliance on or regarding this information. If you have received this fax in error, please notify us immediately by telephone so that we can arrange for its return to [place]. Phone: [phone], Toll-Free: [phone], Fax: [phone] Page: 2 of 2 Call Id: [id number] 08/20/2021 8:51:12 AM See HCP within 4 Hours (or PCP triage) Yes Carmon, RN, Leighton Ruff Disagree/Comply Comply Caller Understands Yes PreDisposition Call Doctor Care Advice Given Per Guideline SEE HCP (OR PCP TRIAGE) WITHIN 4 HOURS: CALL BACK IF: * You become worse CARE ADVICE given per Abdominal Pain, Upper (Adult) guideline. Comments User: Greggory Stallion, RN Date/Time Lamount Cohen Time): 08/20/2021 8:51:08 AM Called [facility] for appt w/o answer. Trans pt to the main number. Referrals REFERRED TO PCP OFFICE

## 2021-08-25 LAB — CT, NG, MYCOPLASMAS NAA, URINE
Chlamydia trachomatis, NAA: NEGATIVE
Mycoplasma genitalium NAA: NEGATIVE
Mycoplasma hominis NAA: POSITIVE — AB
Neisseria gonorrhoeae, NAA: NEGATIVE
Ureaplasma spp NAA: POSITIVE — AB

## 2021-08-30 ENCOUNTER — Emergency Department
Admission: EM | Admit: 2021-08-30 | Discharge: 2021-08-30 | Disposition: A | Discharge status: Home / Self Care | Payer: 59 | Attending: Emergency Medicine | Admitting: Emergency Medicine

## 2021-08-30 ENCOUNTER — Telehealth: Payer: Self-pay | Admitting: *Deleted

## 2021-08-30 ENCOUNTER — Other Ambulatory Visit: Payer: Self-pay

## 2021-08-30 DIAGNOSIS — Z3A00 Weeks of gestation of pregnancy not specified: Secondary | ICD-10-CM | POA: Diagnosis not present

## 2021-08-30 DIAGNOSIS — O219 Vomiting of pregnancy, unspecified: Secondary | ICD-10-CM | POA: Diagnosis not present

## 2021-08-30 DIAGNOSIS — O99619 Diseases of the digestive system complicating pregnancy, unspecified trimester: Secondary | ICD-10-CM | POA: Insufficient documentation

## 2021-08-30 DIAGNOSIS — R7309 Other abnormal glucose: Secondary | ICD-10-CM | POA: Diagnosis not present

## 2021-08-30 DIAGNOSIS — K219 Gastro-esophageal reflux disease without esophagitis: Secondary | ICD-10-CM | POA: Insufficient documentation

## 2021-08-30 DIAGNOSIS — R4182 Altered mental status, unspecified: Secondary | ICD-10-CM | POA: Diagnosis not present

## 2021-08-30 LAB — COMPREHENSIVE METABOLIC PANEL
ALT: 13 U/L (ref 0–44)
AST: 16 U/L (ref 15–41)
Albumin: 4.4 g/dL (ref 3.5–5.0)
Alkaline Phosphatase: 43 U/L (ref 38–126)
Anion gap: 7 (ref 5–15)
BUN: 9 mg/dL (ref 6–20)
CO2: 23 mmol/L (ref 22–32)
Calcium: 9.3 mg/dL (ref 8.9–10.3)
Chloride: 108 mmol/L (ref 98–111)
Creatinine, Ser: 0.72 mg/dL (ref 0.44–1.00)
GFR, Estimated: >60 mL/min (ref >60)
Glucose, Bld: 95 mg/dL (ref 70–99)
Potassium: 3.5 mmol/L (ref 3.5–5.1)
Sodium: 138 mmol/L (ref 135–145)
Total Bilirubin: 0.3 mg/dL (ref 0.3–1.2)
Total Protein: 7.4 g/dL (ref 6.5–8.1)

## 2021-08-30 LAB — CBC
HCT: 37.4 % (ref 36.0–46.0)
Hemoglobin: 13.1 g/dL (ref 12.0–15.0)
MCH: 31.1 pg (ref 26.0–34.0)
MCHC: 35 g/dL (ref 30.0–36.0)
MCV: 88.8 fL (ref 80.0–100.0)
Platelets: 281 10*3/uL (ref 150–400)
RBC: 4.21 MIL/uL (ref 3.87–5.11)
RDW: 11.9 % (ref 11.5–15.5)
WBC: 4.9 10*3/uL (ref 4.0–10.5)
nRBC: 0 % (ref 0.0–0.2)

## 2021-08-30 LAB — POC URINE PREG, ED: Preg Test, Ur: POSITIVE — AB

## 2021-08-30 LAB — LIPASE, BLOOD: Lipase: 21 U/L (ref 11–51)

## 2021-08-30 MED ORDER — LORAZEPAM 0.5 MG PO TABS: 0.5000 mg | ORAL_TABLET | Freq: Once | ORAL | Status: DC

## 2021-08-30 MED ORDER — ALUM & MAG HYDROXIDE-SIMETH 200-200-20 MG/5ML PO SUSP
30.0000 mL | Freq: Once | ORAL | Status: AC
  Administered 2021-08-30: 30 mL via ORAL
  Filled 2021-08-30: qty 30

## 2021-08-30 MED ORDER — LIDOCAINE VISCOUS HCL 2 % MT SOLN
15.0000 mL | Freq: Once | OROMUCOSAL | Status: AC
  Administered 2021-08-30: 15 mL via ORAL
  Filled 2021-08-30: qty 15

## 2021-08-30 MED ORDER — FAMOTIDINE 20 MG PO TABS
40.0000 mg | ORAL_TABLET | Freq: Once | ORAL | Status: AC
  Administered 2021-08-30: 40 mg via ORAL
  Filled 2021-08-30: qty 2

## 2021-08-30 NOTE — Telephone Encounter (Signed)
Noted  

## 2021-08-30 NOTE — Discharge Instructions (Addendum)
You can continue taking Pepcid at home for acid reflux. Please establish care with Dr. Senaida Ores

## 2021-08-30 NOTE — Telephone Encounter (Signed)
Patient  is currently at [facility] ED. 

## 2021-08-30 NOTE — ED Triage Notes (Signed)
Pt comes with c/o acid reflux with no relief wit meds and some confusion. Pt answering questions at this time appropriately.  Mom reports pt has anxiety and feels that gets her little confused.

## 2021-08-30 NOTE — ED Provider Notes (Signed)
[facility]-EMERGENCY DEPARTMENT  ____________________________________________  Time seen: Approximately 7:11 PM  I have reviewed the triage vital signs and the nursing notes.   HISTORY  Chief Complaint Gastroesophageal Reflux and Altered Mental Status   Historian Patient     HPI  Andrea Wilson is a 20 y.o. female presents to the emergency department with worsening acid reflux.  Patient states that her acid reflux is normally well managed with omeprazole but states that she has had persistent discomfort over the past several days.  She denies current chest pain, chest tightness or shortness of breath.  She states that she has had 1 episode of vomiting but is not currently nauseous.  No fever or chills at home.  Denies rhinorrhea, nasal congestion or nonproductive cough.   Past Medical History:  Diagnosis Date   Allergy    Anxiety    Depression    Frequent headaches    Hypotension      Immunizations up to date:  Yes.     Past Medical History:  Diagnosis Date   Allergy    Anxiety    Depression    Frequent headaches    Hypotension     Patient Active Problem List   Diagnosis Date Noted   Sensation of foreign body in throat 08/17/2021   Pelvic cramping 08/17/2021   Fatigue 08/03/2021   Sinus pressure 07/07/2021   Axillary pain, left 06/16/2020   Vaginal itching 09/24/2019   Chronic cystitis 09/13/2019   Nausea 05/22/2019   Abnormal thyroid function test 05/22/2019   Rash and nonspecific skin eruption 04/04/2019   Hemorrhoid 02/11/2019   Chest pain 08/23/2018   Screening examination for STD (sexually transmitted disease) 05/08/2017   Migraine without aura and without status migrainosus, not intractable 11/08/2016   Chronic headaches 10/13/2016   Anxiety and depression 04/30/2015    Past Surgical History:  Procedure Laterality Date   KNEE SURGERY     TOOTH EXTRACTION      Prior to Admission medications   Medication Sig Start Date End Date Taking?  Authorizing Provider  aspirin-acetaminophen-caffeine (EXCEDRIN MIGRAINE) [phone] MG tablet Take 2 tablets by mouth every 6 (six) hours as needed for headache.    [provider]  hydrOXYzine (ATARAX/VISTARIL) 25 MG tablet Take 1 tablet (25 mg total) by mouth 2 (two) times daily as needed for anxiety. 03/24/21   Doreene Nest, NP  ibuprofen (ADVIL,MOTRIN) 800 MG tablet  06/23/17   [provider]  lidocaine (XYLOCAINE) 2 % solution Use as directed 15 mLs in the mouth or throat as needed for mouth pain (Do NOT exceed 8 doses in a 24 hour period). Patient not taking: Reported on 08/17/2021 08/14/21   Wurst, [place], PA-C  LO LOESTRIN FE 1 MG-10 MCG / 10 MCG tablet Take 1 tablet by mouth daily. 03/25/21   [provider]  metroNIDAZOLE (METROGEL) 0.75 % vaginal gel SMARTSIG:1 Applicator Vaginal Daily 08/13/21   [provider]  ondansetron (ZOFRAN) 4 MG tablet Take 1 tablet (4 mg total) by mouth every 6 (six) hours. 03/08/21   Bing Neighbors, FNP  SUMAtriptan (IMITREX) 50 MG tablet TAKE 1 TABLET WITH 400-600 MG OF IBUPROFEN FOR MODERATE TO SEVERE HEADACHE, MAXIMUM 2 TIMES A WEEK 07/13/20   Lorre Munroe, NP  topiramate (TOPAMAX) 25 MG tablet TAKE 1 TABLET(25 MG) BY MOUTH AT BEDTIME FOR MIGRAINE PREVENTION 08/04/21   Doreene Nest, NP  venlafaxine XR (EFFEXOR-XR) 150 MG 24 hr capsule TAKE 1 CAPSULE(150 MG) BY MOUTH DAILY  WITH BREAKFAST for anxiety. 02/24/21   Doreene Nest, NP    Allergies Patient has no known allergies.  Family History  Problem Relation Age of Onset   Hyperlipidemia Mother    Anxiety disorder Mother    Depression Mother    Hypertension Father    Cancer Sister    Uterine cancer Maternal Grandmother    Lung cancer Paternal Grandfather    Migraines Other        Mfhx of migraine   Seizures Cousin    Autism Cousin     Social History Social History   Tobacco Use   Smoking status: Never   Smokeless tobacco: Never  Substance  Use Topics   Alcohol use: No   Drug use: No     Review of Systems  Constitutional: No fever/chills Eyes:  No discharge ENT: No upper respiratory complaints. Respiratory: no cough. No SOB/ use of accessory muscles to breath Gastrointestinal:   No nausea, no vomiting.  No diarrhea.  No constipation. Musculoskeletal: Negative for musculoskeletal pain. Skin: Negative for rash, abrasions, lacerations, ecchymosis.    ____________________________________________   PHYSICAL EXAM:  VITAL SIGNS: ED Triage Vitals  Enc Vitals Group     BP 08/30/21 1335 113/83     Pulse Rate 08/30/21 1335 (!) 105     Resp 08/30/21 1335 20     Temp 08/30/21 1335 98.7 F (37.1 C)     Temp Source 08/30/21 1335 Oral     SpO2 08/30/21 1335 99 %     Weight 08/30/21 1335 125 lb (56.7 kg)     Height 08/30/21 1335 5\' 5"  (1.651 m)     Head Circumference --      Peak Flow --      Pain Score 08/30/21 1342 3     Pain Loc --      Pain Edu? --      Excl. in GC? --      Constitutional: Alert and oriented. Well appearing and in no acute distress. Eyes: Conjunctivae are normal. PERRL. EOMI. Head: Atraumatic. ENT:      Nose: No congestion/rhinnorhea.      Mouth/Throat: Mucous membranes are moist.  Neck: No stridor.  No cervical spine tenderness to palpation. Cardiovascular: Normal rate, regular rhythm. Normal S1 and S2.  Good peripheral circulation. Respiratory: Normal respiratory effort without tachypnea or retractions. Lungs CTAB. Good air entry to the bases with no decreased or absent breath sounds Gastrointestinal: Bowel sounds x 4 quadrants. Soft and nontender to palpation. No guarding or rigidity. No distention. Musculoskeletal: Full range of motion to all extremities. No obvious deformities noted Neurologic:  Normal for age. No gross focal neurologic deficits are appreciated.  Skin:  Skin is warm, dry and intact. No rash noted. Psychiatric: Mood and affect are normal for age. Speech and behavior are  normal.   ____________________________________________   LABS (all labs ordered are listed, but only abnormal results are displayed)  Labs Reviewed  POC URINE PREG, ED - Abnormal; Notable for the following components:      Result Value   Preg Test, Ur Positive (*)    All other components within normal limits  COMPREHENSIVE METABOLIC PANEL  CBC  LIPASE, BLOOD  CBG MONITORING, ED   ____________________________________________  EKG   ____________________________________________  RADIOLOGY   No results found.  ____________________________________________    PROCEDURES  Procedure(s) performed:     Procedures     Medications  LORazepam (ATIVAN) tablet 0.5 mg (0.5 mg Oral Not Given 08/30/21  1844)  alum & mag hydroxide-simeth (MAALOX/MYLANTA) 200-200-20 MG/5ML suspension 30 mL (30 mLs Oral Given 08/30/21 1749)    And  lidocaine (XYLOCAINE) 2 % viscous mouth solution 15 mL (15 mLs Oral Given 08/30/21 1749)  famotidine (PEPCID) tablet 40 mg (40 mg Oral Given 08/30/21 1749)     ____________________________________________   INITIAL IMPRESSION / ASSESSMENT AND PLAN / ED COURSE  Pertinent labs & imaging results that were available during my care of the patient were reviewed by me and considered in my medical decision making (see chart for details).     Assessment and plan Acid reflux Pregnancy 20 year old female presents to the emergency department with worsening acid reflux over the past 2 to 3 days.  Vital signs are reassuring in triage.  On physical exam, patient was alert, active and nontoxic-appearing.  Urine pregnancy test was positive.  Patient was unaware of positive pregnancy.  Recommended establishing care with OB/GYN, Dr. Senaida Ores.  Advise discontinuing doxycycline and Topamax until patient can talk with her OB/GYN.  Patient voiced understanding and agrees with plan.  She denies pelvic pain or abdominal pain at this  time.    ____________________________________________  FINAL CLINICAL IMPRESSION(S) / ED DIAGNOSES  Final diagnoses:  Gastroesophageal reflux disease, unspecified whether esophagitis present      NEW MEDICATIONS STARTED DURING THIS VISIT:  ED Discharge Orders     None           This chart was dictated using voice recognition software/Dragon. Despite best efforts to proofread, errors can occur which can change the meaning. Any change was purely unintentional.     Orvil Feil, PA-C 08/30/21 1919    Phineas Semen, MD 08/30/21 [phone]

## 2021-08-30 NOTE — Telephone Encounter (Signed)
PLEASE NOTE: All timestamps contained within this report are represented as [place] Standard Time. CONFIDENTIALTY NOTICE: This fax transmission is intended only for the addressee. It contains information that is legally privileged, confidential or otherwise protected from use or disclosure. If you are not the intended recipient, you are strictly prohibited from reviewing, disclosing, copying using or disseminating any of this information or taking any action in reliance on or regarding this information. If you have received this fax in error, please notify us immediately by telephone so that we can arrange for its return to [place]. Phone: [phone], Toll-Free: [phone], Fax: [phone] Page: 1 of 2 Call Id: [id number] [facility] Day - Client TELEPHONE ADVICE RECORD Access[facility]urse Patient Name: Andrea Wilson Gender: Female DOB: 07-20-2001 Age: 20 Y 11 M 4 D Return Phone Number: [phone] (Secondary) Address: City/ State/ Zip: [place] [address] Client [facility] Day - Client Client Site [facility] - Day Physician Vernona Rieger - NP Contact Type Call Who Is Calling Patient / Member / Family / Caregiver Call Type Triage / Clinical Relationship To Patient Self Return Phone Number [phone] (Secondary) Chief Complaint CHEST PAIN - pain, pressure, heaviness or tightness Reason for Call Symptomatic / Request for Health Information Initial Comment Caller has acid reflux, nausea, and chest pain. Translation No Nurse Assessment Nurse: Vear Clock, RN, Elease Hashimoto Date/Time Lamount Cohen Time): 08/30/2021 12:37:56 PM Confirm and document reason for call. If symptomatic, describe symptoms. ---She has acid reflux, nausea, and chest pressure. She had the s&s for about 2 to 3 weeks even while she is on medications. it is worse today. She does not want to eat. vomited x 1 about a week ago Does the patient have  any new or worsening symptoms? ---Yes Will a triage be completed? ---Yes Related visit to physician within the last 2 weeks? ---Yes Does the PT have any chronic conditions? (i.e. diabetes, asthma, this includes High risk factors for pregnancy, etc.) ---Yes List chronic conditions. ---reflux, migraine, anxeity Is the patient pregnant or possibly pregnant? (Ask all females between the ages of 40-55) ---No Is this a behavioral health or substance abuse call? ---No Guidelines Guideline Title Affirmed Question Affirmed Notes Nurse Date/Time (Eastern Time) Chest Pain Difficult to awaken or acting confused (e.g., disoriented, slurred speech) Vear Clock, RN, Elease Hashimoto 08/30/2021 12:40:34 PM Disp. Time Lamount Cohen Time) Disposition Final User 08/30/2021 12:35:53 PM Send to Urgent Queue Leotis Shames PLEASE NOTE: All timestamps contained within this report are represented as [place] Standard Time. CONFIDENTIALTY NOTICE: This fax transmission is intended only for the addressee. It contains information that is legally privileged, confidential or otherwise protected from use or disclosure. If you are not the intended recipient, you are strictly prohibited from reviewing, disclosing, copying using or disseminating any of this information or taking any action in reliance on or regarding this information. If you have received this fax in error, please notify us immediately by telephone so that we can arrange for its return to [place]. Phone: [phone], Toll-Free: [phone], Fax: [phone] Page: 2 of 2 Call Id: [id number] Disp. Time Lamount Cohen Time) Disposition Final User 08/30/2021 12:49:04 PM 911 Outcome Documentation Vear Clock RN, Elease Hashimoto Reason: refused 08/30/2021 12:44:17 PM Call EMS 911 Now Yes Vear Clock, RN, Ancil Boozer Disagree/Comply Disagree Caller Understands Yes PreDisposition Call Doctor Care Advice Given Per Guideline CALL EMS 911 NOW: CARE ADVICE given per Chest Pain (Adult)  guideline. Comments User: Aviva Kluver, RN Date/Time Lamount Cohen Time): 08/30/2021 12:43:57 PM she states she has  been having trouble with her words and it gotten worse over the last couple of days Referrals GO TO FACILITY REFUSED [facility] - ED

## 2021-09-02 ENCOUNTER — Other Ambulatory Visit: Payer: Self-pay | Admitting: Primary Care

## 2021-09-02 DIAGNOSIS — F419 Anxiety disorder, unspecified: Secondary | ICD-10-CM

## 2021-09-02 DIAGNOSIS — F32A Depression, unspecified: Secondary | ICD-10-CM

## 2021-09-21 LAB — OB RESULTS CONSOLE GC/CHLAMYDIA
Chlamydia: NEGATIVE
Gonorrhea: NEGATIVE

## 2021-09-21 LAB — OB RESULTS CONSOLE RPR: RPR: NONREACTIVE

## 2021-09-21 LAB — OB RESULTS CONSOLE ABO/RH

## 2021-09-21 LAB — HEPATITIS C ANTIBODY: HCV Ab: NEGATIVE

## 2021-09-21 LAB — OB RESULTS CONSOLE GBS: GBS: NEGATIVE

## 2021-09-21 LAB — OB RESULTS CONSOLE HEPATITIS B SURFACE ANTIGEN: Hepatitis B Surface Ag: NEGATIVE

## 2021-09-21 LAB — OB RESULTS CONSOLE HIV ANTIBODY (ROUTINE TESTING): HIV: NONREACTIVE

## 2021-09-21 LAB — OB RESULTS CONSOLE RUBELLA ANTIBODY, IGM: Rubella: NON-IMMUNE/NOT IMMUNE

## 2021-09-21 LAB — OB RESULTS CONSOLE ANTIBODY SCREEN: Antibody Screen: NEGATIVE

## 2021-09-27 ENCOUNTER — Encounter (HOSPITAL_COMMUNITY): Payer: Self-pay | Admitting: *Deleted

## 2021-09-27 ENCOUNTER — Other Ambulatory Visit: Payer: Self-pay

## 2021-09-27 ENCOUNTER — Inpatient Hospital Stay (HOSPITAL_COMMUNITY)
Admission: AD | Admit: 2021-09-27 | Discharge: 2021-09-27 | Disposition: A | Discharge status: Home / Self Care | Payer: 59 | Attending: Obstetrics | Admitting: Obstetrics

## 2021-09-27 DIAGNOSIS — E860 Dehydration: Secondary | ICD-10-CM | POA: Diagnosis not present

## 2021-09-27 DIAGNOSIS — O99281 Endocrine, nutritional and metabolic diseases complicating pregnancy, first trimester: Secondary | ICD-10-CM | POA: Diagnosis not present

## 2021-09-27 DIAGNOSIS — O211 Hyperemesis gravidarum with metabolic disturbance: Secondary | ICD-10-CM | POA: Diagnosis not present

## 2021-09-27 DIAGNOSIS — Z3A09 9 weeks gestation of pregnancy: Secondary | ICD-10-CM | POA: Diagnosis not present

## 2021-09-27 DIAGNOSIS — O210 Mild hyperemesis gravidarum: Secondary | ICD-10-CM | POA: Diagnosis not present

## 2021-09-27 LAB — URINALYSIS, ROUTINE W REFLEX MICROSCOPIC
Bilirubin Urine: NEGATIVE
Glucose, UA: NEGATIVE mg/dL
Hgb urine dipstick: NEGATIVE
Ketones, ur: 5 mg/dL — AB
Nitrite: NEGATIVE
Protein, ur: NEGATIVE mg/dL
Specific Gravity, Urine: 1.026 (ref 1.005–1.030)
pH: 7 (ref 5.0–8.0)

## 2021-09-27 LAB — BASIC METABOLIC PANEL
Anion gap: 11 (ref 5–15)
BUN: 10 mg/dL (ref 6–20)
CO2: 22 mmol/L (ref 22–32)
Calcium: 9.1 mg/dL (ref 8.9–10.3)
Chloride: 102 mmol/L (ref 98–111)
Creatinine, Ser: 0.63 mg/dL (ref 0.44–1.00)
GFR, Estimated: >60 mL/min (ref >60)
Glucose, Bld: 74 mg/dL (ref 70–99)
Potassium: 3.7 mmol/L (ref 3.5–5.1)
Sodium: 135 mmol/L (ref 135–145)

## 2021-09-27 LAB — CBC
HCT: 39.9 % (ref 36.0–46.0)
Hemoglobin: 13.6 g/dL (ref 12.0–15.0)
MCH: 30.3 pg (ref 26.0–34.0)
MCHC: 34.1 g/dL (ref 30.0–36.0)
MCV: 88.9 fL (ref 80.0–100.0)
Platelets: 331 10*3/uL (ref 150–400)
RBC: 4.49 MIL/uL (ref 3.87–5.11)
RDW: 11.4 % — ABNORMAL LOW (ref 11.5–15.5)
WBC: 7.7 10*3/uL (ref 4.0–10.5)
nRBC: 0 % (ref 0.0–0.2)

## 2021-09-27 MED ORDER — SCOPOLAMINE 1 MG/3DAYS TD PT72: 1.0000 | MEDICATED_PATCH | TRANSDERMAL | 0 refills | Status: DC

## 2021-09-27 MED ORDER — LACTATED RINGERS IV BOLUS
1000.0000 mL | Freq: Once | INTRAVENOUS | Status: AC
  Administered 2021-09-27: 1000 mL via INTRAVENOUS

## 2021-09-27 MED ORDER — ONDANSETRON HCL 4 MG PO TABS: 4.0000 mg | ORAL_TABLET | Freq: Three times a day (TID) | ORAL | 0 refills | Status: DC | PRN

## 2021-09-27 MED ORDER — SODIUM CHLORIDE 0.9 % IV SOLN
8.0000 mg | Freq: Once | INTRAVENOUS | Status: AC
  Administered 2021-09-27: 8 mg via INTRAVENOUS
  Filled 2021-09-27: qty 4

## 2021-09-27 MED ORDER — SCOPOLAMINE 1 MG/3DAYS TD PT72
1.0000 | MEDICATED_PATCH | TRANSDERMAL | Status: DC
  Administered 2021-09-27: 1.5 mg via TRANSDERMAL
  Filled 2021-09-27: qty 1

## 2021-09-27 MED ORDER — FAMOTIDINE IN NACL 20-0.9 MG/50ML-% IV SOLN
20.0000 mg | Freq: Once | INTRAVENOUS | Status: AC
  Administered 2021-09-27: 20 mg via INTRAVENOUS
  Filled 2021-09-27: qty 50

## 2021-09-27 NOTE — MAU Provider Note (Signed)
History     CSN: [id number]  Arrival date and time: 09/27/21 1625   Event Date/Time   First Provider Initiated Contact with Patient 09/27/21 1659      Chief Complaint  Patient presents with   Emesis   20 y.o. G1 @9 .2 wks presenting with worsening morning sickness. Reports onset of N/V about 2 weeks ago but became worse yesterday. She is unable to tolerate po food or fluids. Denies fevers. Denies diarrhea. She has taken Reglan and Phenergan but it didn't help.    OB History     Gravida  1   Para      Term      Preterm      AB      Living         SAB      IAB      Ectopic      Multiple      Live Births              Past Medical History:  Diagnosis Date   Allergy    Anxiety    Depression    Frequent headaches    Hypotension     Past Surgical History:  Procedure Laterality Date   KNEE SURGERY     TOOTH EXTRACTION      Family History  Problem Relation Age of Onset   Hyperlipidemia Mother    Anxiety disorder Mother    Depression Mother    Hypertension Father    Cancer Sister    Uterine cancer Maternal Grandmother    Lung cancer Paternal Grandfather    Migraines Other        Mfhx of migraine   Seizures Cousin    Autism Cousin     Social History   Tobacco Use   Smoking status: Never   Smokeless tobacco: Never  Substance Use Topics   Alcohol use: No   Drug use: No    Allergies: No Known Allergies  No medications prior to admission.    Review of Systems  Constitutional:  Negative for fever.  Gastrointestinal:  Positive for nausea and vomiting. Negative for abdominal pain and diarrhea.  Genitourinary:  Negative for vaginal bleeding.  Physical Exam   Blood pressure 108/67, pulse 90, temperature 98.4 F (36.9 C), temperature source Oral, resp. rate 16, height 5\' 5"  (1.651 m), weight 56.2 kg.  Physical Exam Vitals and nursing note reviewed.  Constitutional:      General: She is not in acute distress.    Appearance: Normal  appearance.  HENT:     Head: Normocephalic and atraumatic.  Cardiovascular:     Rate and Rhythm: Normal rate.  Pulmonary:     Effort: Pulmonary effort is normal. No respiratory distress.  Abdominal:     General: There is no distension.     Palpations: Abdomen is soft. There is no mass.     Tenderness: There is no abdominal tenderness. There is no guarding or rebound.     Hernia: No hernia is present.  Musculoskeletal:        General: Normal range of motion.     Cervical back: Normal range of motion.  Skin:    General: Skin is warm and dry.  Neurological:     General: No focal deficit present.     Mental Status: She is alert and oriented to person, place, and time.  Psychiatric:        Mood and Affect: Mood normal.  Behavior: Behavior normal.   Results for orders placed or performed during the hospital encounter of 09/27/21 (from the past 24 hour(s))  Urinalysis, Routine w reflex microscopic Urine, Clean Catch     Status: Abnormal   Collection Time: 09/27/21  5:18 PM  Result Value Ref Range   Color, Urine YELLOW YELLOW   APPearance CLOUDY (A) CLEAR   Specific Gravity, Urine 1.026 1.005 - 1.030   pH 7.0 5.0 - 8.0   Glucose, UA NEGATIVE NEGATIVE mg/dL   Hgb urine dipstick NEGATIVE NEGATIVE   Bilirubin Urine NEGATIVE NEGATIVE   Ketones, ur 5 (A) NEGATIVE mg/dL   Protein, ur NEGATIVE NEGATIVE mg/dL   Nitrite NEGATIVE NEGATIVE   Leukocytes,Ua LARGE (A) NEGATIVE   RBC / HPF 0-5 0 - 5 RBC/hpf   WBC, UA 21-50 0 - 5 WBC/hpf   Bacteria, UA FEW (A) NONE SEEN   Squamous Epithelial / LPF 21-50 0 - 5   Mucus PRESENT    Ca Oxalate Crys, UA PRESENT   CBC     Status: Abnormal   Collection Time: 09/27/21  5:27 PM  Result Value Ref Range   WBC 7.7 4.0 - 10.5 K/uL   RBC 4.49 3.87 - 5.11 MIL/uL   Hemoglobin 13.6 12.0 - 15.0 g/dL   HCT 97.6 73.4 - 19.3 %   MCV 88.9 80.0 - 100.0 fL   MCH 30.3 26.0 - 34.0 pg   MCHC 34.1 30.0 - 36.0 g/dL   RDW 79.0 (L) 24.0 - 97.3 %   Platelets  331 150 - 400 K/uL   nRBC 0.0 0.0 - 0.2 %  Basic metabolic panel     Status: None   Collection Time: 09/27/21  5:27 PM  Result Value Ref Range   Sodium 135 135 - 145 mmol/L   Potassium 3.7 3.5 - 5.1 mmol/L   Chloride 102 98 - 111 mmol/L   CO2 22 22 - 32 mmol/L   Glucose, Bld 74 70 - 99 mg/dL   BUN 10 6 - 20 mg/dL   Creatinine, Ser 5.32 0.44 - 1.00 mg/dL   Calcium 9.1 8.9 - 99.2 mg/dL   GFR, Estimated >42 >68 mL/min   Anion gap 11 5 - 15   MAU Course  Procedures LR Zofran Pepcid Scopolamine  MDM Labs ordered and reviewed.  Discussed the risk of using Zofran in early pregnancy: Available data suggest that use of Zofran (Ondansetron) in early pregnancy is not associated with a high risk of congenital malformations but a small absolute increase in risk of CV malformations (especially septum defects) and cleft palate may exist. The patient was warned of SE of constipation. Feeling better, tolerating po, no emesis. Stable for discharge home.  Assessment and Plan   1. [redacted] weeks gestation of pregnancy   2. Morning sickness   3. Dehydration    Discharge home Follow up at [facility] as scheduled Rx Zofran Rx Scopolamine Return precautions  Allergies as of 09/27/2021   No Known Allergies      Medication List     STOP taking these medications    aspirin-acetaminophen-caffeine 250-250-65 MG tablet Commonly known as: EXCEDRIN MIGRAINE   ibuprofen 800 MG tablet Commonly known as: ADVIL   lidocaine 2 % solution Commonly known as: XYLOCAINE   Lo Loestrin Fe 1 MG-10 MCG / 10 MCG tablet Generic drug: Norethindrone-Ethinyl Estradiol-Fe Biphas   metroNIDAZOLE 0.75 % vaginal gel Commonly known as: METROGEL   SUMAtriptan 50 MG tablet Commonly known as: IMITREX  topiramate 25 MG tablet Commonly known as: TOPAMAX       TAKE these medications    hydrOXYzine 25 MG tablet Commonly known as: ATARAX/VISTARIL Take 1 tablet (25 mg total) by mouth 2 (two) times daily  as needed for anxiety.   metoCLOPramide 10 MG tablet Commonly known as: REGLAN Take 10 mg by mouth 4 (four) times daily.   ondansetron 4 MG tablet Commonly known as: ZOFRAN Take 1 tablet (4 mg total) by mouth every 8 (eight) hours as needed for nausea or vomiting. What changed:  when to take this reasons to take this   promethazine 25 MG tablet Commonly known as: PHENERGAN Take 25 mg by mouth every 6 (six) hours as needed for nausea or vomiting.   scopolamine 1 MG/3DAYS Commonly known as: TRANSDERM-SCOP Place 1 patch (1.5 mg total) onto the skin every 3 (three) days. Start taking on: September 30, 2021   venlafaxine XR 150 MG 24 hr capsule Commonly known as: EFFEXOR-XR TAKE 1 CAPSULE(150 MG) BY MOUTH DAILY WITH BREAKFAST for anxiety.        Donette Larry, CNM 09/27/2021, 8:29 PM

## 2021-09-27 NOTE — MAU Note (Signed)
Pt reports she has not been able to keep anything down since yesterday. Has taken her meds but they are not helping.

## 2021-10-04 ENCOUNTER — Other Ambulatory Visit: Payer: Self-pay | Admitting: Primary Care

## 2021-10-04 DIAGNOSIS — G43009 Migraine without aura, not intractable, without status migrainosus: Secondary | ICD-10-CM

## 2021-11-23 ENCOUNTER — Other Ambulatory Visit: Payer: Self-pay

## 2021-11-23 ENCOUNTER — Inpatient Hospital Stay (HOSPITAL_COMMUNITY)
Admission: AD | Admit: 2021-11-23 | Discharge: 2021-11-23 | Disposition: A | Discharge status: Home / Self Care | Payer: 59 | Attending: Obstetrics and Gynecology | Admitting: Obstetrics and Gynecology

## 2021-11-23 DIAGNOSIS — R3915 Urgency of urination: Secondary | ICD-10-CM | POA: Insufficient documentation

## 2021-11-23 DIAGNOSIS — R300 Dysuria: Secondary | ICD-10-CM | POA: Diagnosis not present

## 2021-11-23 DIAGNOSIS — Z87440 Personal history of urinary (tract) infections: Secondary | ICD-10-CM | POA: Insufficient documentation

## 2021-11-23 DIAGNOSIS — O26892 Other specified pregnancy related conditions, second trimester: Secondary | ICD-10-CM | POA: Diagnosis present

## 2021-11-23 DIAGNOSIS — O99891 Other specified diseases and conditions complicating pregnancy: Secondary | ICD-10-CM | POA: Diagnosis not present

## 2021-11-23 DIAGNOSIS — R8271 Bacteriuria: Secondary | ICD-10-CM

## 2021-11-23 DIAGNOSIS — M545 Low back pain, unspecified: Secondary | ICD-10-CM | POA: Insufficient documentation

## 2021-11-23 DIAGNOSIS — Z3A17 17 weeks gestation of pregnancy: Secondary | ICD-10-CM

## 2021-11-23 DIAGNOSIS — R350 Frequency of micturition: Secondary | ICD-10-CM | POA: Diagnosis not present

## 2021-11-23 LAB — URINALYSIS, ROUTINE W REFLEX MICROSCOPIC
Bilirubin Urine: NEGATIVE
Glucose, UA: NEGATIVE mg/dL
Hgb urine dipstick: NEGATIVE
Ketones, ur: 5 mg/dL — AB
Nitrite: NEGATIVE
Protein, ur: 100 mg/dL — AB
Specific Gravity, Urine: 1.043 — ABNORMAL HIGH (ref 1.005–1.030)
Squamous Epithelial / HPF: >50 — ABNORMAL HIGH (ref 0–5)
WBC, UA: >50 WBC/hpf — ABNORMAL HIGH (ref 0–5)
pH: 5 (ref 5.0–8.0)

## 2021-11-23 MED ORDER — CEFADROXIL 500 MG PO CAPS: 500.0000 mg | ORAL_CAPSULE | Freq: Two times a day (BID) | ORAL | 0 refills | Status: DC

## 2021-11-23 NOTE — MAU Note (Signed)
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

## 2021-11-23 NOTE — MAU Provider Note (Signed)
History     CSN: [id number]  Arrival date and time: 11/23/21 8016   Event Date/Time   First Provider Initiated Contact with Patient 11/23/21 2106      Chief Complaint  Patient presents with   Urinary Frequency   Andrea Wilson is a 19 y.o. G1P0 at [redacted]w[redacted]d who receives care at [facility].  She presents today for Dysuria.  She states she started having urinary urgency and burning at 1700.  She states she has a history of UTI and most recent was in November.  Patient states she obtained some AZO, but did not take any of the pills.  Patient also reports back pain in her lower back that is constant, bilateral, and located in "my back dimples."  Patient rates the pain a 3/10. She endorses vaginal discharge, but states she was recently treated for BV. She denies constipation or diarrhea and recent sexual activity.     OB History     Gravida  1   Para      Term      Preterm      AB      Living         SAB      IAB      Ectopic      Multiple      Live Births              Past Medical History:  Diagnosis Date   Allergy    Anxiety    Depression    Frequent headaches    Hypotension     Past Surgical History:  Procedure Laterality Date   KNEE SURGERY     TOOTH EXTRACTION      Family History  Problem Relation Age of Onset   Hyperlipidemia Mother    Anxiety disorder Mother    Depression Mother    Hypertension Father    Cancer Sister    Uterine cancer Maternal Grandmother    Lung cancer Paternal Grandfather    Migraines Other        Mfhx of migraine   Seizures Cousin    Autism Cousin     Social History   Tobacco Use   Smoking status: Never   Smokeless tobacco: Never  Substance Use Topics   Alcohol use: No   Drug use: No    Allergies: No Known Allergies  Medications Prior to Admission  Medication Sig Dispense Refill Last Dose   hydrOXYzine (ATARAX/VISTARIL) 25 MG tablet Take 1 tablet (25 mg total) by mouth 2 (two) times daily as  needed for anxiety. 60 tablet 0    metoCLOPramide (REGLAN) 10 MG tablet Take 10 mg by mouth 4 (four) times daily.      ondansetron (ZOFRAN) 4 MG tablet Take 1 tablet (4 mg total) by mouth every 8 (eight) hours as needed for nausea or vomiting. 30 tablet 0    promethazine (PHENERGAN) 25 MG tablet Take 25 mg by mouth every 6 (six) hours as needed for nausea or vomiting.      scopolamine (TRANSDERM-SCOP) 1 MG/3DAYS Place 1 patch (1.5 mg total) onto the skin every 3 (three) days. 10 patch 0    venlafaxine XR (EFFEXOR-XR) 150 MG 24 hr capsule TAKE 1 CAPSULE(150 MG) BY MOUTH DAILY WITH BREAKFAST for anxiety. 90 capsule 3     Review of Systems  Gastrointestinal:  Negative for abdominal pain, constipation, diarrhea, nausea and vomiting.  Genitourinary:  Positive for dysuria and vaginal discharge. Negative for difficulty urinating,  flank pain and vaginal bleeding.  Musculoskeletal:  Positive for back pain.  Neurological:  Negative for headaches.  Physical Exam   Blood pressure 105/60, pulse (!) 129, temperature 97.9 F (36.6 C), temperature source Oral, resp. rate 20, height 5\' 5"  (1.651 m), weight 58.3 kg, SpO2 98 %.  Physical Exam Vitals reviewed.  Constitutional:      Appearance: Normal appearance.  HENT:     Head: Normocephalic and atraumatic.  Eyes:     Conjunctiva/sclera: Conjunctivae normal.  Cardiovascular:     Rate and Rhythm: Normal rate.  Pulmonary:     Effort: Pulmonary effort is normal. No respiratory distress.  Musculoskeletal:        General: Normal range of motion.     Cervical back: Normal range of motion.  Neurological:     Mental Status: She is alert and oriented to person, place, and time.  Psychiatric:        Mood and Affect: Mood normal.        Behavior: Behavior normal.        Thought Content: Thought content normal.    MAU Course  Procedures Results for orders placed or performed during the hospital encounter of 11/23/21 (from the past 24 hour(s))   Urinalysis, Routine w reflex microscopic Urine, Clean Catch     Status: Abnormal   Collection Time: 11/23/21  7:40 PM  Result Value Ref Range   Color, Urine AMBER (A) YELLOW   APPearance TURBID (A) CLEAR   Specific Gravity, Urine 1.043 (H) 1.005 - 1.030   pH 5.0 5.0 - 8.0   Glucose, UA NEGATIVE NEGATIVE mg/dL   Hgb urine dipstick NEGATIVE NEGATIVE   Bilirubin Urine NEGATIVE NEGATIVE   Ketones, ur 5 (A) NEGATIVE mg/dL   Protein, ur 11/25/21 (A) NEGATIVE mg/dL   Nitrite NEGATIVE NEGATIVE   Leukocytes,Ua MODERATE (A) NEGATIVE   RBC / HPF 11-20 0 - 5 RBC/hpf   WBC, UA >50 (H) 0 - 5 WBC/hpf   Bacteria, UA MANY (A) NONE SEEN   Squamous Epithelial / LPF >50 (H) 0 - 5   WBC Clumps PRESENT    Mucus PRESENT    Hyaline Casts, UA PRESENT    Ca Oxalate Crys, UA PRESENT     MDM Labs: UA, UC Antibiotics Assessment and Plan  20 year old, G1P0  SIUP at 17.4 weeks Bacteruria  Dysuria  -Results reviewed with patient. -Informed that additional testing necessary. -Discussed initiation of treatment and possible modification upon review of results. -Encouraged increased H2O intake. -Rx for Duricef sent to pharmacy of patient choice. -Encouraged to call primary office or return to MAU if symptoms worsen or with the onset of new symptoms. -Discharged to home in stable condition.  36 11/23/2021, 9:06 PM

## 2021-11-26 LAB — CULTURE, OB URINE: Culture: 40000 — AB

## 2021-11-28 ENCOUNTER — Other Ambulatory Visit: Payer: Self-pay | Admitting: Family Medicine

## 2021-11-28 NOTE — Progress Notes (Signed)
Patient with E.coli on urine cx. Susceptible to CTX. Was already discharged with [facility].  Warner Mccreedy, MD, MPH OB Fellow, Faculty Practice

## 2021-12-05 NOTE — L&D Delivery Note (Signed)
Delivery Note ?Pt complete with urge to push. She pushed for 8 mins and at 12:08 PM a viable female was delivered via Vaginal, Spontaneous (Presentation: Right Occiput Anterior).  APGAR: 9, 9; weight pending . ?Anterior and posterior shoulders delivered with no difficulty; body followed easily.  ?Infant placed on maternal abdomen. Cord clamped and cut after a minute delay ?Cord blood obtained.  ?Placenta noted to be adherent. Required manual extractio. Uterus boggy after placenta removed.  ?Pitocin allowed to run wide open. Copious vaginal bleeding noted hence a dose of hemabate and TXA both given.  ?Vaginal exploration noted a right cervical laceration. With help of Dr Donavan Foil, great visualization of laceration was noted in room hence repair performed in room with 2-0 vicryl. Hemostasis noted.  ?A JADA device was then placed after uerine sweep confirmed no retained products.  ?A second bore IV was placed concurrently while above ongoing. CBC and coags obtained and 2u prbcs ordered to run.  ?Pt BP noted to improve from 70/40s to 120-130s/80s. Pt had a brief spell of severe lethargy initially - improved once interventions started.  ?   ?Placenta status: Pathology. Adherent;Intact.  Cord: 3 vessels with the following complications: None.  Cord pH: n/a ? ?Anesthesia: Epidural ?Episiotomy: None ?Lacerations: 1st degree;Vaginal;Cervical ?Suture Repair: 2.0 vicryl ?Est. Blood Loss (mL): 1800 ? ?Mom to postpartum.  Baby to Couplet care / Skin to Skin ? ?Explained to pt and family the course of events that occurred post delivery and answered questions mostly regarding expectations in future pregnancies.  ?Pt agreed with plan for blood transfusion ?Will check cbc later today and in am ? ?Reassess bleeding after 1-2hrs and remove JADA as indicated  ?Continue on MgSO4 x 24hrs. ? ?Cathrine Muster ?03/26/2022, 1:09 PM ? ? ? ?

## 2022-02-11 ENCOUNTER — Other Ambulatory Visit: Payer: Self-pay | Admitting: Primary Care

## 2022-02-11 DIAGNOSIS — F419 Anxiety disorder, unspecified: Secondary | ICD-10-CM

## 2022-02-11 DIAGNOSIS — F32A Depression, unspecified: Secondary | ICD-10-CM

## 2022-02-11 NOTE — Telephone Encounter (Signed)
Received refill request for venlafaxine for which she takes for anxiety. ? ?She is currently pregnant, if she still taking venlafaxine?  Or was this an auto refill from her pharmacy? ?

## 2022-02-14 ENCOUNTER — Telehealth: Payer: Self-pay | Admitting: Primary Care

## 2022-02-14 NOTE — Telephone Encounter (Signed)
Spoke to patient states that this the OB has cleared her to take this with pregnancy  ?

## 2022-02-14 NOTE — Telephone Encounter (Signed)
Refills sent to pharmacy. 

## 2022-02-14 NOTE — Telephone Encounter (Signed)
?  Encourage patient to contact the pharmacy for refills or they can request refills through [facility] ? ?LAST APPOINTMENT DATE:  Please schedule appointment if longer than 1 year ? ?NEXT APPOINTMENT DATE: ? ?MEDICATION:venlafaxine XR (EFFEXOR-XR) 150 MG 24 hr capsule ? ?Is the patient out of medication?  ? ?PHARMACY:[id number] - [place], [place] - [address] ? ?Let patient know to contact pharmacy at the end of the day to make sure medication is ready. ? ?Please notify patient to allow 48-72 hours to process ? ?CLINICAL FILLS OUT ALL BELOW:  ? ?LAST REFILL: ? ?QTY: ? ?REFILL DATE: ? ? ? ?OTHER COMMENTS:  ? ? ?Okay for refill? ? ?Please advise ? ? ?  ?

## 2022-02-16 ENCOUNTER — Other Ambulatory Visit: Payer: Self-pay

## 2022-02-16 ENCOUNTER — Inpatient Hospital Stay (HOSPITAL_COMMUNITY)
Admission: AD | Admit: 2022-02-16 | Discharge: 2022-02-16 | Disposition: A | Discharge status: Home / Self Care | Payer: 59 | Attending: Obstetrics and Gynecology | Admitting: Obstetrics and Gynecology

## 2022-02-16 DIAGNOSIS — O219 Vomiting of pregnancy, unspecified: Secondary | ICD-10-CM | POA: Diagnosis not present

## 2022-02-16 DIAGNOSIS — R109 Unspecified abdominal pain: Secondary | ICD-10-CM | POA: Insufficient documentation

## 2022-02-16 DIAGNOSIS — Z3A29 29 weeks gestation of pregnancy: Secondary | ICD-10-CM | POA: Diagnosis not present

## 2022-02-16 DIAGNOSIS — O26893 Other specified pregnancy related conditions, third trimester: Secondary | ICD-10-CM | POA: Insufficient documentation

## 2022-02-16 DIAGNOSIS — O212 Late vomiting of pregnancy: Secondary | ICD-10-CM | POA: Diagnosis not present

## 2022-02-16 DIAGNOSIS — Z3689 Encounter for other specified antenatal screening: Secondary | ICD-10-CM | POA: Diagnosis not present

## 2022-02-16 LAB — URINALYSIS, ROUTINE W REFLEX MICROSCOPIC
Bilirubin Urine: NEGATIVE
Glucose, UA: NEGATIVE mg/dL
Hgb urine dipstick: NEGATIVE
Ketones, ur: 80 mg/dL — AB
Nitrite: NEGATIVE
Protein, ur: 100 mg/dL — AB
Specific Gravity, Urine: 1.026 (ref 1.005–1.030)
pH: 5 (ref 5.0–8.0)

## 2022-02-16 LAB — WET PREP, GENITAL
Clue Cells Wet Prep HPF POC: NONE SEEN
Sperm: NONE SEEN
Trich, Wet Prep: NONE SEEN
WBC, Wet Prep HPF POC: >=10 — AB (ref <10)
Yeast Wet Prep HPF POC: NONE SEEN

## 2022-02-16 MED ORDER — LACTATED RINGERS IV BOLUS
1000.0000 mL | Freq: Once | INTRAVENOUS | Status: AC
  Administered 2022-02-16: 1000 mL via INTRAVENOUS

## 2022-02-16 NOTE — Discharge Instructions (Signed)

## 2022-02-16 NOTE — MAU Provider Note (Signed)
?History  ?  ? ?CSN: [id number] ? ?Arrival date and time: 02/16/22 1837 ? ? Event Date/Time  ? First Provider Initiated Contact with Patient 02/16/22 1933   ?  ? ?Chief Complaint  ?Patient presents with  ? Nausea  ? Decreased Fetal Movement  ? Abdominal Pain  ? ?HPI ?Andrea Wilson is a 21 y.o. G1P0 at [redacted]w[redacted]d who presents feeling generally unwell. She states at 0400 she had abdominal pain all over her abdomen. She reports the pain resolved after 30 minutes. She now reports feeling nauseous and unable to keep anything down all day. She reports since her arrival to MAU, she is no longer feeling nauseous. She reports decreased fetal movement today but since her arrival to MAU, the baby is moving normally. She reports an increase in discharge. She denies any bleeding.  ? ?OB History   ? ? Gravida  ?1  ? Para  ?   ? Term  ?   ? Preterm  ?   ? AB  ?   ? Living  ?   ?  ? ? SAB  ?   ? IAB  ?   ? Ectopic  ?   ? Multiple  ?   ? Live Births  ?   ?   ?  ?  ? ? ?Past Medical History:  ?Diagnosis Date  ? Allergy   ? Anxiety   ? Depression   ? Frequent headaches   ? Hypotension   ? ? ?Past Surgical History:  ?Procedure Laterality Date  ? KNEE SURGERY    ? TOOTH EXTRACTION    ? ? ?Family History  ?Problem Relation Age of Onset  ? Hyperlipidemia Mother   ? Anxiety disorder Mother   ? Depression Mother   ? Hypertension Father   ? Cancer Sister   ? Uterine cancer Maternal Grandmother   ? Lung cancer Paternal Grandfather   ? Migraines Other   ?     Mfhx of migraine  ? Seizures Cousin   ? Autism Cousin   ? ? ?Social History  ? ?Tobacco Use  ? Smoking status: Never  ? Smokeless tobacco: Never  ?Substance Use Topics  ? Alcohol use: No  ? Drug use: No  ? ? ?Allergies: No Known Allergies ? ?Medications Prior to Admission  ?Medication Sig Dispense Refill Last Dose  ? venlafaxine XR (EFFEXOR-XR) 150 MG 24 hr capsule TAKE 1 CAPSULE(150 MG) BY MOUTH DAILY WITH BREAKFAST FOR ANXIETY 90 capsule 1 02/16/2022  ? cefadroxil (DURICEF) 500 MG capsule  Take 1 capsule (500 mg total) by mouth 2 (two) times daily. 14 capsule 0   ? hydrOXYzine (ATARAX/VISTARIL) 25 MG tablet Take 1 tablet (25 mg total) by mouth 2 (two) times daily as needed for anxiety. 60 tablet 0   ? metoCLOPramide (REGLAN) 10 MG tablet Take 10 mg by mouth 4 (four) times daily.     ? ondansetron (ZOFRAN) 4 MG tablet Take 1 tablet (4 mg total) by mouth every 8 (eight) hours as needed for nausea or vomiting. 30 tablet 0   ? promethazine (PHENERGAN) 25 MG tablet Take 25 mg by mouth every 6 (six) hours as needed for nausea or vomiting.     ? scopolamine (TRANSDERM-SCOP) 1 MG/3DAYS Place 1 patch (1.5 mg total) onto the skin every 3 (three) days. 10 patch 0   ? ? ?Review of Systems  ?Constitutional: Negative.  Negative for fatigue and fever.  ?HENT: Negative.    ?Respiratory: Negative.  Negative  for shortness of breath.   ?Cardiovascular: Negative.  Negative for chest pain.  ?Gastrointestinal:  Positive for abdominal pain. Negative for constipation, diarrhea, nausea and vomiting.  ?Genitourinary:  Positive for vaginal discharge. Negative for dysuria and vaginal bleeding.  ?Neurological: Negative.  Negative for dizziness and headaches.  ?Physical Exam  ? ?Blood pressure 109/72, pulse (!) 132, temperature 98.3 ?F (36.8 ?C), temperature source Oral, resp. rate 15, SpO2 97 %. ? ?Physical Exam ?Vitals and nursing note reviewed.  ?Constitutional:   ?   General: She is not in acute distress. ?   Appearance: She is well-developed.  ?HENT:  ?   Head: Normocephalic.  ?Eyes:  ?   Pupils: Pupils are equal, round, and reactive to light.  ?Cardiovascular:  ?   Rate and Rhythm: Normal rate and regular rhythm.  ?   Heart sounds: Normal heart sounds.  ?Pulmonary:  ?   Effort: Pulmonary effort is normal. No respiratory distress.  ?   Breath sounds: Normal breath sounds.  ?Abdominal:  ?   General: Bowel sounds are normal. There is no distension.  ?   Palpations: Abdomen is soft.  ?   Tenderness: There is no abdominal  tenderness.  ?Genitourinary: ?   Comments: Pelvic exam: Cervix pink, visually closed, without lesion, scant white creamy discharge, vaginal walls and external genitalia normal ?Bimanual exam: Cervix 0/long/high, firm ? ? ?Skin: ?   General: Skin is warm and dry.  ?Neurological:  ?   Mental Status: She is alert and oriented to person, place, and time.  ?Psychiatric:     ?   Mood and Affect: Mood normal.     ?   Behavior: Behavior normal.     ?   Thought Content: Thought content normal.     ?   Judgment: Judgment normal.  ? ?Fetal Tracing: ? ?Baseline: 150 ?Variability: moderate ?Accels: 15x15 ?Decels: none ? ?Toco: UI ? ?MAU Course  ?Procedures ?Results for orders placed or performed during the hospital encounter of 02/16/22 (from the past 24 hour(s))  ?Urinalysis, Routine w reflex microscopic     Status: Abnormal  ? Collection Time: 02/16/22  6:37 PM  ?Result Value Ref Range  ? Color, Urine AMBER (A) YELLOW  ? APPearance CLOUDY (A) CLEAR  ? Specific Gravity, Urine 1.026 1.005 - 1.030  ? pH 5.0 5.0 - 8.0  ? Glucose, UA NEGATIVE NEGATIVE mg/dL  ? Hgb urine dipstick NEGATIVE NEGATIVE  ? Bilirubin Urine NEGATIVE NEGATIVE  ? Ketones, ur 80 (A) NEGATIVE mg/dL  ? Protein, ur 100 (A) NEGATIVE mg/dL  ? Nitrite NEGATIVE NEGATIVE  ? Leukocytes,Ua MODERATE (A) NEGATIVE  ? RBC / HPF 0-5 0 - 5 RBC/hpf  ? WBC, UA 21-50 0 - 5 WBC/hpf  ? Bacteria, UA MANY (A) NONE SEEN  ? Squamous Epithelial / LPF 21-50 0 - 5  ? Mucus PRESENT   ? Non Squamous Epithelial 0-5 (A) NONE SEEN  ?Wet prep, genital     Status: Abnormal  ? Collection Time: 02/16/22  7:50 PM  ?Result Value Ref Range  ? Yeast Wet Prep HPF POC NONE SEEN NONE SEEN  ? Trich, Wet Prep NONE SEEN NONE SEEN  ? Clue Cells Wet Prep HPF POC NONE SEEN NONE SEEN  ? WBC, Wet Prep HPF POC >=10 (A) <10  ? Sperm NONE SEEN   ?  ?MDM ?Prenatal records from private office reviewed. Pregnancy uncomplicated, similar complaints at last prenatal visit. ?Labs ordered and reviewed. ? ?UA, UC ?LR  bolus ?  Wet prep and gc/chlamydia ?Patient declines nausea medication in MAU ? ?Assessment and Plan  ? ?1. NST (non-stress test) reactive   ?2. [redacted] weeks gestation of pregnancy   ?3. Nausea and vomiting during pregnancy   ? ?-Discharge home in stable condition ?-Third trimester precautions discussed ?-Patient advised to follow-up with OB as scheduled for prenatal care ?-Patient may return to MAU as needed or if her condition were to change or worsen ? ? ?Rolm Bookbinder CNM ?02/16/2022, 7:33 PM  ?

## 2022-02-16 NOTE — MAU Note (Addendum)
Andrea Wilson is a 21 y.o. at [redacted]w[redacted]d here in MAU reporting: generalized abdominal pain that started around 0400 this morning. She also reports DFM since then. Denies VB. Does report an increase in yellow watery discharge for "a while." Currently having nausea.  ? ?Pain score: 3 ? ?Lab orders placed from triage:  UA ?

## 2022-02-17 LAB — GC/CHLAMYDIA PROBE AMP (~~LOC~~) NOT AT ARMC
Chlamydia: NEGATIVE
Comment: NEGATIVE
Comment: NORMAL
Neisseria Gonorrhea: NEGATIVE

## 2022-02-18 LAB — CULTURE, OB URINE: Culture: <10000 — AB

## 2022-03-20 ENCOUNTER — Inpatient Hospital Stay (HOSPITAL_COMMUNITY)
Admission: AD | Admit: 2022-03-20 | Discharge: 2022-03-20 | Disposition: A | Discharge status: Home / Self Care | Payer: 59 | Attending: Obstetrics and Gynecology | Admitting: Obstetrics and Gynecology

## 2022-03-20 ENCOUNTER — Encounter (HOSPITAL_COMMUNITY): Payer: Self-pay | Admitting: Obstetrics and Gynecology

## 2022-03-20 DIAGNOSIS — Z79899 Other long term (current) drug therapy: Secondary | ICD-10-CM | POA: Diagnosis not present

## 2022-03-20 DIAGNOSIS — O4703 False labor before 37 completed weeks of gestation, third trimester: Secondary | ICD-10-CM | POA: Insufficient documentation

## 2022-03-20 DIAGNOSIS — M549 Dorsalgia, unspecified: Secondary | ICD-10-CM | POA: Diagnosis not present

## 2022-03-20 DIAGNOSIS — Z3A34 34 weeks gestation of pregnancy: Secondary | ICD-10-CM | POA: Diagnosis not present

## 2022-03-20 DIAGNOSIS — O4700 False labor before 37 completed weeks of gestation, unspecified trimester: Secondary | ICD-10-CM | POA: Diagnosis not present

## 2022-03-20 DIAGNOSIS — O26893 Other specified pregnancy related conditions, third trimester: Secondary | ICD-10-CM | POA: Insufficient documentation

## 2022-03-20 DIAGNOSIS — O99891 Other specified diseases and conditions complicating pregnancy: Secondary | ICD-10-CM

## 2022-03-20 LAB — URINALYSIS, ROUTINE W REFLEX MICROSCOPIC
Glucose, UA: NEGATIVE mg/dL
Hgb urine dipstick: NEGATIVE
Ketones, ur: NEGATIVE mg/dL
Nitrite: NEGATIVE
Protein, ur: 100 mg/dL — AB
Specific Gravity, Urine: 1.031 — ABNORMAL HIGH (ref 1.005–1.030)
pH: 5 (ref 5.0–8.0)

## 2022-03-20 LAB — FETAL FIBRONECTIN: Fetal Fibronectin: POSITIVE — AB

## 2022-03-20 MED ORDER — LACTATED RINGERS IV BOLUS
1000.0000 mL | Freq: Once | INTRAVENOUS | Status: AC
  Administered 2022-03-20: 1000 mL via INTRAVENOUS

## 2022-03-20 MED ORDER — NIFEDIPINE 10 MG PO CAPS
10.0000 mg | ORAL_CAPSULE | ORAL | Status: AC
  Administered 2022-03-20 (×2): 10 mg via ORAL
  Filled 2022-03-20 (×2): qty 1

## 2022-03-20 MED ORDER — CYCLOBENZAPRINE HCL 10 MG PO TABS: 10.0000 mg | ORAL_TABLET | Freq: Two times a day (BID) | ORAL | 0 refills | Status: DC | PRN

## 2022-03-20 MED ORDER — CYCLOBENZAPRINE HCL 5 MG PO TABS
10.0000 mg | ORAL_TABLET | Freq: Once | ORAL | Status: AC
  Administered 2022-03-20: 10 mg via ORAL
  Filled 2022-03-20: qty 2

## 2022-03-20 MED ORDER — NIFEDIPINE 10 MG PO CAPS
ORAL_CAPSULE | ORAL | Status: AC
  Administered 2022-03-20: 10 mg via ORAL
  Filled 2022-03-20: qty 1

## 2022-03-20 NOTE — MAU Provider Note (Signed)
?History  ?  ? ?CSN: [id number] ? ?Arrival date and time: 03/20/22 0106 ? ? Event Date/Time  ? First Provider Initiated Contact with Patient 03/20/22 [phone]   ?  ? ?Chief Complaint  ?Patient presents with  ? Contractions  ? ?Andrea Wilson is a 21 y.o. year old G1P0 female at [redacted]w[redacted]d weeks gestation who presents to MAU reporting contractions this morning and increased around midnight. She has not timed the contractions, but states "I have at least 1 every 30 minutes." She checked her cervix and was able to get 1 finger in her cervix. She states that she also verified this using the purple line method. She denies VB or LOF. She reports good (+) FM. She receives [facility] with [facility]; next appt is next week. Her FOB is present and contributing to the history taking. ? ? ?OB History   ? ? Gravida  ?1  ? Para  ?   ? Term  ?   ? Preterm  ?   ? AB  ?   ? Living  ?   ?  ? ? SAB  ?   ? IAB  ?   ? Ectopic  ?   ? Multiple  ?   ? Live Births  ?   ?   ?  ?  ? ? ?Past Medical History:  ?Diagnosis Date  ? Allergy   ? Anxiety   ? Depression   ? Frequent headaches   ? Hypotension   ? ? ?Past Surgical History:  ?Procedure Laterality Date  ? KNEE SURGERY    ? TOOTH EXTRACTION    ? ? ?Family History  ?Problem Relation Age of Onset  ? Hyperlipidemia Mother   ? Anxiety disorder Mother   ? Depression Mother   ? Hypertension Father   ? Cancer Sister   ? Uterine cancer Maternal Grandmother   ? Lung cancer Paternal Grandfather   ? Migraines Other   ?     Mfhx of migraine  ? Seizures Cousin   ? Autism Cousin   ? ? ?Social History  ? ?Tobacco Use  ? Smoking status: Never  ? Smokeless tobacco: Never  ?Substance Use Topics  ? Alcohol use: No  ? Drug use: No  ? ? ?Allergies: No Known Allergies ? ?Medications Prior to Admission  ?Medication Sig Dispense Refill Last Dose  ? venlafaxine XR (EFFEXOR-XR) 150 MG 24 hr capsule TAKE 1 CAPSULE(150 MG) BY MOUTH DAILY WITH BREAKFAST FOR ANXIETY 90 capsule 1 03/19/2022  ? hydrOXYzine (ATARAX/VISTARIL)  25 MG tablet Take 1 tablet (25 mg total) by mouth 2 (two) times daily as needed for anxiety. 60 tablet 0   ? metoCLOPramide (REGLAN) 10 MG tablet Take 10 mg by mouth 4 (four) times daily.     ? ondansetron (ZOFRAN) 4 MG tablet Take 1 tablet (4 mg total) by mouth every 8 (eight) hours as needed for nausea or vomiting. 30 tablet 0   ? promethazine (PHENERGAN) 25 MG tablet Take 25 mg by mouth every 6 (six) hours as needed for nausea or vomiting.     ? scopolamine (TRANSDERM-SCOP) 1 MG/3DAYS Place 1 patch (1.5 mg total) onto the skin every 3 (three) days. 10 patch 0   ? ? ?Review of Systems  ?Constitutional: Negative.   ?HENT: Negative.    ?Eyes: Negative.   ?Respiratory: Negative.    ?Cardiovascular: Negative.   ?Gastrointestinal: Negative.   ?Endocrine: Negative.   ?Genitourinary:  Positive for pelvic pain.  ?Musculoskeletal:  Positive for back pain.  ?Skin: Negative.   ?Allergic/Immunologic: Negative.   ?Neurological: Negative.   ?Hematological: Negative.   ?Psychiatric/Behavioral: Negative.    ?Physical Exam  ? ?Blood pressure 127/87, pulse 68, temperature 98.6 ?F (37 ?C), temperature source Oral, resp. rate 16, height 5\' 5"  (1.651 m), weight 70.3 kg. ? ?Physical Exam ?Vitals and nursing note reviewed. Exam conducted with a chaperone present.  ?Constitutional:   ?   Appearance: Normal appearance. She is normal weight.  ?Cardiovascular:  ?   Rate and Rhythm: Normal rate.  ?Pulmonary:  ?   Effort: Pulmonary effort is normal.  ?Abdominal:  ?   Palpations: Abdomen is soft.  ?Genitourinary: ?   General: Normal vulva.  ?   Comments: fFN obtained  ? ?Dilation: Closed (Ext Os = 1 cm) ?Effacement (%): Thick ?Station: Ballotable ?Presentation: Undeterminable ?Exam by:: [email or web address] CNM  ?Musculoskeletal:     ?   General: Normal range of motion.  ?Skin: ?   General: Skin is warm and dry.  ?Neurological:  ?   Mental Status: She is alert and oriented to person, place, and time.  ?Psychiatric:     ?   Mood and Affect: Mood normal.      ?   Behavior: Behavior normal.     ?   Thought Content: Thought content normal.     ?   Judgment: Judgment normal.  ? ?REACTIVE NST - FHR: 130 bpm / moderate variability / accels present / decels absent / TOCO: irregular UC's ?MAU Course  ?Procedures ? ?MDM ?CCUA ?fFN ?LR bolus 1000 ml @ 999 mL/hr ?Flexeril 10 mg po -- relieved back pain ?Procardia 10 mg every 20 mins x 3 doses -- contractions stopped ? ?Results for orders placed or performed during the hospital encounter of 03/20/22 (from the past 24 hour(s))  ?Fetal fibronectin     Status: Abnormal  ? Collection Time: 03/20/22  2:30 AM  ?Result Value Ref Range  ? Fetal Fibronectin POSITIVE (A) NEGATIVE  ?Urinalysis, Routine w reflex microscopic     Status: Abnormal  ? Collection Time: 03/20/22  2:40 AM  ?Result Value Ref Range  ? Color, Urine AMBER (A) YELLOW  ? APPearance HAZY (A) CLEAR  ? Specific Gravity, Urine 1.031 (H) 1.005 - 1.030  ? pH 5.0 5.0 - 8.0  ? Glucose, UA NEGATIVE NEGATIVE mg/dL  ? Hgb urine dipstick NEGATIVE NEGATIVE  ? Bilirubin Urine SMALL (A) NEGATIVE  ? Ketones, ur NEGATIVE NEGATIVE mg/dL  ? Protein, ur 100 (A) NEGATIVE mg/dL  ? Nitrite NEGATIVE NEGATIVE  ? Leukocytes,Ua TRACE (A) NEGATIVE  ? RBC / HPF 0-5 0 - 5 RBC/hpf  ? WBC, UA 11-20 0 - 5 WBC/hpf  ? Bacteria, UA RARE (A) NONE SEEN  ? Squamous Epithelial / LPF 6-10 0 - 5  ? Mucus PRESENT   ? ? ?Assessment and Plan  ?Preterm uterine contractions  ?- Information provided on PTL and preventing PTB ?- Advised to return to MAU for more than 6 painful contractions in 1 hour ?  ?Back pain affecting pregnancy in third trimester  ?- Information provided on back pain pregnancy ?- Rx for Flexeril 10 mg BID prn back pain ?  ?[redacted] weeks gestation of pregnancy ? ?- Discharge patient ?- Call to schedule f/u appt with [facility] by Thursday of this week ?- Patient verbalized an understanding of the plan of care and agrees.  ? ? ?Monday, CNM ?03/20/2022, 2:18 AM  ?

## 2022-03-20 NOTE — MAU Note (Signed)
.  Andrea Wilson is a 21 y.o. at [redacted]w[redacted]d here in MAU reporting: contractions that began this morning but then by 0000 the contractions intensified. Pt checked her cervix and was able to get 1 finger into her cervix. Denies SROM or vaginal bleeding. Endorses + fetal movement. ? ?Pain score: 5 ?Vitals:  ? 03/20/22 0142  ?BP: 128/83  ?Pulse: 80  ?Resp: 16  ?Temp: 98.6 ?F (37 ?C)  ?   ?FHT:142bpm ? ?

## 2022-03-20 NOTE — Discharge Instructions (Signed)
Return to MAU for more than 6 PAINFUL contractions in 1 hour. Please do not attempt to check your own cervix or have anyone beside a trained professional check your cervix. ?

## 2022-03-22 ENCOUNTER — Inpatient Hospital Stay (EMERGENCY_DEPARTMENT_HOSPITAL)
Admission: AD | Admit: 2022-03-22 | Discharge: 2022-03-22 | Disposition: A | Discharge status: Home / Self Care | Payer: 59 | Source: Home / Self Care | Attending: Obstetrics and Gynecology | Admitting: Obstetrics and Gynecology

## 2022-03-22 ENCOUNTER — Other Ambulatory Visit: Payer: Self-pay

## 2022-03-22 ENCOUNTER — Encounter (HOSPITAL_COMMUNITY): Payer: Self-pay | Admitting: Obstetrics and Gynecology

## 2022-03-22 DIAGNOSIS — O1403 Mild to moderate pre-eclampsia, third trimester: Secondary | ICD-10-CM | POA: Insufficient documentation

## 2022-03-22 DIAGNOSIS — Z3A34 34 weeks gestation of pregnancy: Secondary | ICD-10-CM | POA: Insufficient documentation

## 2022-03-22 DIAGNOSIS — O1414 Severe pre-eclampsia complicating childbirth: Secondary | ICD-10-CM | POA: Diagnosis not present

## 2022-03-22 DIAGNOSIS — O1493 Unspecified pre-eclampsia, third trimester: Secondary | ICD-10-CM | POA: Diagnosis not present

## 2022-03-22 DIAGNOSIS — O4703 False labor before 37 completed weeks of gestation, third trimester: Secondary | ICD-10-CM | POA: Diagnosis not present

## 2022-03-22 DIAGNOSIS — O1413 Severe pre-eclampsia, third trimester: Secondary | ICD-10-CM | POA: Diagnosis not present

## 2022-03-22 LAB — COMPREHENSIVE METABOLIC PANEL
ALT: 11 U/L (ref 0–44)
AST: 18 U/L (ref 15–41)
Albumin: 2.6 g/dL — ABNORMAL LOW (ref 3.5–5.0)
Alkaline Phosphatase: 219 U/L — ABNORMAL HIGH (ref 38–126)
Anion gap: 8 (ref 5–15)
BUN: <5 mg/dL — ABNORMAL LOW (ref 6–20)
CO2: 23 mmol/L (ref 22–32)
Calcium: 8.2 mg/dL — ABNORMAL LOW (ref 8.9–10.3)
Chloride: 108 mmol/L (ref 98–111)
Creatinine, Ser: 0.68 mg/dL (ref 0.44–1.00)
GFR, Estimated: >60 mL/min (ref >60)
Glucose, Bld: 132 mg/dL — ABNORMAL HIGH (ref 70–99)
Potassium: 3.8 mmol/L (ref 3.5–5.1)
Sodium: 139 mmol/L (ref 135–145)
Total Bilirubin: 0.3 mg/dL (ref 0.3–1.2)
Total Protein: 5.2 g/dL — ABNORMAL LOW (ref 6.5–8.1)

## 2022-03-22 LAB — CBC
HCT: 27.9 % — ABNORMAL LOW (ref 36.0–46.0)
Hemoglobin: 9.2 g/dL — ABNORMAL LOW (ref 12.0–15.0)
MCH: 29.3 pg (ref 26.0–34.0)
MCHC: 33 g/dL (ref 30.0–36.0)
MCV: 88.9 fL (ref 80.0–100.0)
Platelets: 157 10*3/uL (ref 150–400)
RBC: 3.14 MIL/uL — ABNORMAL LOW (ref 3.87–5.11)
RDW: 12.2 % (ref 11.5–15.5)
WBC: 7.3 10*3/uL (ref 4.0–10.5)
nRBC: 0 % (ref 0.0–0.2)

## 2022-03-22 LAB — PROTEIN / CREATININE RATIO, URINE
Creatinine, Urine: 358.28 mg/dL
Protein Creatinine Ratio: 0.3 mg/mg{Cre} — ABNORMAL HIGH (ref 0.00–0.15)
Total Protein, Urine: 109 mg/dL

## 2022-03-22 LAB — URINALYSIS, ROUTINE W REFLEX MICROSCOPIC
Bilirubin Urine: NEGATIVE
Glucose, UA: NEGATIVE mg/dL
Hgb urine dipstick: NEGATIVE
Ketones, ur: NEGATIVE mg/dL
Nitrite: NEGATIVE
Protein, ur: 100 mg/dL — AB
Specific Gravity, Urine: 1.023 (ref 1.005–1.030)
pH: 5 (ref 5.0–8.0)

## 2022-03-22 MED ORDER — ACETAMINOPHEN 500 MG PO TABS
1000.0000 mg | ORAL_TABLET | Freq: Once | ORAL | Status: DC
  Filled 2022-03-22: qty 2

## 2022-03-22 MED ORDER — BETAMETHASONE SOD PHOS & ACET 6 (3-3) MG/ML IJ SUSP
12.0000 mg | Freq: Once | INTRAMUSCULAR | Status: AC
  Administered 2022-03-22: 12 mg via INTRAMUSCULAR
  Filled 2022-03-22: qty 5

## 2022-03-22 NOTE — MAU Provider Note (Signed)
Chief Complaint  ?Patient presents with  ? Hypertension  ? ? ? Event Date/Time  ? First Provider Initiated Contact with Patient 03/22/22 1403   ?  ? ?S: Andrea Wilson  is a 21 y.o. y.o. year old G1P0 female at [redacted]w[redacted]d weeks gestation who presents to MAU with elevated blood pressures. HTN first noted at the office today at 12:00. Current blood pressure medication: Low-dose Procardia 10 mg Q6 PRN for Preterm contractions. Last dose 0630 03/22/22. Also C/O contractions. Was checked in the office and was 1 cm.  ? ?Associated symptoms: 2/10 Headache, denies vision changes, reports epigastric pain ?Contractions: Q 3-8, mild ?Vaginal bleeding: Denies ?Fetal movement: Nml ? ?O: Patient Vitals for the past 24 hrs: ? BP Temp Temp src Pulse Resp SpO2 Height Weight  ?03/22/22 1703 -- -- -- -- -- 98 % -- --  ?03/22/22 1701 (!) 141/98 98.3 ?F (36.8 ?C) Oral 76 20 98 % -- --  ?03/22/22 1630 (!) 130/98 -- -- 87 -- 98 % -- --  ?03/22/22 1615 (!) 130/97 -- -- 88 -- 99 % -- --  ?03/22/22 1600 (!) 142/101 -- -- 96 -- 99 % -- --  ?03/22/22 1530 (!) 132/97 -- -- (!) 101 -- 99 % -- --  ?03/22/22 1500 (!) 135/94 -- -- (!) 103 -- 99 % -- --  ?03/22/22 1446 (!) 136/98 -- -- (!) 102 -- -- -- --  ?03/22/22 1431 (!) 136/98 -- -- 90 -- -- -- --  ?03/22/22 1430 -- -- -- -- -- 99 % -- --  ?03/22/22 1416 (!) 128/94 -- -- (!) 107 -- -- -- --  ?03/22/22 1400 (!) 129/91 -- -- (!) 103 -- 99 % -- --  ?03/22/22 1349 130/89 -- -- (!) 108 -- -- -- --  ?03/22/22 1321 (!) 139/95 98.5 ?F (36.9 ?C) -- 90 18 -- 5\' 5"  (1.651 m) 72.1 kg  ? ?General: NAD ?Heart: Regular rate ?Lungs: Normal rate and effort ?Abd: Soft, NT, Gravid, S=D ?Extremities: 2+ Pedal edema ?Neuro: 2+ deep tendon reflexes, No clonus ?Pelvic: NEFG, no bleeding or LOF.   ?Dilation: 1 ?Exam by:: [email or web address], CNM ? ?EFM: 155, Moderate variability, 15 x 15 accelerations, no decelerations ?Toco: Q 4-12, mild ? ?Results for orders placed or performed during the hospital encounter of 03/22/22 (from the  past 24 hour(s))  ?CBC     Status: Abnormal  ? Collection Time: 03/22/22  1:51 PM  ?Result Value Ref Range  ? WBC 7.3 4.0 - 10.5 K/uL  ? RBC 3.14 (L) 3.87 - 5.11 MIL/uL  ? Hemoglobin 9.2 (L) 12.0 - 15.0 g/dL  ? HCT 27.9 (L) 36.0 - 46.0 %  ? MCV 88.9 80.0 - 100.0 fL  ? MCH 29.3 26.0 - 34.0 pg  ? MCHC 33.0 30.0 - 36.0 g/dL  ? RDW 12.2 11.5 - 15.5 %  ? Platelets 157 150 - 400 K/uL  ? nRBC 0.0 0.0 - 0.2 %  ?Comprehensive metabolic panel     Status: Abnormal  ? Collection Time: 03/22/22  1:51 PM  ?Result Value Ref Range  ? Sodium 139 135 - 145 mmol/L  ? Potassium 3.8 3.5 - 5.1 mmol/L  ? Chloride 108 98 - 111 mmol/L  ? CO2 23 22 - 32 mmol/L  ? Glucose, Bld 132 (H) 70 - 99 mg/dL  ? BUN <5 (L) 6 - 20 mg/dL  ? Creatinine, Ser 0.68 0.44 - 1.00 mg/dL  ? Calcium 8.2 (L) 8.9 - 10.3 mg/dL  ?  Total Protein 5.2 (L) 6.5 - 8.1 g/dL  ? Albumin 2.6 (L) 3.5 - 5.0 g/dL  ? AST 18 15 - 41 U/L  ? ALT 11 0 - 44 U/L  ? Alkaline Phosphatase 219 (H) 38 - 126 U/L  ? Total Bilirubin 0.3 0.3 - 1.2 mg/dL  ? GFR, Estimated >60 >60 mL/min  ? Anion gap 8 5 - 15  ?Protein / creatinine ratio, urine     Status: Abnormal  ? Collection Time: 03/22/22  2:06 PM  ?Result Value Ref Range  ? Creatinine, Urine 358.28 mg/dL  ? Total Protein, Urine 109 mg/dL  ? Protein Creatinine Ratio 0.30 (H) 0.00 - 0.15 mg/mg[Cre]  ?Urinalysis, Routine w reflex microscopic Urine, Clean Catch     Status: Abnormal  ? Collection Time: 03/22/22  2:06 PM  ?Result Value Ref Range  ? Color, Urine AMBER (A) YELLOW  ? APPearance CLOUDY (A) CLEAR  ? Specific Gravity, Urine 1.023 1.005 - 1.030  ? pH 5.0 5.0 - 8.0  ? Glucose, UA NEGATIVE NEGATIVE mg/dL  ? Hgb urine dipstick NEGATIVE NEGATIVE  ? Bilirubin Urine NEGATIVE NEGATIVE  ? Ketones, ur NEGATIVE NEGATIVE mg/dL  ? Protein, ur 100 (A) NEGATIVE mg/dL  ? Nitrite NEGATIVE NEGATIVE  ? Leukocytes,Ua MODERATE (A) NEGATIVE  ? RBC / HPF 0-5 0 - 5 RBC/hpf  ? WBC, UA 21-50 0 - 5 WBC/hpf  ? Bacteria, UA FEW (A) NONE SEEN  ? Squamous Epithelial /  LPF 21-50 0 - 5  ? Mucus PRESENT   ? Non Squamous Epithelial 0-5 (A) NONE SEEN  ? ? ?MAU Course ?Orders Placed This Encounter  ?Procedures  ? Culture, beta strep (group b only)  ?  Standing Status:   Standing  ?  Number of Occurrences:   1  ? CBC  ?  Standing Status:   Standing  ?  Number of Occurrences:   1  ? Comprehensive metabolic panel  ?  Standing Status:   Standing  ?  Number of Occurrences:   1  ? Protein / creatinine ratio, urine  ?  Standing Status:   Standing  ?  Number of Occurrences:   1  ? Urinalysis, Routine w reflex microscopic Urine, Clean Catch  ?  Standing Status:   Standing  ?  Number of Occurrences:   1  ? Vital signs  ?  Standing Status:   Standing  ?  Number of Occurrences:   1  ? Discharge patient  ?  Order Specific Question:   Discharge disposition  ?  Answer:   01-Home or Self Care [1]  ?  Order Specific Question:   Discharge patient date  ?  Answer:   03/22/2022  ? ?Meds ordered this encounter  ?Medications  ? acetaminophen (TYLENOL) tablet 1,000 mg  ? betamethasone acetate-betamethasone sodium phosphate (CELESTONE) injection 12 mg  ? ?Headache resolved spontaneously.  Declined Tylenol. ? ?MDM ?-Preeclampsia without severe features patient blood pressure and elevated protein creatinine ratio.  Discussed provider will likely recommend delivery at 37 weeks and once she develops symptoms of severe preeclampsia.  Precautions reviewed.  Betamethasone given in keeping with Dr. Berenda Morale plan. Will have patient return in 24 hours for second dose. ? ?-Preterm contractions without evidence of active preterm labor.  GBS collected in case she does not active preterm delivery. ? ?A: [redacted]w[redacted]d week IUP ?Mild preeclampsia ?FHR reactive ? ?P: Discharge home in stable condition per consult with Dr. Donavan Foil ?Preeclampsia precautions. ?Follow-up for blood pressure check in  3 days at your doctor's office sooner as needed if symptoms worsen. ?Return to maternity admissions as needed in emergencies ? ?Katrinka BlazingSmith,  [place], CNM ?03/22/2022 ?5:08 PM ? ?

## 2022-03-22 NOTE — MAU Note (Signed)
.  Andrea Wilson is a 21 y.o. at [redacted]w[redacted]d here in MAU reporting: she went to [facility] for ctx that where 5-7 min apart. Checked and she was 1cm .took procardia at 6:30 am to help with slowing ctx.  Pt sent from office for elevated b/p. Stated she had a headache this morning that is still going on. Good fetal movement felt.  ?Onset of complaint: this morning ?Pain score: ctx 3. Headache 2 ?Vitals:  ? 03/22/22 1321  ?BP: (!) 139/95  ?Pulse: 90  ?Resp: 18  ?Temp: 98.5 ?F (36.9 ?C)  ?   ?FHT:140 ?Lab orders placed from triage:   ? ?

## 2022-03-23 ENCOUNTER — Inpatient Hospital Stay (EMERGENCY_DEPARTMENT_HOSPITAL)
Admission: AD | Admit: 2022-03-23 | Discharge: 2022-03-23 | Disposition: A | Discharge status: Home / Self Care | Payer: 59 | Source: Home / Self Care | Attending: Obstetrics and Gynecology | Admitting: Obstetrics and Gynecology

## 2022-03-23 ENCOUNTER — Other Ambulatory Visit: Payer: Self-pay

## 2022-03-23 DIAGNOSIS — O1493 Unspecified pre-eclampsia, third trimester: Secondary | ICD-10-CM | POA: Insufficient documentation

## 2022-03-23 DIAGNOSIS — Z3A34 34 weeks gestation of pregnancy: Secondary | ICD-10-CM | POA: Insufficient documentation

## 2022-03-23 MED ORDER — BETAMETHASONE SOD PHOS & ACET 6 (3-3) MG/ML IJ SUSP
12.0000 mg | Freq: Once | INTRAMUSCULAR | Status: AC
  Administered 2022-03-23: 12 mg via INTRAMUSCULAR

## 2022-03-23 NOTE — MAU Provider Note (Signed)
?  Chief Complaint: BMZ injection ? ? None  ?  ? ?SUBJECTIVE ?HPI: Andrea Wilson is a 21 y.o. G1P0 who presents to maternity admissions for a 2nd dose of BMZ. She was diagnosed with preeclampsia on 4/18 and received her first dose of BMZ. She denies any OB complaints at this time.  ? ?Past Medical History:  ?Diagnosis Date  ? Allergy   ? Anxiety   ? Depression   ? Frequent headaches   ? Hypotension   ? ?Past Surgical History:  ?Procedure Laterality Date  ? KNEE SURGERY    ? TOOTH EXTRACTION    ? ?Social History  ? ?Socioeconomic History  ? Marital status: Single  ?  Spouse name: Not on file  ? Number of children: Not on file  ? Years of education: Not on file  ? Highest education level: Not on file  ?Occupational History  ? Not on file  ?Tobacco Use  ? Smoking status: Never  ? Smokeless tobacco: Never  ?Vaping Use  ? Vaping Use: Never used  ?Substance and Sexual Activity  ? Alcohol use: No  ? Drug use: No  ? Sexual activity: Never  ?Other Topics Concern  ? Not on file  ?Social History Narrative  ? 12th grade student at  [employer]. She does well in school.  ? Lives with mother and brother. Her sister passed away in 03/31/13 at age 30 from cancer.  ? 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   ? ?Social Determinants of Health  ? ?Financial Resource Strain: Not on file  ?Food Insecurity: Not on file  ?Transportation Needs: Not on file  ?Physical Activity: Not on file  ?Stress: Not on file  ?Social Connections: Not on file  ?Intimate Partner Violence: Not on file  ? ?No current facility-administered medications on file prior to encounter.  ? ?Current Outpatient Medications on File Prior to Encounter  ?Medication Sig Dispense Refill  ? ondansetron (ZOFRAN) 4 MG tablet Take 1 tablet (4 mg total) by mouth every 8 (eight) hours as needed for nausea or vomiting. 30 tablet 0  ? ?No Known Allergies ? ?ROS:  ?Review of Systems  ?Eyes:  Negative for photophobia and visual disturbance.  ?Gastrointestinal:  Negative for  abdominal pain.  ?Neurological:  Negative for headaches.  ? ?I have reviewed patient's Past Medical Hx, Surgical Hx, Family Hx, Social Hx, medications and allergies.  ? ?Physical Exam  ?Patient Vitals for the past 24 hrs: ? BP Temp Temp src Pulse Resp SpO2  ?03/23/22 1654 126/85 98.2 ?F (36.8 ?C) Oral (!) 116 19 97 %  ? ?Physical Exam ?Constitutional:   ?   General: She is not in acute distress. ?   Appearance: Normal appearance. She is not ill-appearing, toxic-appearing or diaphoretic.  ?Skin: ?   General: Skin is warm.  ?Neurological:  ?   Mental Status: She is alert and oriented to person, place, and time.  ? ? ?MDM ? ?2nd dose of BMZ given ?MSE done ? ? ?ASSESSMENT ?MSE Complete ? ?PLAN ?Discharge patient  ?Keep follow up with OB ? ?Lezlie Lye, NP ?03/23/2022 7:03 PM   ?

## 2022-03-23 NOTE — MAU Note (Signed)
Andrea Wilson is a 21 y.o. at [redacted]w[redacted]d here in MAU reporting: here to receive 2nd dose of BMZ.  Also reports took BP @ home today, BP 136/104.  Denies H/A, visual disturbances, and epigastric pain. ? ?Onset of complaint: today ?Pain score: 0 ?Vitals:  ? 03/23/22 1654  ?BP: 126/85  ?Pulse: (!) 116  ?Resp: 19  ?Temp: 98.2 ?F (36.8 ?C)  ?SpO2: 97%  ?   ?FHT:135 bpm w/ +FM ?Lab orders placed from triage:   None ?

## 2022-03-24 LAB — CULTURE, BETA STREP (GROUP B ONLY)

## 2022-03-25 ENCOUNTER — Inpatient Hospital Stay (HOSPITAL_COMMUNITY)
Admission: AD | Admit: 2022-03-25 | Discharge: 2022-03-28 | DRG: 768 | Disposition: A | Discharge status: Home / Self Care | Payer: 59 | Attending: Obstetrics and Gynecology | Admitting: Obstetrics and Gynecology

## 2022-03-25 ENCOUNTER — Encounter (HOSPITAL_COMMUNITY): Payer: Self-pay | Admitting: Obstetrics

## 2022-03-25 DIAGNOSIS — Z3A34 34 weeks gestation of pregnancy: Secondary | ICD-10-CM

## 2022-03-25 DIAGNOSIS — F419 Anxiety disorder, unspecified: Secondary | ICD-10-CM | POA: Diagnosis present

## 2022-03-25 DIAGNOSIS — O1413 Severe pre-eclampsia, third trimester: Secondary | ICD-10-CM | POA: Diagnosis not present

## 2022-03-25 DIAGNOSIS — O99344 Other mental disorders complicating childbirth: Secondary | ICD-10-CM | POA: Diagnosis present

## 2022-03-25 DIAGNOSIS — O1414 Severe pre-eclampsia complicating childbirth: Secondary | ICD-10-CM | POA: Diagnosis present

## 2022-03-25 LAB — PROTEIN / CREATININE RATIO, URINE
Creatinine, Urine: 36.29 mg/dL
Protein Creatinine Ratio: 1.16 mg/mg{Cre} — ABNORMAL HIGH (ref 0.00–0.15)
Total Protein, Urine: 42 mg/dL

## 2022-03-25 LAB — CBC
HCT: 28.1 % — ABNORMAL LOW (ref 36.0–46.0)
Hemoglobin: 9 g/dL — ABNORMAL LOW (ref 12.0–15.0)
MCH: 29 pg (ref 26.0–34.0)
MCHC: 32 g/dL (ref 30.0–36.0)
MCV: 90.6 fL (ref 80.0–100.0)
Platelets: 217 10*3/uL (ref 150–400)
RBC: 3.1 MIL/uL — ABNORMAL LOW (ref 3.87–5.11)
RDW: 12.5 % (ref 11.5–15.5)
WBC: 8.6 10*3/uL (ref 4.0–10.5)
nRBC: 0.3 % — ABNORMAL HIGH (ref 0.0–0.2)

## 2022-03-25 LAB — URINALYSIS, ROUTINE W REFLEX MICROSCOPIC
Bilirubin Urine: NEGATIVE
Glucose, UA: NEGATIVE mg/dL
Hgb urine dipstick: NEGATIVE
Ketones, ur: NEGATIVE mg/dL
Nitrite: NEGATIVE
Protein, ur: 30 mg/dL — AB
Specific Gravity, Urine: 1.005 (ref 1.005–1.030)
pH: 7 (ref 5.0–8.0)

## 2022-03-25 LAB — COMPREHENSIVE METABOLIC PANEL
ALT: 16 U/L (ref 0–44)
AST: 26 U/L (ref 15–41)
Albumin: 2.6 g/dL — ABNORMAL LOW (ref 3.5–5.0)
Alkaline Phosphatase: 250 U/L — ABNORMAL HIGH (ref 38–126)
Anion gap: 6 (ref 5–15)
BUN: 7 mg/dL (ref 6–20)
CO2: 24 mmol/L (ref 22–32)
Calcium: 8.1 mg/dL — ABNORMAL LOW (ref 8.9–10.3)
Chloride: 110 mmol/L (ref 98–111)
Creatinine, Ser: 0.61 mg/dL (ref 0.44–1.00)
GFR, Estimated: >60 mL/min (ref >60)
Glucose, Bld: 80 mg/dL (ref 70–99)
Potassium: 3.5 mmol/L (ref 3.5–5.1)
Sodium: 140 mmol/L (ref 135–145)
Total Bilirubin: 0.6 mg/dL (ref 0.3–1.2)
Total Protein: 5.3 g/dL — ABNORMAL LOW (ref 6.5–8.1)

## 2022-03-25 MED ORDER — PHENYLEPHRINE 80 MCG/ML (10ML) SYRINGE FOR IV PUSH (FOR BLOOD PRESSURE SUPPORT)
80.0000 ug | PREFILLED_SYRINGE | INTRAVENOUS | Status: DC | PRN
  Filled 2022-03-25 (×3): qty 10

## 2022-03-25 MED ORDER — PHENYLEPHRINE 80 MCG/ML (10ML) SYRINGE FOR IV PUSH (FOR BLOOD PRESSURE SUPPORT)
80.0000 ug | PREFILLED_SYRINGE | INTRAVENOUS | Status: DC | PRN
Start: 2022-03-25 — End: 2022-03-26
  Filled 2022-03-25 (×2): qty 10

## 2022-03-25 MED ORDER — LABETALOL HCL 5 MG/ML IV SOLN
80.0000 mg | INTRAVENOUS | Status: DC | PRN
Start: 2022-03-25 — End: 2022-03-28

## 2022-03-25 MED ORDER — MAGNESIUM SULFATE BOLUS VIA INFUSION
4.0000 g | Freq: Once | INTRAVENOUS | Status: AC
  Administered 2022-03-25: 4 g via INTRAVENOUS
  Filled 2022-03-25: qty 1000

## 2022-03-25 MED ORDER — LACTATED RINGERS IV SOLN: INTRAVENOUS | Status: DC

## 2022-03-25 MED ORDER — OXYCODONE-ACETAMINOPHEN 5-325 MG PO TABS: 2.0000 | ORAL_TABLET | ORAL | Status: DC | PRN

## 2022-03-25 MED ORDER — TERBUTALINE SULFATE 1 MG/ML IJ SOLN: 0.2500 mg | Freq: Once | INTRAMUSCULAR | Status: DC | PRN

## 2022-03-25 MED ORDER — SOD CITRATE-CITRIC ACID 500-334 MG/5ML PO SOLN: 30.0000 mL | ORAL | Status: DC | PRN

## 2022-03-25 MED ORDER — LABETALOL HCL 5 MG/ML IV SOLN: 40.0000 mg | INTRAVENOUS | Status: DC | PRN

## 2022-03-25 MED ORDER — MAGNESIUM SULFATE 40 GM/1000ML IV SOLN
1.0000 g/h | INTRAVENOUS | Status: AC
  Administered 2022-03-25 – 2022-03-26 (×2): 2 g/h via INTRAVENOUS
  Administered 2022-03-27: 1 g/h via INTRAVENOUS
  Filled 2022-03-25 (×3): qty 1000

## 2022-03-25 MED ORDER — OXYTOCIN-SODIUM CHLORIDE 30-0.9 UT/500ML-% IV SOLN
2.5000 [IU]/h | INTRAVENOUS | Status: DC
  Administered 2022-03-26: 2.5 [IU]/h via INTRAVENOUS
  Filled 2022-03-25 (×2): qty 500

## 2022-03-25 MED ORDER — LIDOCAINE HCL (PF) 1 % IJ SOLN: 30.0000 mL | INTRAMUSCULAR | Status: DC | PRN

## 2022-03-25 MED ORDER — ACETAMINOPHEN 500 MG PO TABS
1000.0000 mg | ORAL_TABLET | Freq: Once | ORAL | Status: AC
  Administered 2022-03-25: 1000 mg via ORAL
  Filled 2022-03-25: qty 2

## 2022-03-25 MED ORDER — LACTATED RINGERS IV SOLN: 500.0000 mL | INTRAVENOUS | Status: DC | PRN

## 2022-03-25 MED ORDER — DIPHENHYDRAMINE HCL 50 MG/ML IJ SOLN: 12.5000 mg | INTRAMUSCULAR | Status: DC | PRN

## 2022-03-25 MED ORDER — HYDRALAZINE HCL 20 MG/ML IJ SOLN: 10.0000 mg | INTRAMUSCULAR | Status: DC | PRN

## 2022-03-25 MED ORDER — LABETALOL HCL 5 MG/ML IV SOLN
20.0000 mg | INTRAVENOUS | Status: DC | PRN
  Administered 2022-03-25: 20 mg via INTRAVENOUS

## 2022-03-25 MED ORDER — FENTANYL CITRATE (PF) 100 MCG/2ML IJ SOLN
50.0000 ug | INTRAMUSCULAR | Status: DC | PRN
  Administered 2022-03-25: 100 ug via INTRAVENOUS
  Filled 2022-03-25: qty 2

## 2022-03-25 MED ORDER — FENTANYL-BUPIVACAINE-NACL 0.5-0.125-0.9 MG/250ML-% EP SOLN
12.0000 mL/h | EPIDURAL | Status: DC | PRN
  Filled 2022-03-25: qty 250

## 2022-03-25 MED ORDER — LABETALOL HCL 5 MG/ML IV SOLN: 80.0000 mg | INTRAVENOUS | Status: DC | PRN

## 2022-03-25 MED ORDER — LABETALOL HCL 5 MG/ML IV SOLN
40.0000 mg | INTRAVENOUS | Status: DC | PRN
  Filled 2022-03-25: qty 8

## 2022-03-25 MED ORDER — OXYTOCIN-SODIUM CHLORIDE 30-0.9 UT/500ML-% IV SOLN
1.0000 m[IU]/min | INTRAVENOUS | Status: DC
  Administered 2022-03-25: 1 m[IU]/min via INTRAVENOUS

## 2022-03-25 MED ORDER — OXYTOCIN BOLUS FROM INFUSION
333.0000 mL | Freq: Once | INTRAVENOUS | Status: AC
  Administered 2022-03-26: 333 mL via INTRAVENOUS

## 2022-03-25 MED ORDER — MISOPROSTOL 25 MCG QUARTER TABLET
25.0000 ug | ORAL_TABLET | ORAL | Status: DC | PRN
  Administered 2022-03-25 (×2): 25 ug via VAGINAL
  Filled 2022-03-25 (×2): qty 1

## 2022-03-25 MED ORDER — LACTATED RINGERS IV SOLN
500.0000 mL | Freq: Once | INTRAVENOUS | Status: AC
  Administered 2022-03-26: 500 mL via INTRAVENOUS

## 2022-03-25 MED ORDER — EPHEDRINE 5 MG/ML INJ: 10.0000 mg | INTRAVENOUS | Status: DC | PRN

## 2022-03-25 MED ORDER — LABETALOL HCL 5 MG/ML IV SOLN
20.0000 mg | INTRAVENOUS | Status: DC | PRN
  Filled 2022-03-25: qty 4

## 2022-03-25 MED ORDER — VENLAFAXINE HCL ER 150 MG PO CP24
150.0000 mg | ORAL_CAPSULE | Freq: Every day | ORAL | Status: DC
  Administered 2022-03-25 – 2022-03-27 (×3): 150 mg via ORAL
  Filled 2022-03-25 (×6): qty 1

## 2022-03-25 MED ORDER — OXYTOCIN-SODIUM CHLORIDE 30-0.9 UT/500ML-% IV SOLN
1.0000 m[IU]/min | INTRAVENOUS | Status: DC
  Administered 2022-03-25: 6 m[IU]/min via INTRAVENOUS

## 2022-03-25 MED ORDER — ACETAMINOPHEN 325 MG PO TABS
650.0000 mg | ORAL_TABLET | ORAL | Status: DC | PRN
  Administered 2022-03-25 – 2022-03-26 (×2): 650 mg via ORAL
  Filled 2022-03-25 (×2): qty 2

## 2022-03-25 MED ORDER — OXYCODONE-ACETAMINOPHEN 5-325 MG PO TABS: 1.0000 | ORAL_TABLET | ORAL | Status: DC | PRN

## 2022-03-25 MED ORDER — ONDANSETRON HCL 4 MG/2ML IJ SOLN
4.0000 mg | Freq: Four times a day (QID) | INTRAMUSCULAR | Status: DC | PRN
  Administered 2022-03-26: 4 mg via INTRAVENOUS
  Filled 2022-03-25: qty 2

## 2022-03-25 MED ORDER — ONDANSETRON 4 MG PO TBDP
4.0000 mg | ORAL_TABLET | Freq: Once | ORAL | Status: AC
  Administered 2022-03-25: 4 mg via ORAL
  Filled 2022-03-25: qty 1

## 2022-03-25 NOTE — MAU Note (Signed)
Andrea Wilson is a 21 y.o. at [redacted]w[redacted]d here in MAU reporting: came in on the 18th, dx with mild pre-e.  Has been monitoring BP. Had an appt this morning, BP was 15?/108;162/104, also  having some tightening contractions.  Was told she may be admitted.  HA has been coming or going- denies epigastric pain. Reports increase in swelling, denies visual changes. ? ? ?Onset of complaint: past wk ?Pain score: 5/10- HA ?There were no vitals filed for this visit.   ?FHT:140 ?Lab orders placed from triage:  urine ?

## 2022-03-25 NOTE — MAU Provider Note (Signed)
?History  ?  ? ?CSN: [id number] ? ?Arrival date and time: 03/25/22 1009 ? ? None  ?  ? ?Chief Complaint  ?Patient presents with  ? Hypertension  ? Headache  ? ?HPI ? ?Andrea Wilson is a 21 y.o. female G1P0 @ 110w6d here in MAU with severe range BP's and new onset HA. Her HA started at the arrival to MAU. She was in the [facility] office today and reports BP's greater than 0000000 systolic. She reports + fetal movement. No bleeding or leaking of fluid. She did complete a dose of steroids this week. She has no vision changes or upper abdominal pain.  ? ?OB History   ? ? Gravida  ?1  ? Para  ?   ? Term  ?   ? Preterm  ?   ? AB  ?   ? Living  ?   ?  ? ? SAB  ?   ? IAB  ?   ? Ectopic  ?   ? Multiple  ?   ? Live Births  ?   ?   ?  ?  ? ? ?Past Medical History:  ?Diagnosis Date  ? Allergy   ? Anxiety   ? Depression   ? Frequent headaches   ? Hypotension   ? ? ?Past Surgical History:  ?Procedure Laterality Date  ? KNEE SURGERY    ? TOOTH EXTRACTION    ? ? ?Family History  ?Problem Relation Age of Onset  ? Hyperlipidemia Mother   ? Anxiety disorder Mother   ? Depression Mother   ? Hypertension Father   ? Cancer Sister   ? Uterine cancer Maternal Grandmother   ? Lung cancer Paternal Grandfather   ? Migraines Other   ?     Mfhx of migraine  ? Seizures Cousin   ? Autism Cousin   ? ? ?Social History  ? ?Tobacco Use  ? Smoking status: Never  ? Smokeless tobacco: Never  ?Vaping Use  ? Vaping Use: Never used  ?Substance Use Topics  ? Alcohol use: No  ? Drug use: No  ? ? ?Allergies: No Known Allergies ? ?Medications Prior to Admission  ?Medication Sig Dispense Refill Last Dose  ? NIFEdipine (PROCARDIA) 10 MG capsule Take 10 mg by mouth 3 (three) times daily.   03/25/2022 at 0815  ? ondansetron (ZOFRAN) 4 MG tablet Take 1 tablet (4 mg total) by mouth every 8 (eight) hours as needed for nausea or vomiting. 30 tablet 0   ? ?No results found for this or any previous visit (from the past 48 hour(s)).  ? ?Review of Systems  ?Constitutional:   Negative for fever.  ?Eyes:  Negative for photophobia and visual disturbance.  ?Gastrointestinal:  Negative for abdominal pain.  ?Neurological:  Positive for headaches.  ?Physical Exam  ? ?Blood pressure (!) 157/105, pulse 70, temperature 98.7 ?F (37.1 ?C), temperature source Oral, resp. rate 18, height 5\' 5"  (1.651 m), weight 71.9 kg, SpO2 98 %. ? ?Patient Vitals for the past 24 hrs: ? BP Temp Temp src Pulse Resp SpO2 Height Weight  ?03/25/22 1504 114/73 -- -- 79 18 -- -- --  ?03/25/22 1402 116/73 -- -- 81 20 -- -- --  ?03/25/22 1306 131/87 -- -- 70 -- -- -- --  ?03/25/22 1302 -- -- -- -- 20 -- -- --  ?03/25/22 1239 (!) 128/94 -- -- 71 20 -- -- --  ?03/25/22 1227 -- 97.9 ?F (36.6 ?C) Oral -- -- -- -- --  ?  03/25/22 1200 126/86 -- -- 74 16 96 % -- --  ?03/25/22 1145 127/84 -- -- 75 -- 97 % -- --  ?03/25/22 1140 127/87 -- -- 83 14 97 % -- --  ?03/25/22 1130 -- -- -- -- 16 -- -- --  ?03/25/22 1120 (!) 145/101 -- -- 68 15 97 % -- --  ?03/25/22 1115 (!) 150/101 -- -- 67 -- 97 % -- --  ?03/25/22 1100 (!) 165/112 -- -- 67 -- 97 % -- --  ?03/25/22 1050 (!) 171/111 -- -- 66 20 -- -- --  ?03/25/22 1029 (!) 157/105 98.7 ?F (37.1 ?C) Oral 70 18 98 % 5\' 5"  (1.651 m) 71.9 kg  ?  ?Results for orders placed or performed during the hospital encounter of 03/25/22 (from the past 48 hour(s))  ?Urinalysis, Routine w reflex microscopic Urine, Clean Catch     Status: Abnormal  ? Collection Time: 03/25/22 10:58 AM  ?Result Value Ref Range  ? Color, Urine STRAW (A) YELLOW  ? APPearance CLEAR CLEAR  ? Specific Gravity, Urine 1.005 1.005 - 1.030  ? pH 7.0 5.0 - 8.0  ? Glucose, UA NEGATIVE NEGATIVE mg/dL  ? Hgb urine dipstick NEGATIVE NEGATIVE  ? Bilirubin Urine NEGATIVE NEGATIVE  ? Ketones, ur NEGATIVE NEGATIVE mg/dL  ? Protein, ur 30 (A) NEGATIVE mg/dL  ? Nitrite NEGATIVE NEGATIVE  ? Leukocytes,Ua TRACE (A) NEGATIVE  ? WBC, UA 0-5 0 - 5 WBC/hpf  ? Bacteria, UA RARE (A) NONE SEEN  ? Squamous Epithelial / LPF 0-5 0 - 5  ?  Comment: Performed  at [facility], [place] [address], [place], [place] [address]  ?Protein / creatinine ratio, urine     Status: Abnormal  ? Collection Time: 03/25/22 10:58 AM  ?Result Value Ref Range  ? Creatinine, Urine 36.29 mg/dL  ? Total Protein, Urine 42 mg/dL  ?  Comment: NO NORMAL RANGE ESTABLISHED FOR THIS TEST  ? Protein Creatinine Ratio 1.16 (H) 0.00 - 0.15 mg/mg[Cre]  ?  Comment: Performed at [facility], [place] [address] [place], [place] [address]  ?CBC     Status: Abnormal  ? Collection Time: 03/25/22 11:23 AM  ?Result Value Ref Range  ? WBC 8.6 4.0 - 10.5 K/uL  ? RBC 3.10 (L) 3.87 - 5.11 MIL/uL  ? Hemoglobin 9.0 (L) 12.0 - 15.0 g/dL  ? HCT 28.1 (L) 36.0 - 46.0 %  ? MCV 90.6 80.0 - 100.0 fL  ? MCH 29.0 26.0 - 34.0 pg  ? MCHC 32.0 30.0 - 36.0 g/dL  ? RDW 12.5 11.5 - 15.5 %  ? Platelets 217 150 - 400 K/uL  ? nRBC 0.3 (H) 0.0 - 0.2 %  ?  Comment: Performed at [facility], [place] [address] [place], [place] [address]  ?Comprehensive metabolic panel     Status: Abnormal  ? Collection Time: 03/25/22 11:23 AM  ?Result Value Ref Range  ? Sodium 140 135 - 145 mmol/L  ? Potassium 3.5 3.5 - 5.1 mmol/L  ? Chloride 110 98 - 111 mmol/L  ? CO2 24 22 - 32 mmol/L  ? Glucose, Bld 80 70 - 99 mg/dL  ?  Comment: Glucose reference range applies only to samples taken after fasting for at least 8 hours.  ? BUN 7 6 - 20 mg/dL  ? Creatinine, Ser 0.61 0.44 - 1.00 mg/dL  ? Calcium 8.1 (L) 8.9 - 10.3 mg/dL  ? Total Protein 5.3 (L) 6.5 - 8.1 g/dL  ?  Albumin 2.6 (L) 3.5 - 5.0 g/dL  ? AST 26 15 - 41 U/L  ? ALT 16 0 - 44 U/L  ? Alkaline Phosphatase 250 (H) 38 - 126 U/L  ? Total Bilirubin 0.6 0.3 - 1.2 mg/dL  ? GFR, Estimated >60 >60 mL/min  ?  Comment: (NOTE) ?Calculated using the CKD-EPI Creatinine Equation (2021) ?  ? Anion gap 6 5 - 15  ?  Comment: Performed at [facility], [place] [address] [place], [place] [address]  ?Type and screen [place]     Status: None  ? Collection Time: 03/25/22 11:23 AM  ?Result  Value Ref Range  ? ABO/RH(D) O POS   ? Antibody Screen NEG   ? Sample Expiration    ?  03/28/2022,2359 ?Performed at [facility], [place] [address], [place], [place] [address] ?  ?  ?Physical Exam ?Vitals and nursing note reviewed.  ?Constitutional:   ?   General: She is not in acute distress. ?   Appearance: She is well-developed. She is not ill-appearing, toxic-appearing or diaphoretic.  ?Skin: ?   General: Skin is warm.  ?Neurological:  ?   Mental Status: She is alert and oriented to person, place, and time.  ? ?MAU Course  ?Procedures ? ?MDM ? ?Category 1 fetal tracing  ?11:05: Dr. Carlis Abbott notified of BP readings, patient complaints. Admission orders placed  ?Magnesium started ?Tylenol 1 gram given, patient rates HA pain 0/10.  ? ?Assessment and Plan  ? ?A: ? ?1. Severe preeclampsia, third trimester   ?2. [redacted] weeks gestation of pregnancy   ?  ? ?P: ? ?Admit to labor and delivery ?Dr. Carlis Abbott to enter admission orders ?BP's now improved following 20 mg of IV labetalol ?HA 0/10 ?Continue Magnesium.  ? ? ?Lezlie Lye, NP ?03/25/2022 ?4:04 PM ? ?

## 2022-03-25 NOTE — H&P (Signed)
21 y.o. G1P0 @ [redacted]w[redacted]d presents with preeclampsia, now severe by blood pressure and headache.  She presented initially at [redacted]w[redacted]d with new onset hypertension.  She was worked up in MAU, but due to lack of features was discharged to home.  She did receive a course of betamethasone on 4/18 and 19.  She presented to the office today for a BP check and was found to have severe range BP of 162/104.  Upon arrival to MAU, she reported a new onset headache and had multiple other severe range BPs (171/11, 165/112).  She received one dose of IV labetalol 20mg  and was started on magnesium sulfate.  She is betamethasone complete.  She was transferred to L&D for induction of labor.  Otherwise has good fetal movement and no bleeding.  She denies visual changes or epigastric pain ? ?Pregnancy complicated by: ?Anxiety: stable on venlafaxine ?Rubella Non-immune ?Chlamydia infection: in first trimester, TOC negative ? ?Past Medical History:  ?Diagnosis Date  ? Allergy   ? Anxiety   ? Depression   ? Frequent headaches   ? Hypotension   ?  ?Past Surgical History:  ?Procedure Laterality Date  ? KNEE SURGERY    ? TOOTH EXTRACTION    ?  ?OB History  ?Gravida Para Term Preterm AB Living  ?1            ?SAB IAB Ectopic Multiple Live Births  ?           ?  ?# Outcome Date GA Lbr Len/2nd Weight Sex Delivery Anes PTL Lv  ?1 Current           ?  ?Social History  ? ?Socioeconomic History  ? Marital status: Single  ?  Spouse name: Not on file  ? Number of children: Not on file  ? Years of education: Not on file  ? Highest education level: Not on file  ?Occupational History  ? Not on file  ?Tobacco Use  ? Smoking status: Never  ? Smokeless tobacco: Never  ?Vaping Use  ? Vaping Use: Never used  ?Substance and Sexual Activity  ? Alcohol use: No  ? Drug use: No  ? Sexual activity: Never  ?Other Topics Concern  ? Not on file  ?Social History Narrative  ? 12th grade student at  [employer]. She does well in school.  ? Lives with mother  and brother. Her sister passed away in Apr 09, 2013 at age 13 from cancer.  ? 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   ? ?Social Determinants of Health  ? ?Financial Resource Strain: Not on file  ?Food Insecurity: Not on file  ?Transportation Needs: Not on file  ?Physical Activity: Not on file  ?Stress: Not on file  ?Social Connections: Not on file  ?Intimate Partner Violence: Not on file  ? Patient has no known allergies.  ? ? ?Prenatal Transfer Tool  ?Maternal Diabetes: No ?Genetic Screening: Normal ?Maternal Ultrasounds/Referrals: Normal ?Fetal Ultrasounds or other Referrals:  None ?Maternal Substance Abuse:  No ?Significant Maternal Medications:  Meds include: Other:  venlafaxine as above ?Significant Maternal Lab Results: Group B Strep negative ? ?ABO, Rh: --/--/O POS (04/21 1123) ?Antibody: NEG (04/21 1123) ?Rubella: Nonimmune (10/18 0000) ?RPR: Nonreactive (10/18 0000)  ?HBsAg: Negative (10/18 0000)  ?HIV: Non-reactive (10/18 0000)  ?GBS: Negative/-- (10/18 0000)  ? ? ? ?Vitals:  ? 03/25/22 1306 03/25/22 1402  ?BP: 131/87 116/73  ?Pulse: 70 81  ?Resp:  20  ?Temp:    ?SpO2:    ?  ? ?  General:  NAD ?Abdomen:  soft, gravid ?Ex:  no edema ?SVE:  1/20/-3 per RN ?FHTs:  120s, moderate variability, category 1 ?Toco:  rare contraction ? ?A/P   21 y.o. G1P0 [redacted]w[redacted]d presents with preeclampsia, severe by BP ?IOL: cervical ripening with misoprostol ?Severe preelcampsia:  admission labs normal.  Will trend.  Magnesium sulfate now and continue 24 hours postpartum ?Prematurity: s/p bmz on 4/18 and 4/19.  Will be [redacted]w[redacted]d likely at time of delivery.  Discussed potential need for NICU admission / late preterm infant.  Offered NICU consult.  Declined ?Rubella non-immune ?FSR/ vtx/ GBS negative (culture obtained in MAU on 4/18) ? ?[place] ? ?

## 2022-03-25 NOTE — Progress Notes (Signed)
S/p misoprostol x 2--patient sleeping soundly, denies contractions.   ?No additional severe range BPs since MAU ? ? ?BP 120/81   Pulse 84   Temp 97.7 ?F (36.5 ?C) (Oral)   Resp 18   Ht 5\' 5"  (1.651 m)   Wt 71.9 kg   LMP  (LMP Unknown)   SpO2 96%   BMI 26.39 kg/m?  ? ?Toco: irregular ?EFM: 120s, moderate variability, + accelerations, category 1 ?SVE: 2/50/-3/cephalic, FB placed and filled with 60 mL fluid ? ?A/P: G1 @ [redacted]w[redacted]d with IOL for severe preeclampsia ?IOL: s/p misoprostol x 2, now with FB in place.  WIll start low dose pitocin ?Severe preeclampsia: continue magnesium sulfate.  Hypertensive protocol as needed.   ? ?Gbs negative ?

## 2022-03-26 ENCOUNTER — Encounter (HOSPITAL_COMMUNITY): Payer: Self-pay | Admitting: Obstetrics

## 2022-03-26 ENCOUNTER — Inpatient Hospital Stay (HOSPITAL_COMMUNITY): Payer: 59 | Admitting: Anesthesiology

## 2022-03-26 LAB — CBC
HCT: 26.2 % — ABNORMAL LOW (ref 36.0–46.0)
HCT: 28.5 % — ABNORMAL LOW (ref 36.0–46.0)
HCT: 28.8 % — ABNORMAL LOW (ref 36.0–46.0)
Hemoglobin: 8.4 g/dL — ABNORMAL LOW (ref 12.0–15.0)
Hemoglobin: 9.3 g/dL — ABNORMAL LOW (ref 12.0–15.0)
Hemoglobin: 9.8 g/dL — ABNORMAL LOW (ref 12.0–15.0)
MCH: 29 pg (ref 26.0–34.0)
MCH: 29 pg (ref 26.0–34.0)
MCH: 30.2 pg (ref 26.0–34.0)
MCHC: 32.1 g/dL (ref 30.0–36.0)
MCHC: 32.3 g/dL (ref 30.0–36.0)
MCHC: 34.4 g/dL (ref 30.0–36.0)
MCV: 87.7 fL (ref 80.0–100.0)
MCV: 89.7 fL (ref 80.0–100.0)
MCV: 90.3 fL (ref 80.0–100.0)
Platelets: 165 10*3/uL (ref 150–400)
Platelets: 192 10*3/uL (ref 150–400)
Platelets: 210 10*3/uL (ref 150–400)
RBC: 2.9 MIL/uL — ABNORMAL LOW (ref 3.87–5.11)
RBC: 3.21 MIL/uL — ABNORMAL LOW (ref 3.87–5.11)
RBC: 3.25 MIL/uL — ABNORMAL LOW (ref 3.87–5.11)
RDW: 12.4 % (ref 11.5–15.5)
RDW: 12.5 % (ref 11.5–15.5)
RDW: 12.7 % (ref 11.5–15.5)
WBC: 18.6 10*3/uL — ABNORMAL HIGH (ref 4.0–10.5)
WBC: 6.6 10*3/uL (ref 4.0–10.5)
WBC: 9.2 10*3/uL (ref 4.0–10.5)
nRBC: 0 % (ref 0.0–0.2)
nRBC: 0 % (ref 0.0–0.2)
nRBC: 0.3 % — ABNORMAL HIGH (ref 0.0–0.2)

## 2022-03-26 LAB — RPR: RPR Ser Ql: NONREACTIVE

## 2022-03-26 LAB — DIC (DISSEMINATED INTRAVASCULAR COAGULATION)PANEL
D-Dimer, Quant: 2.42 ug/mL-FEU — ABNORMAL HIGH (ref 0.00–0.50)
Fibrinogen: 225 mg/dL (ref 210–475)
INR: 1 (ref 0.8–1.2)
Platelets: 209 10*3/uL (ref 150–400)
Prothrombin Time: 13.1 seconds (ref 11.4–15.2)
Smear Review: NONE SEEN
aPTT: 25 seconds (ref 24–36)

## 2022-03-26 LAB — PREPARE RBC (CROSSMATCH)

## 2022-03-26 LAB — POSTPARTUM HEMORRHAGE PROTOCOL (BB NOTIFICATION)

## 2022-03-26 LAB — ABO/RH: ABO/RH(D): O POS

## 2022-03-26 LAB — MAGNESIUM: Magnesium: 6.1 mg/dL (ref 1.7–2.4)

## 2022-03-26 MED ORDER — ONDANSETRON HCL 4 MG PO TABS: 4.0000 mg | ORAL_TABLET | ORAL | Status: DC | PRN

## 2022-03-26 MED ORDER — SODIUM CHLORIDE 0.9% IV SOLUTION: Freq: Once | INTRAVENOUS | Status: AC

## 2022-03-26 MED ORDER — OXYTOCIN 10 UNIT/ML IJ SOLN
INTRAMUSCULAR | Status: AC
  Filled 2022-03-26: qty 1

## 2022-03-26 MED ORDER — WITCH HAZEL-GLYCERIN EX PADS: 1.0000 "application " | MEDICATED_PAD | CUTANEOUS | Status: DC | PRN

## 2022-03-26 MED ORDER — FENTANYL-BUPIVACAINE-NACL 0.5-0.125-0.9 MG/250ML-% EP SOLN
EPIDURAL | Status: DC | PRN
  Administered 2022-03-26: 12 mL/h via EPIDURAL

## 2022-03-26 MED ORDER — BENZOCAINE-MENTHOL 20-0.5 % EX AERO: 1.0000 "application " | INHALATION_SPRAY | CUTANEOUS | Status: DC | PRN

## 2022-03-26 MED ORDER — ACETAMINOPHEN 325 MG PO TABS
650.0000 mg | ORAL_TABLET | Freq: Once | ORAL | Status: AC
  Administered 2022-03-26: 650 mg via ORAL
  Filled 2022-03-26: qty 2

## 2022-03-26 MED ORDER — IBUPROFEN 600 MG PO TABS: 600.0000 mg | ORAL_TABLET | Freq: Four times a day (QID) | ORAL | Status: DC

## 2022-03-26 MED ORDER — OXYCODONE HCL 5 MG PO TABS: 5.0000 mg | ORAL_TABLET | ORAL | Status: DC | PRN

## 2022-03-26 MED ORDER — ZOLPIDEM TARTRATE 5 MG PO TABS: 5.0000 mg | ORAL_TABLET | Freq: Every evening | ORAL | Status: DC | PRN

## 2022-03-26 MED ORDER — SIMETHICONE 80 MG PO CHEW: 80.0000 mg | CHEWABLE_TABLET | ORAL | Status: DC | PRN

## 2022-03-26 MED ORDER — TETANUS-DIPHTH-ACELL PERTUSSIS 5-2.5-18.5 LF-MCG/0.5 IM SUSY: 0.5000 mL | PREFILLED_SYRINGE | Freq: Once | INTRAMUSCULAR | Status: DC

## 2022-03-26 MED ORDER — DIBUCAINE (PERIANAL) 1 % EX OINT: 1.0000 "application " | TOPICAL_OINTMENT | CUTANEOUS | Status: DC | PRN

## 2022-03-26 MED ORDER — CARBOPROST TROMETHAMINE 250 MCG/ML IM SOLN
INTRAMUSCULAR | Status: AC
  Administered 2022-03-26: 250 ug
  Filled 2022-03-26: qty 1

## 2022-03-26 MED ORDER — ACETAMINOPHEN 325 MG PO TABS
650.0000 mg | ORAL_TABLET | ORAL | Status: DC | PRN
  Administered 2022-03-26: 650 mg via ORAL
  Filled 2022-03-26: qty 2

## 2022-03-26 MED ORDER — COCONUT OIL OIL: 1.0000 "application " | TOPICAL_OIL | Status: DC | PRN

## 2022-03-26 MED ORDER — NIFEDIPINE ER OSMOTIC RELEASE 30 MG PO TB24
30.0000 mg | ORAL_TABLET | Freq: Every day | ORAL | Status: DC
  Administered 2022-03-26: 30 mg via ORAL
  Filled 2022-03-26: qty 1

## 2022-03-26 MED ORDER — PRENATAL MULTIVITAMIN CH
1.0000 | ORAL_TABLET | Freq: Every day | ORAL | Status: DC
  Administered 2022-03-27: 1 via ORAL
  Filled 2022-03-26: qty 1

## 2022-03-26 MED ORDER — TRANEXAMIC ACID-NACL 1000-0.7 MG/100ML-% IV SOLN
INTRAVENOUS | Status: AC
  Administered 2022-03-26: 1000 mg
  Filled 2022-03-26: qty 100

## 2022-03-26 MED ORDER — LIDOCAINE HCL (PF) 1 % IJ SOLN
INTRAMUSCULAR | Status: DC | PRN
Start: 2022-03-26 — End: 2022-03-26
  Administered 2022-03-26: 2 mL via EPIDURAL
  Administered 2022-03-26: 10 mL via EPIDURAL

## 2022-03-26 MED ORDER — FERROUS SULFATE 325 (65 FE) MG PO TABS
325.0000 mg | ORAL_TABLET | Freq: Two times a day (BID) | ORAL | Status: DC
  Administered 2022-03-27 – 2022-03-28 (×3): 325 mg via ORAL
  Filled 2022-03-26 (×3): qty 1

## 2022-03-26 MED ORDER — SENNOSIDES-DOCUSATE SODIUM 8.6-50 MG PO TABS
2.0000 | ORAL_TABLET | Freq: Every day | ORAL | Status: DC
  Administered 2022-03-27 – 2022-03-28 (×2): 2 via ORAL
  Filled 2022-03-26 (×2): qty 2

## 2022-03-26 MED ORDER — DIPHENHYDRAMINE HCL 25 MG PO CAPS
25.0000 mg | ORAL_CAPSULE | Freq: Four times a day (QID) | ORAL | Status: DC | PRN
  Administered 2022-03-27: 25 mg via ORAL

## 2022-03-26 MED ORDER — OXYTOCIN 10 UNIT/ML IJ SOLN
10.0000 [IU] | Freq: Once | INTRAMUSCULAR | Status: AC
  Administered 2022-03-26: 10 [IU] via INTRAMUSCULAR

## 2022-03-26 MED ORDER — LACTATED RINGERS IV BOLUS
1000.0000 mL | Freq: Once | INTRAVENOUS | Status: AC
  Administered 2022-03-26: 1000 mL via INTRAVENOUS

## 2022-03-26 MED ORDER — LACTATED RINGERS IV SOLN: INTRAVENOUS | Status: DC

## 2022-03-26 MED ORDER — ONDANSETRON HCL 4 MG/2ML IJ SOLN
4.0000 mg | INTRAMUSCULAR | Status: DC | PRN
  Administered 2022-03-27: 4 mg via INTRAVENOUS
  Filled 2022-03-26: qty 2

## 2022-03-26 MED ORDER — OXYCODONE HCL 5 MG PO TABS: 10.0000 mg | ORAL_TABLET | ORAL | Status: DC | PRN

## 2022-03-26 NOTE — Lactation Note (Addendum)
This note was copied from a baby's chart. ?Lactation Consultation Note ? ?Patient Name: Andrea Wilson ?Today's Date: 03/26/2022 ?Reason for consult: L&D Initial assessment;Late-preterm 34-36.6wks;Primapara ?Age:22 hours ? ?Baby latched with relative ease. Mom had been harvesting colostrum antenatally at home. Parents report having a number of 1 mL syringes with colostrum. MGM went to the parents' house to obtain. In the meantime (and to supplement Mom's supply),Dad signed consent for DBM.  ? ?Mom is currently receiving a blood transfusion (EBL of 1800 mL). When she pumps, she'll need size 21 flanges.  ? ?Addendum:  ?MGM returned with a number of 1 mL syringes (some completely full) plus 2 bags of frozen EBM from a maternal cousin. Consent form for milk sharing was signed.  ? ?Infant is currently sleeping and not cueing.  ? ?Milk that is ready to thaw was placed in the refrigerator in Room 107 (postpartum room); EBM that needed to remain frozen was left in Yeti cooler with ice.  ? ?Size 21 flanges and an Nfant Standard nipple were placed in Room 107, also. Dad knows to let staff know if that nipple seems too fast. ? ?A. Virginia Rochester, RN (on [place]) was given an update on the above. Lactation to f/u again this afternoon/evening.   ? ?Remigio Eisenmenger ?03/26/2022, 2:54 PM ? ? ? ?

## 2022-03-26 NOTE — Progress Notes (Signed)
Patient ID: Andrea Wilson, female   DOB: [birth year], 21 y.o.   MRN: [id number] ?Pt doing well but reports continued cramping - thinks exacerbated by JADA. WOuld like removed when possible as also aggravating her hemorrhoid.  ?Otherwise, denies dizziness or palpitations. Bonding well with baby - breastfeeding ?VSS ?GEN - NAD  ?GU - scant blood on pad; vulvar edema  ? ?Plan : turnoff JADA for an hour. If bleeding still stable, ok to remove JADA ?

## 2022-03-26 NOTE — Lactation Note (Signed)
This note was copied from a baby's chart. ?Lactation Consultation Note ? ?Patient Name: Andrea Wilson ?Today's Date: 03/26/2022 ?Reason for consult: Initial assessment;Primapara;1st time breastfeeding;Late-preterm 34-36.6wks;Infant < 6lbs;Other (Comment) (PPH) Pre-eclampsia on MgSO4 ?Age:22 hours ? ?LC in to visit with Andrea Wilson Andrea Wilson and Andrea Wilson and Andrea Wilson.  Andrea Wilson sleeping soundly in bed (snoring).   ? ?Baby latched once in L&D.  Andrea Wilson had a PPH 1800 EBL, received 2 units of PRBCs.  Andrea Wilson has MgSO4 going currently. ? ?Andrea Wilson syringe fed baby 1 ml of Andrea Wilson's colostrum.  Reviewed importance of having baby suckle on his finger while offering the syringe to avoid aspiration.  RN aware of this ? ?LC assisted in teaching Andrea Wilson how to pace bottle feed baby.  Baby took 10 ml colostrum in Nfant (white) slow flow nipple.  Baby took feeding well. ? ?LC set up DEBP and instructed Andrea Wilson on importance of disassembling pump parts, washing, rinsing and air drying parts in separate bin. ? ?Plan written on dry erase board and reviewed every step. ?Encouraged calling for [facility] when Andrea Wilson is awake and ready to start pumping.  ? ?LPTI guidelines provided. ? ?Maternal Data ?Has patient been taught Hand Expression?: Yes ?Does the patient have breastfeeding experience prior to this delivery?: No ? ?Feeding ?Mother's Current Feeding Choice: Breast Milk ?Nipple Type: Nfant Standard Flow (white) ? ?Lactation Tools Discussed/Used ?Tools: Pump;Flanges;Bottle ?Flange Size: 21 (Per Kim LC assessment in L&D) ?Breast pump type: Double-Electric Breast Pump ?Pump Education: Setup, frequency, and cleaning ?Reason for Pumping: Support milk supply/LPTI/<6 lbs ?Pumping frequency: Encouraged to pump every 3 hrs ? ?Interventions ?Interventions: Breast feeding basics reviewed;Breast massage;Hand express;DEBP;Education;Pace feeding;LPT handout/interventions;LC Services brochure ? ?Discharge ?WIC Program: Yes ? ?Consult Status ?Consult Status: Follow-up ?Date: 03/27/22 ?Follow-up type:  In-patient ? ? ? ?Johny Blamer E ?03/26/2022, 6:49 PM ? ? ? ?

## 2022-03-26 NOTE — Anesthesia Preprocedure Evaluation (Signed)
Anesthesia Evaluation  ?Patient identified by MRN, date of birth, ID band ?Patient awake ? ? ? ?Reviewed: ?Allergy & Precautions, Patient's Chart, lab work & pertinent test results ? ?Airway ?Mallampati: II ? ?TM Distance: >3 FB ?Neck ROM: Full ? ? ? Dental ?no notable dental hx. ? ?  ?Pulmonary ?neg pulmonary ROS,  ?  ?Pulmonary exam normal ?breath sounds clear to auscultation ? ? ? ? ? ? Cardiovascular ?hypertension (gest HTN, now preE with SF), Pt. on medications ?Normal cardiovascular exam ?Rhythm:Regular Rate:Normal ? ? ?  ?Neuro/Psych ?negative neurological ROS ? negative psych ROS  ? GI/Hepatic ?negative GI ROS, Neg liver ROS,   ?Endo/Other  ?negative endocrine ROS ? Renal/GU ?negative Renal ROS  ?negative genitourinary ?  ?Musculoskeletal ?negative musculoskeletal ROS ?(+)  ? Abdominal ?  ?Peds ?negative pediatric ROS ?(+)  Hematology ? ?(+) Blood dyscrasia, anemia , Hb 9.3, plt 192   ?Anesthesia Other Findings ? ? Reproductive/Obstetrics ?(+) Pregnancy ? ?  ? ? ? ? ? ? ? ? ? ? ? ? ? ?  ?  ? ? ? ? ? ? ? ? ?Anesthesia Physical ?Anesthesia Plan ? ?ASA: 3 ? ?Anesthesia Plan: Epidural  ? ?Post-op Pain Management:   ? ?Induction:  ? ?PONV Risk Score and Plan: 2 ? ?Airway Management Planned: Natural Airway ? ?Additional Equipment: None ? ?Intra-op Plan:  ? ?Post-operative Plan:  ? ?Informed Consent: I have reviewed the patients History and Physical, chart, labs and discussed the procedure including the risks, benefits and alternatives for the proposed anesthesia with the patient or authorized representative who has indicated his/her understanding and acceptance.  ? ? ? ? ? ?Plan Discussed with:  ? ?Anesthesia Plan Comments:   ? ? ? ? ? ? ?Anesthesia Quick Evaluation ? ?

## 2022-03-26 NOTE — Progress Notes (Addendum)
Patient ID: Andrea Wilson, female   DOB: Sep 14, 2001, 21 y.o.   MRN: [id number] ?Pt doing well. She had reported a HA this am . She was given procardia 30xl due to persistently elevated BP. She reports HA has now resolved - feels better. Tolerating being on MgSO4. +Fns Tolerable contractions ?VS: 116/85, 83, 16 ?GEN - NAD ?EFM - 120s, cat 1 ?TOCO - irreg contractions q 76mins ?SVE -  7/80/-2 ? ?A/P: Prime at 13 0/7wks progressing well in labor on 65mus of pitocin ?         - BP now stable on 30xl procardia;  continue on MgSO4; pending Mg level ?         - AROM performed with clear fluid noted  ?         - Expectant mgmt  ?  ?

## 2022-03-26 NOTE — Anesthesia Procedure Notes (Signed)
Epidural ?Patient location during procedure: OB ?Start time: 03/26/2022 12:47 AM ?End time: 03/26/2022 12:55 AM ? ?Staffing ?Anesthesiologist: Lannie Fields, DO ?Performed: anesthesiologist  ? ?Preanesthetic Checklist ?Completed: patient identified, IV checked, risks and benefits discussed, monitors and equipment checked, pre-op evaluation and timeout performed ? ?Epidural ?Patient position: sitting ?Prep: DuraPrep and site prepped and draped ?Patient monitoring: continuous pulse ox, blood pressure, heart rate and cardiac monitor ?Approach: midline ?Location: L3-L4 ?Injection technique: LOR air ? ?Needle:  ?Needle type: Tuohy  ?Needle gauge: 17 G ?Needle length: 9 cm ?Needle insertion depth: 5 cm ?Catheter type: closed end flexible ?Catheter size: 19 Gauge ?Catheter at skin depth: 10 cm ?Test dose: negative ? ?Assessment ?Sensory level: T8 ?Events: blood not aspirated, injection not painful, no injection resistance, no paresthesia and negative IV test ? ?Additional Notes ?Patient identified. Risks/Benefits/Options discussed with patient including but not limited to bleeding, infection, nerve damage, paralysis, failed block, incomplete pain control, headache, blood pressure changes, nausea, vomiting, reactions to medication both or allergic, itching and postpartum back pain. Confirmed with bedside nurse the patient's most recent platelet count. Confirmed with patient that they are not currently taking any anticoagulation, have any bleeding history or any family history of bleeding disorders. Patient expressed understanding and wished to proceed. All questions were answered. Sterile technique was used throughout the entire procedure. Please see nursing notes for vital signs. Test dose was given through epidural catheter and negative prior to continuing to dose epidural or start infusion. Warning signs of high block given to the patient including shortness of breath, tingling/numbness in hands, complete motor  block, or any concerning symptoms with instructions to call for help. Patient was given instructions on fall risk and not to get out of bed. All questions and concerns addressed with instructions to call with any issues or inadequate analgesia.  Reason for block:procedure for pain ? ? ? ?

## 2022-03-27 ENCOUNTER — Other Ambulatory Visit: Payer: Self-pay

## 2022-03-27 ENCOUNTER — Encounter (HOSPITAL_COMMUNITY): Payer: Self-pay | Admitting: Obstetrics

## 2022-03-27 LAB — CBC
HCT: 25.9 % — ABNORMAL LOW (ref 36.0–46.0)
HCT: 30.3 % — ABNORMAL LOW (ref 36.0–46.0)
Hemoglobin: 8.8 g/dL — ABNORMAL LOW (ref 12.0–15.0)
Hemoglobin: 10.4 g/dL — ABNORMAL LOW (ref 12.0–15.0)
MCH: 29.7 pg (ref 26.0–34.0)
MCH: 29.8 pg (ref 26.0–34.0)
MCHC: 34 g/dL (ref 30.0–36.0)
MCHC: 34.3 g/dL (ref 30.0–36.0)
MCV: 87.5 fL (ref 80.0–100.0)
MCV: 86.8 fL (ref 80.0–100.0)
Platelets: 177 10*3/uL (ref 150–400)
Platelets: 179 10*3/uL (ref 150–400)
RBC: 2.96 MIL/uL — ABNORMAL LOW (ref 3.87–5.11)
RBC: 3.49 MIL/uL — ABNORMAL LOW (ref 3.87–5.11)
RDW: 12.7 % (ref 11.5–15.5)
RDW: 13.9 % (ref 11.5–15.5)
WBC: 15.6 10*3/uL — ABNORMAL HIGH (ref 4.0–10.5)
WBC: 12.6 10*3/uL — ABNORMAL HIGH (ref 4.0–10.5)
nRBC: 0.1 % (ref 0.0–0.2)
nRBC: 0 % (ref 0.0–0.2)

## 2022-03-27 LAB — HEMOGLOBIN AND HEMATOCRIT, BLOOD
HCT: 26.9 % — ABNORMAL LOW (ref 36.0–46.0)
Hemoglobin: 9.4 g/dL — ABNORMAL LOW (ref 12.0–15.0)

## 2022-03-27 LAB — PREPARE RBC (CROSSMATCH)

## 2022-03-27 MED ORDER — COMPLETENATE 29-1 MG PO CHEW
1.0000 | CHEWABLE_TABLET | Freq: Every day | ORAL | Status: DC
  Administered 2022-03-28: 1 via ORAL
  Filled 2022-03-27: qty 1

## 2022-03-27 MED ORDER — DIPHENHYDRAMINE HCL 25 MG PO CAPS
25.0000 mg | ORAL_CAPSULE | Freq: Once | ORAL | Status: AC
  Filled 2022-03-27: qty 1

## 2022-03-27 MED ORDER — IBUPROFEN 100 MG/5ML PO SUSP
600.0000 mg | Freq: Four times a day (QID) | ORAL | Status: DC
  Administered 2022-03-27 – 2022-03-28 (×5): 600 mg via ORAL
  Filled 2022-03-27 (×5): qty 30

## 2022-03-27 MED ORDER — NIFEDIPINE ER OSMOTIC RELEASE 30 MG PO TB24
30.0000 mg | ORAL_TABLET | Freq: Every day | ORAL | Status: DC
  Administered 2022-03-27 – 2022-03-28 (×2): 30 mg via ORAL
  Filled 2022-03-27 (×2): qty 1

## 2022-03-27 MED ORDER — SODIUM CHLORIDE 0.9% IV SOLUTION: Freq: Once | INTRAVENOUS | Status: AC

## 2022-03-27 NOTE — Lactation Note (Signed)
This note was copied from a baby's chart. ?Lactation Consultation Note ? ?Patient Name: Andrea Wilson ?Today's Date: 03/27/2022 ?Reason for consult: Follow-up assessment;Primapara;1st time breastfeeding;Late-preterm 34-36.6wks ?Age:22 hours ? ?LC in to room for follow up. FOB is formula feeding infant during visit, upright and pacing. Transferred ~12 mL of formula. Mother states infant has a good latch but unsure if able to transfer milk. Discussed LPTI guidelines due to infant's current behavior and weight.  ?Mother is using DEBP, initiation setting. Mother reports collecting less than yesterday Encouraged to use DEBP after each feeding. Reviewed milk storage and proper care of parts.   ? ?Plan: ?1-Feed (breast and/or formula) no longer than 30 minutes to preserve energy ?2-Pump using initiation setting or hand express to supplement ?3-Pacing and side-lying position when bottle-feeding formula. ?4-Maternal self-care ? ?Contact LC for support. All questions answered at this time.  ? ? ?Feeding ?Mother's Current Feeding Choice: Breast Milk and Formula ?Nipple Type: Nfant Standard Flow (white) ? ?Lactation Tools Discussed/Used ?Tools: Pump ?Breast pump type: Double-Electric Breast Pump ?Reason for Pumping: LPTI ?Pumping frequency: after feedings ? ?Interventions ?Interventions: Breast feeding basics reviewed;Skin to skin;Expressed milk;DEBP;Education ? ?Discharge ?Pump: DEBP;Manual ?WIC Program: Yes ? ?Consult Status ?Consult Status: Follow-up ?Date: 03/28/22 ?Follow-up type: In-patient ? ? ? ?Yamir Carignan A Higuera Ancidey ?03/27/2022, 12:34 PM ? ? ? ?

## 2022-03-27 NOTE — Social Work (Signed)
MOB was referred for history of depression/anxiety. ?* Referral screened out by Clinical Social Worker because none of the following criteria appear to apply: ?~ History of anxiety/depression during this pregnancy, or of post-partum depression following prior delivery. ?~ Diagnosis of anxiety and/or depression within last 3 years ?OR ?* MOB's symptoms currently being treated with medication and/or therapy. ? ?Please contact the Clinical Social Worker if needs arise, by MOB request, or if MOB scores greater than 9/yes to question 10 on Edinburgh Postpartum Depression Screen.  ? ?Mika Griffitts, LCSW ?Clinical Social Worker ? ?

## 2022-03-27 NOTE — Lactation Note (Signed)
This note was copied from a baby's chart. ?Lactation Consultation Note ?Mom had all ready BF baby for 7 1/2 minutes to Rt. Breast having difficulty latching to Lt. Breast. ?Baby BF well hearing some swallows. ?FOB stated it takes a long time to supplement w.bottle. ?[place] asked if I could give the bottle and see how she does. ?Baby had white nipple. Appeared to be to fast. ?Baby did pace her self taking long suckles. Baby cough a couple of times. ?LC switched to purple nipple, appeared to do some better but still coughed once. ?Asked RN to put in for ST consult. LC feels baby probably need Dr. Saul Fordyce nipple. ?FOB was holding baby in upright position. Praised FOB for all his help to mom. ?FOB stated he started his regimen in 2 1/2 hrs.  Explained to FOB that 35 weekers may need longer than 3 hrs but try in 3 hrs or sooner if baby is cueing.  ?Asked to call for [facility] again tonight if needs assistance or has questions. ? ?Patient Name: Andrea Wilson ?Today's Date: 03/27/2022 ?Reason for consult: Mother's request;Primapara;Infant < 6lbs;Late-preterm 34-36.6wks ?Age:50 hours ? ?Maternal Data ?Has patient been taught Hand Expression?: Yes ?Does the patient have breastfeeding experience prior to this delivery?: No ? ?Feeding ?Mother's Current Feeding Choice: Breast Milk and Donor Milk ?Nipple Type: Nfant Slow Flow (purple) ? ?LATCH Score ?Latch: Grasps breast easily, tongue down, lips flanged, rhythmical sucking. ? ?Audible Swallowing: A few with stimulation ? ?Type of Nipple: Everted at rest and after stimulation ? ?Comfort (Breast/Nipple): Soft / non-tender ? ?Hold (Positioning): Assistance needed to correctly position infant at breast and maintain latch. ? ?LATCH Score: 8 ? ? ?Lactation Tools Discussed/Used ?  ? ?Interventions ?Interventions: Breast feeding basics reviewed;Assisted with latch;Skin to skin;Breast massage;Hand express;Breast compression;Adjust position;Support pillows;Position options ? ?Discharge ?   ? ?Consult Status ?Consult Status: Follow-up ?Date: 03/28/22 ?Follow-up type: In-patient ? ? ? ?Theodoro Kalata ?03/27/2022, 10:14 PM ? ? ? ?

## 2022-03-27 NOTE — Lactation Note (Signed)
This note was copied from a baby's chart. ?Lactation Consultation Note ? ?Patient Name: Girl Calissa Reisler ?Today's Date: 03/27/2022 ?Reason for consult: Follow-up assessment;Mother's request;1st time breastfeeding;Late-preterm 34-36.6wks ?Age:21 hours ?P1, ETI female infant. ?Per mom, infant recently BF for 30 minutes and dad was giving infant 10 mls of ( shared breast milk) when LC was in room. ?Mom was using the DEBP, and previously pumped 3 mls that was in fridge, mom is pumping every 3 hours for 15 minutes on initial setting.  ?LC reminded parents to limit LPTI total feeding to 30 minutes or less. ?Mom did not have any questions or concerns for LC at this time. ?LC mention that frozen milk must be used within 24 hours. ?Mom will continue to follow previous LC recommendations and LPTI feeding policy. ? ? ? ?Maternal Data ?  ? ?Feeding ?Mother's Current Feeding Choice: Breast Milk ? ?LATCH Score ?  ? ?  ? ?  ? ?  ? ?  ? ?  ? ? ?Lactation Tools Discussed/Used ?  ? ?Interventions ?  ? ?Discharge ?  ? ?Consult Status ?Consult Status: Follow-up ?Date: 03/27/22 ?Follow-up type: In-patient ? ? ? ?Vicente Serene ?03/27/2022, 12:55 AM ? ? ? ?

## 2022-03-27 NOTE — Anesthesia Postprocedure Evaluation (Signed)
Anesthesia Post Note ? ?Patient: Andrea Wilson ? ?Procedure(s) Performed: AN AD HOC LABOR EPIDURAL ? ?  ? ?Patient location during evaluation: OB High Risk ?Anesthesia Type: Epidural ?Level of consciousness: awake, oriented and awake and alert ?Pain management: pain level not controlled (Patient complaining of back pain. Says she can't swallow ibuprophen pills. Asked RN to get liquid form and give in addition to the tylenol. Also recommeneded heat to patient to apply to back and asked RN to provide heat packs to patient.Marland Kitchen ) ?Vital Signs Assessment: post-procedure vital signs reviewed and stable ?Respiratory status: spontaneous breathing, respiratory function stable and nonlabored ventilation ?Cardiovascular status: stable ?Postop Assessment: no headache, adequate PO intake, able to ambulate, patient able to bend at knees and no apparent nausea or vomiting ?Anesthetic complications: no ? ? ?No notable events documented. ? ?Last Vitals:  ?Vitals:  ? 03/27/22 0755 03/27/22 0900  ?BP: (!) 136/98   ?Pulse: 80   ?Resp: 15 17  ?Temp: 36.9 ?C   ?SpO2: 98%   ?  ?Last Pain:  ?Vitals:  ? 03/27/22 0800  ?TempSrc:   ?PainSc: 0-No pain  ? ?Pain Goal:   ? ?  ?  ?  ?  ?  ?  ?  ? ?Aydenn Gervin ? ? ? ? ?

## 2022-03-27 NOTE — Progress Notes (Signed)
Post Partum Day 1 ?Subjective: ?tolerating PO, + flatus, and lochia scant since JADA removed last night. Pt had a bout of lightheadedness when tried walking to bathroom last night. States HR increased as well. Has not tried since. SCDs off overnight ( battery not working). Tolerating MgSO4. Breastfeeding - working on Development worker, international aid suck;  latches well. Denies HA, CP or SOB ? ?Objective: ?Blood pressure 110/72, pulse (!) 137, temperature 98.4 ?F (36.9 ?C), temperature source Oral, resp. rate 18, height 5\' 5"  (1.651 m), weight 71.9 kg, SpO2 97 %, unknown if currently breastfeeding. ? ?Physical Exam:  ?General: alert, cooperative, and no distress ?Lochia: appropriate ?Uterine Fundus: firm ?Incision: n/a ?DVT Evaluation: No evidence of DVT seen on physical exam. ?Negative Homan's sign. ? ?Recent Labs  ?  03/26/22 ?1830 03/27/22 ?0442  ?HGB 9.8* 8.8*  ?HCT 28.5* 25.9*  ? ? ?Assessment/Plan:  ?Breastfeeding ?Orthostatic vitals today. If indicated may need another unit of blood ?SCDs while in bed ?D/C MgSO4 at noon ?Monitor BP on procardia 30xl qd ?Possible d/c home tomorrow ? LOS: 2 days  ? ?03/29/22 ?03/27/2022, 7:02 AM  ? ? ?

## 2022-03-28 ENCOUNTER — Other Ambulatory Visit (HOSPITAL_COMMUNITY): Payer: Self-pay

## 2022-03-28 LAB — TYPE AND SCREEN
ABO/RH(D): O POS
Antibody Screen: NEGATIVE
Unit division: 0
Unit division: 0
Unit division: 0
Unit division: 0
Unit division: 0
Unit division: 0
Unit division: 0
Unit division: 0

## 2022-03-28 LAB — BPAM RBC
Blood Product Expiration Date: 202305012359
Blood Product Expiration Date: 202305012359
Blood Product Expiration Date: 202305262359
Blood Product Expiration Date: 202305262359
Blood Product Expiration Date: 202305262359
Blood Product Expiration Date: 202305262359
Blood Product Expiration Date: 202305262359
Blood Product Expiration Date: 202305262359
ISSUE DATE / TIME: 202304221234
ISSUE DATE / TIME: 202304221234
ISSUE DATE / TIME: 202304221237
ISSUE DATE / TIME: 202304221237
ISSUE DATE / TIME: 202304221702
ISSUE DATE / TIME: 202304222031
ISSUE DATE / TIME: 202304231232
ISSUE DATE / TIME: 202304231451
Unit Type and Rh: 5100
Unit Type and Rh: 5100
Unit Type and Rh: 5100
Unit Type and Rh: 5100
Unit Type and Rh: 5100
Unit Type and Rh: 5100
Unit Type and Rh: 9500
Unit Type and Rh: 9500

## 2022-03-28 MED ORDER — ACETAMINOPHEN 325 MG PO TABS: 650.0000 mg | ORAL_TABLET | Freq: Four times a day (QID) | ORAL | Status: AC | PRN

## 2022-03-28 MED ORDER — NIFEDIPINE ER 30 MG PO TB24
30.0000 mg | ORAL_TABLET | Freq: Every day | ORAL | 3 refills | Status: DC
  Filled 2022-03-28: qty 90, 90d supply, fill #0

## 2022-03-28 MED ORDER — FERROUS SULFATE 325 (65 FE) MG PO TABS: 325.0000 mg | ORAL_TABLET | Freq: Every day | ORAL | 3 refills | Status: DC

## 2022-03-28 MED ORDER — ACETAMINOPHEN 325 MG PO TABS: 650.0000 mg | ORAL_TABLET | Freq: Four times a day (QID) | ORAL | Status: DC | PRN

## 2022-03-28 MED ORDER — IBUPROFEN 100 MG/5ML PO SUSP
600.0000 mg | Freq: Four times a day (QID) | ORAL | 3 refills | Status: DC | PRN
  Filled 2022-03-28: qty 240, 2d supply, fill #0

## 2022-03-28 MED ORDER — NIFEDIPINE ER 30 MG PO TB24
30.0000 mg | ORAL_TABLET | Freq: Every day | ORAL | 3 refills | Status: DC
Start: 2022-03-29 — End: 2022-06-14

## 2022-03-28 MED ORDER — IBUPROFEN 100 MG/5ML PO SUSP: 600.0000 mg | Freq: Four times a day (QID) | ORAL | 3 refills | Status: DC | PRN

## 2022-03-28 NOTE — Discharge Summary (Signed)
? ?  Postpartum Discharge Summary ? ?Date of Service updated 03/28/22  ? ?   ?Patient Name: Andrea Wilson ?DOB: Oct 08, 2001 ?MRN: [id number] ? ?Date of admission: 03/25/2022 ?Delivery date:03/26/2022  ?Delivering provider: Carlynn Purl [facility]  ?Date of discharge: 03/28/2022 ? ?Admitting diagnosis: Severe preeclampsia, third trimester [O14.13] ?Intrauterine pregnancy: [redacted]w[redacted]d    ?Secondary diagnosis:  Principal Problem: ?  Severe preeclampsia, third trimester ?Active Problems: ?  SVD (spontaneous vaginal delivery) ?  Postpartum hemorrhage ? ?Additional problems: none    ?Discharge diagnosis: Preterm Pregnancy Delivered, Preeclampsia (severe), and PPH                                              ?Post partum procedures:blood transfusion ?Augmentation: AROM, Pitocin, Cytotec, and IP Foley ?Complications: [id number]? ?Hospital course: Induction of Labor With Vaginal Delivery   ?21y.o. yo G1P0101 at 339w0das admitted to the hospital 03/25/2022 for induction of labor.  Indication for induction: Preeclampsia.  Patient had an uncomplicated labor course as follows: ?Membrane Rupture Time/Date: 9:30 AM ,03/26/2022   ?Delivery Method:Vaginal, Spontaneous  ?Episiotomy: None  ?Lacerations:  1st degree;Vaginal;Cervical  ?Details of delivery can be found in separate delivery note.  Patient had a routine postpartum course. Patient is discharged home 03/28/22. ? ?Newborn Data: ?Birth date:03/26/2022  ?Birth time:12:08 PM  ?Gender:Female  ?Living status:Living  ?Apgars:9 ,9  ?Weight:2600 g  ? ?Magnesium Sulfate received: Yes: Seizure prophylaxis ?BMZ received: Yes ?Rhophylac:No ?MMR:No ?T-DaP:Given prenatally ?Flu: No ?Transfusion:Yes ? ?Physical exam  ?Vitals:  ? 03/27/22 2347 03/28/22 0503 03/28/22 0903 03/28/22 1136  ?BP: 125/78 (!) 136/95 (!) 132/96 (!) 135/94  ?Pulse: 80 79 80 80  ?Resp: '16 16 17 17  ' ?Temp: 98.4 ?F (36.9 ?C) 98.4 ?F (36.9 ?C) 98.4 ?F (36.9 ?C) 98.5 ?F (36.9 ?C)  ?TempSrc: Oral Oral Oral Oral  ?SpO2: 100%   100% 100%  ?Weight:      ?Height:      ? ?General: alert, cooperative, and no distress ?Lochia: appropriate ?Uterine Fundus: firm ?Incision: N/A ?DVT Evaluation: No evidence of DVT seen on physical exam. ?Labs: ?Lab Results  ?Component Value Date  ? WBC 12.6 (H) 03/27/2022  ? HGB 10.4 (L) 03/27/2022  ? HCT 30.3 (L) 03/27/2022  ? MCV 86.8 03/27/2022  ? PLT 179 03/27/2022  ? ? ?  Latest Ref Rng & Units 03/25/2022  ? 11:23 AM  ?CMP  ?Glucose 70 - 99 mg/dL 80    ?BUN 6 - 20 mg/dL 7    ?Creatinine 0.44 - 1.00 mg/dL 0.61    ?Sodium 135 - 145 mmol/L 140    ?Potassium 3.5 - 5.1 mmol/L 3.5    ?Chloride 98 - 111 mmol/L 110    ?CO2 22 - 32 mmol/L 24    ?Calcium 8.9 - 10.3 mg/dL 8.1    ?Total Protein 6.5 - 8.1 g/dL 5.3    ?Total Bilirubin 0.3 - 1.2 mg/dL 0.6    ?Alkaline Phos 38 - 126 U/L 250    ?AST 15 - 41 U/L 26    ?ALT 0 - 44 U/L 16    ? ?Edinburgh Score: ? ?  03/27/2022  ?  4:00 PM  ?Edinburgh Postnatal Depression Scale Screening Tool  ?I have been able to laugh and see the funny side of things. 0  ?I have looked forward with enjoyment to things. 0  ?I  have blamed myself unnecessarily when things went wrong. 0  ?I have been anxious or worried for no good reason. 0  ?I have felt scared or panicky for no good reason. 0  ?Things have been getting on top of me. 0  ?I have been so unhappy that I have had difficulty sleeping. 0  ?I have felt sad or miserable. 0  ?I have been so unhappy that I have been crying. 0  ?The thought of harming myself has occurred to me. 0  ?Edinburgh Postnatal Depression Scale Total 0  ? ? ? ? ?After visit meds:  ?Allergies as of 03/28/2022   ?No Known Allergies ?  ? ?  ?Medication List  ?  ? ?TAKE these medications   ? ?acetaminophen 325 MG tablet ?Commonly known as: Tylenol ?Take 2 tablets (650 mg total) by mouth every 6 (six) hours as needed (for pain scale < 4). ?  ?ibuprofen 100 MG/5ML suspension ?Commonly known as: ADVIL ?Take 30 mLs (600 mg total) by mouth every 6 (six) hours as needed. ?   ?NIFEdipine 30 MG 24 hr tablet ?Commonly known as: ADALAT CC ?Take 1 tablet (30 mg total) by mouth daily. ?Start taking on: March 29, 2022 ?  ? ?  ? ? ? ?Discharge home in stable condition ?Infant Feeding: Breast ?Infant Disposition:home with mother ?Discharge instruction: per After Visit Summary and Postpartum booklet. ?Activity: Advance as tolerated. Pelvic rest for 6 weeks.  ?Diet: low salt diet ?Anticipated Birth Control: Unsure ?Postpartum Appointment:6 weeks ?Additional Postpartum F/U: BP check 2-3 days ?Future Appointments:No future appointments. ?Follow up Visit: ? Follow-up Information   ? ? [facility] Follow up.   ?Why: 3-4 days for blood pressure check ?Contact information: ?[address] ?[place] [address] ?[phone] ? ? ?  ?  ? ?  ?  ? ?  ? ? ? ?  ? ?03/28/2022 ?Rowland Lathe, MD ? ? ?

## 2022-03-28 NOTE — Progress Notes (Signed)
Discharge instructions given, all questions and concerns answered, and patient verbalized understanding. RN discussed follow-up care, medications, care at home, HTN, and when to call the MD. Patient is alert and oriented, ambulatory, stable VS and pain.  ?

## 2022-03-28 NOTE — Care Management (Signed)
CM spoke to patient's mom Toniann Fail. Patient was sleeping. There was a consult for medication assistance. Toniann Fail denied any needs for medications needs. Confirmed that patient had insurance and did not have any issues paying co -pay and getting meds.  If patient discharges after 4:30 pm tonight please send medications to outside pharmacy.  ? ?Gretchen Short RNC-MNN, BSN ?Transitions of Care ?[facility] ? ?

## 2022-03-28 NOTE — Lactation Note (Signed)
This note was copied from a baby's chart. ?Lactation Consultation Note ? ?Patient Name: Girl Brittnee Gnau ?Today's Date: 03/28/2022 ?Reason for consult: Follow-up assessment;1st time breastfeeding;Primapara;Late-preterm 34-36.6wks;Infant < 6lbs;Other (Comment) ([place] and GHTN) ?Age:22 hours ? ?LC in to visit with P1 Mom of LPTI on day of possible discharge. Baby at 5.4% weight loss and having adequate output. Baby gained 74 gms from yesterday and taking her feedings well by paced bottle using the Nfant slow flow nipple.  Baby at  [place] consulted and feels baby is tolerating the flow well.  Baby took 30 ml for SLP in 15 mins.  ? ?Mom has been consistently double pumping, getting drops mainly with hand expression, but knows to continue with the double pumping.  Mom's hgb 10.4.  Mom feeling well.  Mom's BP is elevated and OB may keep Mom another day.  Mom started on Procardia. ? ?Mom will offer the breast with feeding cues, limiting baby to 15 mins or 30 mins if baby is actively breastfeeding and signs of milk transfer.  Parents are aware of importance of baby being supplemented at every feeding.  Volume of 30 ml guideline for third day of life.  ? ?Encouraged STS with baby as much as possible. ?Engorgement prevention and treatment reviewed. ? ?Mom has [place], [place] re-faxed referral for pump.  Mom's cousin is currently using a Symphony pump and offered for Mom to use this if unable to obtain a WIC on discharge. ? ?Mom will take baby to [facility] for care.  Mom is aware of IBCLC associated with Ped practice.  Mom also reminded of OP lactation support [facility] has and encouraged her to call prn. ? ? ?Lactation Tools Discussed/Used ?Tools: Pump;Flanges;Bottle ?Flange Size: 24 ?Breast pump type: Double-Electric Breast Pump;Manual ?Pump Education: Setup, frequency, and cleaning;Milk Storage ?Reason for Pumping: Support milk supply ?Pumping frequency: Mom states she is pumping every 3 hrs ?Pumped volume: 0 mL  (drops) ? ?Interventions ?Interventions: Skin to skin;Breast feeding basics reviewed;Hand express;Breast massage;DEBP;Hand pump;Expressed milk;Education;Pace feeding;LPT handout/interventions ? ?Discharge ?Discharge Education: Engorgement and breast care;Warning signs for feeding baby;Outpatient recommendation (Baby to go to [facility], to consult with IBCLC at [facility]) ?Pump: Manual (Mom will be able to borrow her cousin's [place] if unable to obtain her pump today on discharge) ?[place] Program: Yes ? ?Consult Status ?Consult Status: Follow-up ?Date: 03/29/22 ?Follow-up type: In-patient ? ? ? ?Tilda Burrow E ?03/28/2022, 12:24 PM ? ? ? ?

## 2022-03-28 NOTE — Progress Notes (Signed)
Post Partum Day 2 ?Subjective: ?Andrea Wilson is feeling okay today. She is reporting feeling slightly weak still but significantly improved from yesterday. She is ambulating with assistance. She is voiding and tolerating PO ? ?Objective: ?Patient Vitals for the past 24 hrs: ? BP Temp Temp src Pulse Resp SpO2  ?03/28/22 0903 (!) 132/96 98.4 ?F (36.9 ?C) Oral 80 17 100 %  ?03/28/22 0503 (!) 136/95 98.4 ?F (36.9 ?C) Oral 79 16 --  ?03/27/22 2347 125/78 98.4 ?F (36.9 ?C) Oral 80 16 100 %  ?03/27/22 1912 132/88 98.4 ?F (36.9 ?C) Oral 86 16 --  ?03/27/22 1730 123/87 -- -- (!) 123 -- 97 %  ?03/27/22 1725 125/88 -- -- (!) 105 -- 98 %  ?03/27/22 1721 125/83 98.7 ?F (37.1 ?C) Oral 91 17 98 %  ?03/27/22 1715 -- -- -- -- -- 97 %  ?03/27/22 1710 -- -- -- -- -- 98 %  ?03/27/22 1516 126/89 98.1 ?F (36.7 ?C) Oral 100 -- 98 %  ?03/27/22 1456 (!) 128/91 98.7 ?F (37.1 ?C) Oral (!) 104 16 98 %  ?03/27/22 1441 109/71 98.7 ?F (37.1 ?C) Oral 79 17 96 %  ?03/27/22 1253 119/82 98.6 ?F (37 ?C) Oral 100 17 98 %  ?03/27/22 1225 124/79 98.4 ?F (36.9 ?C) Oral 96 17 98 %  ?03/27/22 1115 127/81 98 ?F (36.7 ?C) Oral 98 18 98 %  ?03/27/22 1111 113/72 -- -- (!) 155 -- --  ?03/27/22 1110 -- -- -- -- -- 98 %  ?03/27/22 1109 131/88 -- -- (!) 127 -- --  ?03/27/22 1105 127/89 -- -- 99 -- --  ? ? ?Physical Exam:  ?General: alert, cooperative, and no distress ?Lochia: appropriate ?Uterine Fundus: firm ?DVT Evaluation: No evidence of DVT seen on physical exam. ? ?Recent Labs  ?  03/26/22 ?1233 03/26/22 ?1235 03/26/22 ?1830 03/27/22 ?0442 03/27/22 ?1121 03/27/22 ?2124  ?WBC 9.2  --  18.6* 15.6*  --  12.6*  ?HGB 8.4*  --  9.8* 8.8* 9.4* 10.4*  ?HCT 26.2*  --  28.5* 25.9* 26.9* 30.3*  ?PLT 210 209 165 177  --  179  ? ? ?Recent Labs  ?  03/25/22 ?1123  ?NA 140  ?K 3.5  ?CL 110  ?BUN 7  ?CREATININE 0.61  ?GLUCOSE 80  ?BILITOT 0.6  ?ALT 16  ?AST 26  ?ALKPHOS 250*  ?PROT 5.3*  ?ALBUMIN 2.6*  ? ? ?Recent Labs  ?  03/25/22 ?1123 03/26/22 ?0756  ?CALCIUM 8.1*  --   ?MG  --   6.1*  ? ? ?Recent Labs  ?  03/26/22 ?1235  ?APTT 25  ?INR 1.0  ? ? ?Recent Labs  ?  03/26/22 ?1235  ?APTT 25  ?INR 1.0  ?FIBRINOGEN 225  ? ?Assessment/Plan: ? ?Andrea Wilson 21 y.o. G1P0101 PPD#2 sp SVD ?1. PPC: routine PP care ?2. PPH of , s/p 4U PRBC, Hgb 10.4 ?3. Severe preeclampsia s/p 24 hour magnesium, blood pressures normal to mild range on Procardia Xl 30mg . Slightly higher this AM  (130/90s) ?4. Dispo: watch Bps this AM and consider discharge this afternoon with office BP follow up, patient somewhat hesitant for discharge today, will reevaluate this afternoon ? ? ? LOS: 3 days  ? ? ?03/28/2022, 11:03 AM  ? ?

## 2022-03-28 NOTE — Plan of Care (Signed)
Problem: Education: Goal: Knowledge of General Education information will improve Description: Including pain rating scale, medication(s)/side effects and non-pharmacologic comfort measures Outcome: Adequate for Discharge   Problem: Health Behavior/Discharge Planning: Goal: Ability to manage health-related needs will improve Outcome: Adequate for Discharge   Problem: Clinical Measurements: Goal: Ability to maintain clinical measurements within normal limits will improve Outcome: Adequate for Discharge Goal: Will remain free from infection Outcome: Adequate for Discharge Goal: Diagnostic test results will improve Outcome: Adequate for Discharge Goal: Respiratory complications will improve Outcome: Adequate for Discharge Goal: Cardiovascular complication will be avoided Outcome: Adequate for Discharge   Problem: Activity: Goal: Risk for activity intolerance will decrease Outcome: Adequate for Discharge   Problem: Nutrition: Goal: Adequate nutrition will be maintained Outcome: Adequate for Discharge   Problem: Coping: Goal: Level of anxiety will decrease Outcome: Adequate for Discharge   Problem: Elimination: Goal: Will not experience complications related to bowel motility Outcome: Adequate for Discharge Goal: Will not experience complications related to urinary retention Outcome: Adequate for Discharge   Problem: Pain Managment: Goal: General experience of comfort will improve Outcome: Adequate for Discharge   Problem: Safety: Goal: Ability to remain free from injury will improve Outcome: Adequate for Discharge   Problem: Skin Integrity: Goal: Risk for impaired skin integrity will decrease Outcome: Adequate for Discharge   Problem: Education: Goal: Knowledge of Childbirth will improve Outcome: Adequate for Discharge Goal: Ability to make informed decisions regarding treatment and plan of care will improve Outcome: Adequate for Discharge Goal: Ability to state  and carry out methods to decrease the pain will improve Outcome: Adequate for Discharge Goal: Individualized Educational Video(s) Outcome: Adequate for Discharge   Problem: Coping: Goal: Ability to verbalize concerns and feelings about labor and delivery will improve Outcome: Adequate for Discharge   Problem: Life Cycle: Goal: Ability to make normal progression through stages of labor will improve Outcome: Adequate for Discharge Goal: Ability to effectively push during vaginal delivery will improve Outcome: Adequate for Discharge   Problem: Role Relationship: Goal: Will demonstrate positive interactions with the child Outcome: Adequate for Discharge   Problem: Safety: Goal: Risk of complications during labor and delivery will decrease Outcome: Adequate for Discharge   Problem: Pain Management: Goal: Relief or control of pain from uterine contractions will improve Outcome: Adequate for Discharge   Problem: Education: Goal: Knowledge of disease or condition will improve Outcome: Adequate for Discharge Goal: Knowledge of the prescribed therapeutic regimen will improve Outcome: Adequate for Discharge   Problem: Fluid Volume: Goal: Peripheral tissue perfusion will improve Outcome: Adequate for Discharge   Problem: Clinical Measurements: Goal: Complications related to disease process, condition or treatment will be avoided or minimized Outcome: Adequate for Discharge   Problem: Education: Goal: Knowledge of condition will improve Outcome: Adequate for Discharge Goal: Individualized Educational Video(s) Outcome: Adequate for Discharge Goal: Individualized Newborn Educational Video(s) Outcome: Adequate for Discharge   Problem: Activity: Goal: Will verbalize the importance of balancing activity with adequate rest periods Outcome: Adequate for Discharge Goal: Ability to tolerate increased activity will improve Outcome: Adequate for Discharge   Problem: Coping: Goal:  Ability to identify and utilize available resources and services will improve Outcome: Adequate for Discharge   Problem: Life Cycle: Goal: Chance of risk for complications during the postpartum period will decrease Outcome: Adequate for Discharge   Problem: Role Relationship: Goal: Ability to demonstrate positive interaction with newborn will improve Outcome: Adequate for Discharge   Problem: Skin Integrity: Goal: Demonstration of wound healing without infection will improve   Outcome: Adequate for Discharge   

## 2022-03-29 LAB — SURGICAL PATHOLOGY

## 2022-04-05 ENCOUNTER — Telehealth (HOSPITAL_COMMUNITY): Payer: Self-pay | Admitting: *Deleted

## 2022-04-05 NOTE — Telephone Encounter (Signed)
Mom reports feeling good. No concerns about herself at this time. EPDS=0 [facility] score=0) ?Mom reports baby is doing well. Feeding, peeing, and pooping without difficulty. Safe sleep reviewed. Mom reports no concerns about baby at present. ? ?Odis Hollingshead, RN 04-05-2022 at 11:03am ?

## 2022-04-12 ENCOUNTER — Encounter (HOSPITAL_COMMUNITY): Payer: Self-pay | Admitting: Obstetrics and Gynecology

## 2022-04-12 ENCOUNTER — Other Ambulatory Visit: Payer: Self-pay

## 2022-04-12 NOTE — H&P (Signed)
Andrea Wilson is an 21 y.o. female presents for scheduled suction D&C for retained placental tissue following vaginal delivery. Pt delivered late preterm at 35 wks for severe preeclampsia on 03/26/22 CB.  Placenta removed and adherent. Pt stating has been bleeding like a menses with occasional small clots since delivery.   She reports changing a pad every 1-3 hours for the past week... HGB: 11.0 in office.  [place] of concern for RPOC's 3.4cm by 0.9cm.   Baby up to 5#10oz and doing well, breastfeeding. ? ?Pertinent Gynecological History: ? ?OB History: NSVD x 1  ? ?Menstrual History: ? ?No LMP recorded (lmp unknown). ?  ? ?Past Medical History:  ?Diagnosis Date  ? Allergy   ? Anxiety   ? Depression   ? Frequent headaches   ? Hypotension   ? ? ?Past Surgical History:  ?Procedure Laterality Date  ? KNEE SURGERY    ? TOOTH EXTRACTION    ? ? ?Family History  ?Problem Relation Age of Onset  ? Hyperlipidemia Mother   ? Anxiety disorder Mother   ? Depression Mother   ? Hypertension Father   ? Cancer Sister   ? Uterine cancer Maternal Grandmother   ? Lung cancer Paternal Grandfather   ? Migraines Other   ?     Mfhx of migraine  ? Seizures Cousin   ? Autism Cousin   ? ? ?Social History:  reports that she has never smoked. She has never used smokeless tobacco. She reports that she does not drink alcohol and does not use drugs. ? ?Allergies: No Known Allergies ? ?No medications prior to admission.  ? ? ?Review of Systems  ?Constitutional:  Negative for fever.  ?Gastrointestinal:  Negative for abdominal pain.  ?Genitourinary:  Positive for vaginal bleeding.  ? ?unknown if currently breastfeeding. ?Physical Exam ?Cardiovascular:  ?   Rate and Rhythm: Normal rate and regular rhythm.  ?Pulmonary:  ?   Effort: Pulmonary effort is normal.  ?Abdominal:  ?   Palpations: Abdomen is soft.  ?Genitourinary: ?   General: Normal vulva.  ?Neurological:  ?   General: No focal deficit present.  ?   Mental Status: She is alert.  ?Psychiatric:      ?   Mood and Affect: Mood normal.  ? ? ?No results found for this or any previous visit (from the past 24 hour(s)). ? ?No results found. ? ?Assessment/Plan: ?Pt s/p NSVD with adherent placenta requiring manual extraction and continued intermittent heavier bleeding, though not alarming.  [place] suggests 3cm by 1cm retained POC's.   ?d/w pt and partner in detail options for possible retained placental fragments including expectant management, cytotec, IPAS in office and suction D&E. She did not think she could tolerate anything but a D&C.  Reviewed risks of bleeding, infection and perforation. Pt desires to proceed and will pretreat with cytotec prior to procedure.   ? ?Andrea Wilson ?04/12/2022, 1:12 PM ? ?

## 2022-04-12 NOTE — Progress Notes (Signed)
Spoke with pt for pre-op call. Pt denies cardiac history. She 2.5 weeks postpartum. She had pre-eclampsia and is still having high blood pressure. Pt is still taking Procardia.  ? ?Shower instructions given to pt.  ?

## 2022-04-13 ENCOUNTER — Ambulatory Visit (HOSPITAL_COMMUNITY)
Admission: RE | Admit: 2022-04-13 | Discharge: 2022-04-13 | Disposition: A | Discharge status: Home / Self Care | Payer: 59 | Attending: Obstetrics and Gynecology | Admitting: Obstetrics and Gynecology

## 2022-04-13 ENCOUNTER — Encounter (HOSPITAL_COMMUNITY): Payer: Self-pay | Admitting: Obstetrics and Gynecology

## 2022-04-13 ENCOUNTER — Ambulatory Visit (HOSPITAL_COMMUNITY): Payer: 59 | Admitting: Certified Registered Nurse Anesthetist

## 2022-04-13 ENCOUNTER — Encounter (HOSPITAL_COMMUNITY)
Admission: RE | Disposition: A | Discharge status: Home / Self Care | Payer: Self-pay | Source: Home / Self Care | Attending: Obstetrics and Gynecology

## 2022-04-13 ENCOUNTER — Ambulatory Visit (HOSPITAL_BASED_OUTPATIENT_CLINIC_OR_DEPARTMENT_OTHER): Payer: 59 | Admitting: Certified Registered Nurse Anesthetist

## 2022-04-13 ENCOUNTER — Other Ambulatory Visit: Payer: Self-pay

## 2022-04-13 HISTORY — DX: Anemia, unspecified: D64.9

## 2022-04-13 HISTORY — PX: DILATION AND EVACUATION: SHX1459

## 2022-04-13 LAB — CBC
HCT: 36.7 % (ref 36.0–46.0)
Hemoglobin: 11.8 g/dL — ABNORMAL LOW (ref 12.0–15.0)
MCH: 28.8 pg (ref 26.0–34.0)
MCHC: 32.2 g/dL (ref 30.0–36.0)
MCV: 89.5 fL (ref 80.0–100.0)
Platelets: 303 10*3/uL (ref 150–400)
RBC: 4.1 MIL/uL (ref 3.87–5.11)
RDW: 13.4 % (ref 11.5–15.5)
WBC: 4.1 10*3/uL (ref 4.0–10.5)
nRBC: 0 % (ref 0.0–0.2)

## 2022-04-13 SURGERY — DILATION AND EVACUATION, UTERUS
Anesthesia: General | Site: Vagina

## 2022-04-13 MED ORDER — PROPOFOL 10 MG/ML IV BOLUS
INTRAVENOUS | Status: DC | PRN
  Administered 2022-04-13: 160 mg via INTRAVENOUS

## 2022-04-13 MED ORDER — FENTANYL CITRATE (PF) 250 MCG/5ML IJ SOLN
INTRAMUSCULAR | Status: AC
  Filled 2022-04-13: qty 5

## 2022-04-13 MED ORDER — ROCURONIUM BROMIDE 10 MG/ML (PF) SYRINGE
PREFILLED_SYRINGE | INTRAVENOUS | Status: DC | PRN
  Administered 2022-04-13: 50 mg via INTRAVENOUS

## 2022-04-13 MED ORDER — FENTANYL CITRATE (PF) 250 MCG/5ML IJ SOLN
INTRAMUSCULAR | Status: DC | PRN
Start: 2022-04-13 — End: 2022-04-13
  Administered 2022-04-13: 100 ug via INTRAVENOUS

## 2022-04-13 MED ORDER — ROCURONIUM BROMIDE 10 MG/ML (PF) SYRINGE
PREFILLED_SYRINGE | INTRAVENOUS | Status: AC
  Filled 2022-04-13: qty 20

## 2022-04-13 MED ORDER — MIDAZOLAM HCL 2 MG/2ML IJ SOLN
INTRAMUSCULAR | Status: AC
  Filled 2022-04-13: qty 2

## 2022-04-13 MED ORDER — FENTANYL CITRATE (PF) 100 MCG/2ML IJ SOLN: 25.0000 ug | INTRAMUSCULAR | Status: DC | PRN

## 2022-04-13 MED ORDER — LIDOCAINE HCL (PF) 1 % IJ SOLN
INTRAMUSCULAR | Status: AC
  Filled 2022-04-13: qty 30

## 2022-04-13 MED ORDER — TRANEXAMIC ACID-NACL 1000-0.7 MG/100ML-% IV SOLN
INTRAVENOUS | Status: DC | PRN
  Administered 2022-04-13: 1000 mg via INTRAVENOUS

## 2022-04-13 MED ORDER — ONDANSETRON HCL 4 MG/2ML IJ SOLN
INTRAMUSCULAR | Status: DC | PRN
  Administered 2022-04-13: 4 mg via INTRAVENOUS

## 2022-04-13 MED ORDER — CHLORHEXIDINE GLUCONATE 0.12 % MT SOLN
15.0000 mL | Freq: Once | OROMUCOSAL | Status: AC
  Administered 2022-04-13: 15 mL via OROMUCOSAL
  Filled 2022-04-13: qty 15

## 2022-04-13 MED ORDER — DEXAMETHASONE SODIUM PHOSPHATE 10 MG/ML IJ SOLN
INTRAMUSCULAR | Status: AC
  Filled 2022-04-13: qty 1

## 2022-04-13 MED ORDER — LIDOCAINE HCL 1 % IJ SOLN
INTRAMUSCULAR | Status: DC | PRN
  Administered 2022-04-13: 20 mL

## 2022-04-13 MED ORDER — DEXAMETHASONE SODIUM PHOSPHATE 10 MG/ML IJ SOLN
INTRAMUSCULAR | Status: DC | PRN
Start: 2022-04-13 — End: 2022-04-13
  Administered 2022-04-13: 5 mg via INTRAVENOUS

## 2022-04-13 MED ORDER — 0.9 % SODIUM CHLORIDE (POUR BTL) OPTIME
TOPICAL | Status: DC | PRN
  Administered 2022-04-13: 1000 mL

## 2022-04-13 MED ORDER — ORAL CARE MOUTH RINSE: 15.0000 mL | Freq: Once | OROMUCOSAL | Status: AC

## 2022-04-13 MED ORDER — PHENYLEPHRINE 80 MCG/ML (10ML) SYRINGE FOR IV PUSH (FOR BLOOD PRESSURE SUPPORT)
PREFILLED_SYRINGE | INTRAVENOUS | Status: AC
  Filled 2022-04-13: qty 10

## 2022-04-13 MED ORDER — POVIDONE-IODINE 10 % EX SWAB
2.0000 "application " | Freq: Once | CUTANEOUS | Status: AC
  Administered 2022-04-13: 2 via TOPICAL

## 2022-04-13 MED ORDER — SUGAMMADEX SODIUM 200 MG/2ML IV SOLN
INTRAVENOUS | Status: DC | PRN
Start: 2022-04-13 — End: 2022-04-13
  Administered 2022-04-13 (×2): 100 mg via INTRAVENOUS

## 2022-04-13 MED ORDER — LACTATED RINGERS IV SOLN: INTRAVENOUS | Status: DC

## 2022-04-13 MED ORDER — LIDOCAINE 2% (20 MG/ML) 5 ML SYRINGE
INTRAMUSCULAR | Status: AC
  Filled 2022-04-13: qty 5

## 2022-04-13 MED ORDER — SUGAMMADEX SODIUM 500 MG/5ML IV SOLN
INTRAVENOUS | Status: AC
  Filled 2022-04-13: qty 5

## 2022-04-13 MED ORDER — PHENYLEPHRINE 80 MCG/ML (10ML) SYRINGE FOR IV PUSH (FOR BLOOD PRESSURE SUPPORT)
PREFILLED_SYRINGE | INTRAVENOUS | Status: DC | PRN
  Administered 2022-04-13: 80 ug via INTRAVENOUS

## 2022-04-13 MED ORDER — ONDANSETRON HCL 4 MG/2ML IJ SOLN
INTRAMUSCULAR | Status: AC
  Filled 2022-04-13: qty 2

## 2022-04-13 MED ORDER — METRONIDAZOLE 500 MG PO TABS: 500.0000 mg | ORAL_TABLET | Freq: Two times a day (BID) | ORAL | 0 refills | Status: AC

## 2022-04-13 MED ORDER — TRANEXAMIC ACID-NACL 1000-0.7 MG/100ML-% IV SOLN
INTRAVENOUS | Status: AC
  Filled 2022-04-13: qty 100

## 2022-04-13 MED ORDER — MIDAZOLAM HCL 2 MG/2ML IJ SOLN
INTRAMUSCULAR | Status: DC | PRN
  Administered 2022-04-13 (×2): 1 mg via INTRAVENOUS

## 2022-04-13 MED ORDER — LIDOCAINE 2% (20 MG/ML) 5 ML SYRINGE
INTRAMUSCULAR | Status: DC | PRN
  Administered 2022-04-13: 60 mg via INTRAVENOUS

## 2022-04-13 SURGICAL SUPPLY — 24 items
CATH ROBINSON RED A/P 16FR (CATHETERS) ×3 IMPLANT
DECANTER SPIKE VIAL GLASS SM (MISCELLANEOUS) ×3 IMPLANT
FILTER UTR ASPR ASSEMBLY (MISCELLANEOUS) ×3 IMPLANT
GAUZE 4X4 16PLY ~~LOC~~+RFID DBL (SPONGE) ×1 IMPLANT
GLOVE BIO SURGEON STRL SZ 6.5 (GLOVE) ×3 IMPLANT
GLOVE BIOGEL PI IND STRL 6.5 (GLOVE) ×2 IMPLANT
GLOVE BIOGEL PI IND STRL 7.0 (GLOVE) ×2 IMPLANT
GLOVE BIOGEL PI INDICATOR 6.5 (GLOVE) ×1
GLOVE BIOGEL PI INDICATOR 7.0 (GLOVE) ×1
GOWN STRL REUS W/ TWL LRG LVL3 (GOWN DISPOSABLE) ×4 IMPLANT
GOWN STRL REUS W/TWL LRG LVL3 (GOWN DISPOSABLE) ×4
HOSE CONNECTING 18IN BERKELEY (TUBING) ×3 IMPLANT
KIT BERKELEY 1ST TRI 3/8 NO TR (MISCELLANEOUS) ×3 IMPLANT
KIT BERKELEY 1ST TRIMESTER 3/8 (MISCELLANEOUS) ×3 IMPLANT
NS IRRIG 1000ML POUR BTL (IV SOLUTION) ×3 IMPLANT
PACK VAGINAL MINOR WOMEN LF (CUSTOM PROCEDURE TRAY) ×3 IMPLANT
PAD OB MATERNITY 4.3X12.25 (PERSONAL CARE ITEMS) ×3 IMPLANT
SET BERKELEY SUCTION TUBING (SUCTIONS) ×3 IMPLANT
TOWEL GREEN STERILE FF (TOWEL DISPOSABLE) ×6 IMPLANT
VACURETTE 10 RIGID CVD (CANNULA) IMPLANT
VACURETTE 7MM CVD STRL WRAP (CANNULA) IMPLANT
VACURETTE 8 RIGID CVD (CANNULA) IMPLANT
VACURETTE 8MM F TIP (MISCELLANEOUS) ×1 IMPLANT
VACURETTE 9 RIGID CVD (CANNULA) IMPLANT

## 2022-04-13 NOTE — Anesthesia Postprocedure Evaluation (Signed)
Anesthesia Post Note ? ?Patient: Andrea Wilson ? ?Procedure(s) Performed: DILATATION AND EVACUATION (Vagina ) ? ?  ? ?Patient location during evaluation: PACU ?Anesthesia Type: General ?Level of consciousness: awake ?Pain management: pain level controlled ?Vital Signs Assessment: post-procedure vital signs reviewed and stable ?Respiratory status: spontaneous breathing ?Cardiovascular status: stable ?Postop Assessment: no apparent nausea or vomiting ?Anesthetic complications: no ? ? ?No notable events documented. ? ?Last Vitals:  ?Vitals:  ? 04/13/22 1226 04/13/22 1240  ?BP: 111/79 108/79  ?Pulse: 70 68  ?Resp: 14 14  ?Temp:  36.4 ?C  ?SpO2: 95% 96%  ?  ?Last Pain:  ?Vitals:  ? 04/13/22 1240  ?PainSc: 0-No pain  ? ? ?  ?  ?  ?  ?  ?  ? ?Diala Waxman ? ? ? ? ?

## 2022-04-13 NOTE — Anesthesia Preprocedure Evaluation (Signed)
Anesthesia Evaluation  ?Patient identified by MRN, date of birth, ID band ?Patient awake ? ? ? ?Reviewed: ?Allergy & Precautions, NPO status , Patient's Chart, lab work & pertinent test results ? ?Airway ?Mallampati: II ? ?TM Distance: >3 FB ? ? ? ? Dental ?  ?Pulmonary ? ?  ?breath sounds clear to auscultation ? ? ? ? ? ? Cardiovascular ?hypertension,  ?Rhythm:Regular Rate:Normal ? ? ?  ?Neuro/Psych ? Headaches, PSYCHIATRIC DISORDERS   ? GI/Hepatic ?negative GI ROS, Neg liver ROS,   ?Endo/Other  ? ? Renal/GU ?negative Renal ROS  ? ?  ?Musculoskeletal ? ? Abdominal ?  ?Peds ? Hematology ?  ?Anesthesia Other Findings ? ? Reproductive/Obstetrics ? ?  ? ? ? ? ? ? ? ? ? ? ? ? ? ?  ?  ? ? ? ? ? ? ? ? ?Anesthesia Physical ?Anesthesia Plan ? ?ASA: 1 ? ?Anesthesia Plan: General  ? ?Post-op Pain Management:   ? ?Induction:  ? ?PONV Risk Score and Plan: 3 ? ?Airway Management Planned: Oral ETT ? ?Additional Equipment:  ? ?Intra-op Plan:  ? ?Post-operative Plan: Extubation in OR ? ?Informed Consent: I have reviewed the patients History and Physical, chart, labs and discussed the procedure including the risks, benefits and alternatives for the proposed anesthesia with the patient or authorized representative who has indicated his/her understanding and acceptance.  ? ? ? ? ? ?Plan Discussed with: CRNA and Anesthesiologist ? ?Anesthesia Plan Comments:   ? ? ? ? ? ? ?Anesthesia Quick Evaluation ? ?

## 2022-04-13 NOTE — Anesthesia Procedure Notes (Signed)
Procedure Name: Intubation ?Date/Time: 04/13/2022 11:08 AM ?Performed by: Janene Harvey, CRNA ?Pre-anesthesia Checklist: Patient identified, Emergency Drugs available, Suction available and Patient being monitored ?Patient Re-evaluated:Patient Re-evaluated prior to induction ?Oxygen Delivery Method: Circle system utilized ?Preoxygenation: Pre-oxygenation with 100% oxygen ?Induction Type: IV induction ?Ventilation: Mask ventilation without difficulty ?Laryngoscope Size: Mac and 4 ?Grade View: Grade I ?Tube type: Oral ?Tube size: 7.0 mm ?Number of attempts: 1 ?Airway Equipment and Method: Stylet and Oral airway ?Placement Confirmation: ETT inserted through vocal cords under direct vision, positive ETCO2 and breath sounds checked- equal and bilateral ?Secured at: 22 cm ?Tube secured with: Tape ?Dental Injury: Teeth and Oropharynx as per pre-operative assessment  ? ? ? ? ?

## 2022-04-13 NOTE — Transfer of Care (Signed)
Immediate Anesthesia Transfer of Care Note ? ?Patient: Andrea Wilson ? ?Procedure(s) Performed: DILATATION AND EVACUATION (Vagina ) ? ?Patient Location: PACU ? ?Anesthesia Type:General ? ?Level of Consciousness: drowsy and patient cooperative ? ?Airway & Oxygen Therapy: Patient Spontanous Breathing and Patient connected to face mask oxygen ? ?Post-op Assessment: Report given to RN and Post -op Vital signs reviewed and stable ? ?Post vital signs: Reviewed and stable ? ?Last Vitals:  ?Vitals Value Taken Time  ?BP 133/97 04/13/22 1155  ?Temp    ?Pulse 109 04/13/22 1158  ?Resp 14 04/13/22 1158  ?SpO2 95 % 04/13/22 1158  ?Vitals shown include unvalidated device data. ? ?Last Pain:  ?Vitals:  ? 04/13/22 0931  ?PainSc: 6   ?   ? ?Patients Stated Pain Goal: 0 (04/13/22 0931) ? ?Complications: No notable events documented. ?

## 2022-04-13 NOTE — Op Note (Signed)
Operative Note ? ? ? ?Preoperative Diagnosis ?S/p NSVD and manual extraction of adherent placenta ?[place] suggestive of retained POC's ? ?Postoperative Diagnosis ?Same ? ?Procedure ?Suction dilation and curettage ? ?Surgeon ?Huel Cote, MD ? ?Anesthesia ?GETA ? ?Fluids: ?EBL ?UOP 33mL straight cath prior to procedure ?IVF LR ? ?Findings ?Healing perineal and cervical laceration. Uterus sounded to 8-9cm.  8 mm suction curette introduced and small amount of tissue obtained in multiple passes. Sharp curettage performed with possible tissue on anterior wall noted. Two additional passes performed and no further tissue forthcoming.  Uterus had moderate bleeding just with instrumentation so pt given TXA and bleeding minimal at conclusion of procedure. ? ?Specimen ?Uterine curettings ? ?Procedure Note  ?Pt was taken to operating room where GETA anesthesia was obtained without difficulty.  She was prepped and draped in the normal sterile fashion in the dorsal lithotomy position.  An appropriate timeout was performed.  A speculum was placed in the vagina and 2cc of 1% plain lidocaine injected into the anterior cervix.  An additional 9cc was placed both at 2 and 10 o'clock for a paracervical block.  A small ring forcep was placed on the anterior lip of the cervix. The cervix was slightly dilated from the pt's cytotec treatment and recent delivery. The sound was  introduced into the uterus and the uterus sounded to about 8cm.  The 42mm suction currette was obtained and introduced into the fundus.  With several passes, a small amount of tissue was obtained.  When no further tissue was noted, the suction was discontinued and a gentle currettage performed.  Two additional passes revealed no further tissue, and the procedure was completed.  The forcep was removed and TXA given for hemostasis. All instruments and sponges were removed from vagina and the patient awakened and taken to the PACU in good condition.  ?

## 2022-04-13 NOTE — Interval H&P Note (Signed)
History and Physical Interval Note: ? ?04/13/2022 ?10:35 AM ? ?Andrea Wilson  has presented today for surgery, with the diagnosis of retained products of Conception.  The various methods of treatment have been discussed with the patient and family. After consideration of risks, benefits and other options for treatment, the patient has consented to  Procedure(s) with comments: ?DILATATION AND EVACUATION (N/A) - LMA Anesthesia as a surgical intervention.  The patient's history has been reviewed, patient examined, no change in status, stable for surgery.  I have reviewed the patient's chart and labs.  Questions were answered to the patient's satisfaction.   ? ? ?Oliver Pila ? ? ?

## 2022-04-14 ENCOUNTER — Encounter (HOSPITAL_COMMUNITY): Payer: Self-pay | Admitting: Obstetrics and Gynecology

## 2022-04-14 LAB — SURGICAL PATHOLOGY

## 2022-05-10 LAB — HM PAP SMEAR

## 2022-06-14 ENCOUNTER — Encounter: Payer: Self-pay | Admitting: Primary Care

## 2022-06-14 ENCOUNTER — Ambulatory Visit (INDEPENDENT_AMBULATORY_CARE_PROVIDER_SITE_OTHER): Payer: 59 | Admitting: Primary Care

## 2022-06-14 DIAGNOSIS — F32A Depression, unspecified: Secondary | ICD-10-CM | POA: Diagnosis not present

## 2022-06-14 DIAGNOSIS — G43009 Migraine without aura, not intractable, without status migrainosus: Secondary | ICD-10-CM | POA: Diagnosis not present

## 2022-06-14 DIAGNOSIS — F419 Anxiety disorder, unspecified: Secondary | ICD-10-CM

## 2022-06-14 DIAGNOSIS — R519 Headache, unspecified: Secondary | ICD-10-CM

## 2022-06-14 DIAGNOSIS — G8929 Other chronic pain: Secondary | ICD-10-CM

## 2022-06-14 NOTE — Progress Notes (Signed)
Subjective:    Patient ID: Andrea Wilson, female    DOB: [birth year], 21 y.o.   MRN: [id number]  HPI  Andrea Wilson is a very pleasant 21 y.o. female with a history of migraines, anxiety and depression who presents today to for follow up .  1) Migraines: Chronic history, previously managed on propranolol for migraine prevention and sumatriptan for migraine abortion. She is taking neither medications now as she has had no migraines or recent headaches. She is post partum as of two months ago.  2) Anxiety and Depression: Currently managed on venlafaxine ER 150 mg daily. She was managed on venlafaxine ER 150 mg during her entire pregnancy. She is not breast feeding now.  Two evenings ago she had a "mental breakdown". Her mother encouraged her to move out so she immediately became angry and scared. She then began crying hysterically, became short of breath. Her mother gave her some hydroxyzine which helped to calm her down, she then went to sleep.   Today she's feeling better. Overall she feels well managed on venlafaxine. She uses her hydroxyzine sparingly.   BP Readings from Last 3 Encounters:  06/14/22 98/60  04/13/22 108/79  03/28/22 (!) 134/99     Review of Systems  Eyes:  Negative for visual disturbance.  Respiratory:  Negative for shortness of breath.   Cardiovascular:  Negative for chest pain.  Neurological:  Negative for dizziness and headaches.  Psychiatric/Behavioral:  The patient is not nervous/anxious.          Past Medical History:  Diagnosis Date   Allergy    Anemia    during pregnancy   Anxiety    Axillary pain, left 06/16/2020   Chest pain 08/23/2018   COVID 12/2020   and October 2021  both mild cases   Depression    Fatigue 08/03/2021   Frequent headaches    Hypotension    Postpartum hemorrhage 03/26/2022   Sensation of foreign body in throat 08/17/2021   SVD (spontaneous vaginal delivery) 03/26/2022    Social History   Socioeconomic History    Marital status: Single    Spouse name: Not on file   Number of children: Not on file   Years of education: Not on file   Highest education level: Not on file  Occupational History   Not on file  Tobacco Use   Smoking status: Never   Smokeless tobacco: Never  Vaping Use   Vaping Use: Never used  Substance and Sexual Activity   Alcohol use: No   Drug use: No   Sexual activity: Not Currently  Other Topics Concern   Not on file  Social History Narrative   Lives with mother and brother. Her sister passed away in 04/08/13 at age 60 from cancer.   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    Social Determinants of Health   Financial Resource Strain: Not on file  Food Insecurity: Not on file  Transportation Needs: Not on file  Physical Activity: Not on file  Stress: Not on file  Social Connections: Not on file  Intimate Partner Violence: Not on file    Past Surgical History:  Procedure Laterality Date   DILATION AND EVACUATION  04/13/2022   Procedure: DILATATION AND EVACUATION;  Surgeon: Huel Cote, MD;  Location: [facility] OR;  Service: Gynecology;;  LMA Anesthesia   KNEE SURGERY     TOOTH EXTRACTION      Family History  Problem Relation Age of Onset   Hyperlipidemia Mother  Anxiety disorder Mother    Depression Mother    Hypertension Father    Cancer Sister    Uterine cancer Maternal Grandmother    Lung cancer Paternal Grandfather    Migraines Other        Mfhx of migraine   Seizures Cousin    Autism Cousin     No Known Allergies  Current Outpatient Medications on File Prior to Visit  Medication Sig Dispense Refill   acetaminophen (TYLENOL) 325 MG tablet Take 2 tablets (650 mg total) by mouth every 6 (six) hours as needed (for pain scale < 4).     hydrOXYzine (ATARAX) 25 MG tablet Take 25 mg by mouth daily as needed.     ibuprofen (ADVIL) 100 MG/5ML suspension Take 30 mLs (600 mg total) by mouth every 6 (six) hours as needed. 946 mL 3   venlafaxine XR (EFFEXOR-XR) 150 MG  24 hr capsule Take 150 mg by mouth at bedtime.     No current facility-administered medications on file prior to visit.    BP 98/60   Pulse 87   Temp 97.6 F (36.4 C) (Temporal)   Ht 5\' 5"  (1.651 m)   Wt 127 lb (57.6 kg)   LMP  (LMP Unknown)   SpO2 97%   Breastfeeding No   BMI 21.13 kg/m  Objective:   Physical Exam Cardiovascular:     Rate and Rhythm: Normal rate and regular rhythm.  Pulmonary:     Effort: Pulmonary effort is normal.     Breath sounds: Normal breath sounds.  Musculoskeletal:     Cervical back: Neck supple.  Skin:    General: Skin is warm and dry.  Psychiatric:        Mood and Affect: Mood normal.           Assessment & Plan:   Problem List Items Addressed This Visit       Cardiovascular and Mediastinum   Migraine without aura and without status migrainosus, not intractable    Controlled.  Continue off of treatment. Continue to closely monitor.        Other   Anxiety and depression    Overall controlled.  Continue venlafaxine ER 150 mg daily. Continue hydroxyzine 25 mg as needed. She is not breast-feeding.  Continue to monitor.       Relevant Medications   hydrOXYzine (ATARAX) 25 MG tablet   Chronic headaches    Controlled.  Continue off of propanolol and sumatriptan. Continue to monitor.          , NP

## 2022-06-14 NOTE — Assessment & Plan Note (Signed)
Overall controlled.  Continue venlafaxine ER 150 mg daily. Continue hydroxyzine 25 mg as needed. She is not breast-feeding.  Continue to monitor.

## 2022-06-14 NOTE — Assessment & Plan Note (Signed)
Controlled.  Continue off of treatment. Continue to closely monitor.

## 2022-06-14 NOTE — Assessment & Plan Note (Signed)
Controlled.  Continue off of propanolol and sumatriptan. Continue to monitor.

## 2022-06-14 NOTE — Patient Instructions (Signed)
Notify me when you need refills of your medication.  It was a pleasure to see you today!

## 2022-07-21 ENCOUNTER — Ambulatory Visit (INDEPENDENT_AMBULATORY_CARE_PROVIDER_SITE_OTHER): Payer: 59 | Admitting: Primary Care

## 2022-07-21 ENCOUNTER — Encounter: Payer: Self-pay | Admitting: Primary Care

## 2022-07-21 VITALS — BP 110/68 | HR 74 | Temp 98.4°F | Ht 65.0 in | Wt 131.0 lb

## 2022-07-21 DIAGNOSIS — R21 Rash and other nonspecific skin eruption: Secondary | ICD-10-CM | POA: Diagnosis not present

## 2022-07-21 MED ORDER — METHYLPREDNISOLONE ACETATE 80 MG/ML IJ SUSP
80.0000 mg | Freq: Once | INTRAMUSCULAR | Status: AC
  Administered 2022-07-21: 80 mg via INTRAMUSCULAR

## 2022-07-21 MED ORDER — PREDNISONE 20 MG PO TABS: ORAL_TABLET | ORAL | 0 refills | Status: DC

## 2022-07-21 NOTE — Progress Notes (Signed)
Subjective:    Patient ID: Andrea Wilson, female    DOB: 05/28/2001, 21 y.o.   MRN: [id number]  HPI  Andrea Wilson is a very pleasant 21 y.o. female with a history of headaches, chronic cystitis, preeclampsia who presents today to discuss rash.  Her rash began one week ago which initially began to the bilateral lower back/posterior hips. Since then she's noticed a rash to the bilateral dorsal feet and between toes. Prior to symptom onset she was in the woods trying to save a kitten. She suspects she was exposed to poison oak, didn't see poison oak. Her boyfriend sustained the same rash.   Her rash is itchy mostly. Her rash is somewhat improved in some places but she continues to notice moderate itching.  She's tried calamine lotion and oatmeal baths without resolve.     Review of Systems  Respiratory:  Negative for shortness of breath.   Cardiovascular:  Negative for chest pain.  Skin:  Positive for rash. Negative for color change.         Past Medical History:  Diagnosis Date   Allergy    Anemia    during pregnancy   Anxiety    Axillary pain, left 06/16/2020   Chest pain 08/23/2018   COVID 12/2020   and October 2021  both mild cases   Depression    Fatigue 08/03/2021   Frequent headaches    Hypotension    Postpartum hemorrhage 03/26/2022   Sensation of foreign body in throat 08/17/2021   SVD (spontaneous vaginal delivery) 03/26/2022    Social History   Socioeconomic History   Marital status: Single    Spouse name: Not on file   Number of children: Not on file   Years of education: Not on file   Highest education level: Not on file  Occupational History   Not on file  Tobacco Use   Smoking status: Never   Smokeless tobacco: Never  Vaping Use   Vaping Use: Never used  Substance and Sexual Activity   Alcohol use: No   Drug use: No   Sexual activity: Not Currently  Other Topics Concern   Not on file  Social History Narrative   Lives with mother and  brother. Her sister passed away in 03/14/13 at age 7 from cancer.   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    Social Determinants of Health   Financial Resource Strain: Not on file  Food Insecurity: Not on file  Transportation Needs: Not on file  Physical Activity: Not on file  Stress: Not on file  Social Connections: Not on file  Intimate Partner Violence: Not on file    Past Surgical History:  Procedure Laterality Date   DILATION AND EVACUATION  04/13/2022   Procedure: DILATATION AND EVACUATION;  Surgeon: Huel Cote, MD;  Location: [facility] OR;  Service: Gynecology;;  LMA Anesthesia   KNEE SURGERY     TOOTH EXTRACTION      Family History  Problem Relation Age of Onset   Hyperlipidemia Mother    Anxiety disorder Mother    Depression Mother    Hypertension Father    Cancer Sister    Uterine cancer Maternal Grandmother    Lung cancer Paternal Grandfather    Migraines Other        Mfhx of migraine   Seizures Cousin    Autism Cousin     No Known Allergies  Current Outpatient Medications on File Prior to Visit  Medication Sig Dispense  Refill   acetaminophen (TYLENOL) 325 MG tablet Take 2 tablets (650 mg total) by mouth every 6 (six) hours as needed (for pain scale < 4).     hydrOXYzine (ATARAX) 25 MG tablet Take 25 mg by mouth daily as needed.     ibuprofen (ADVIL) 100 MG/5ML suspension Take 30 mLs (600 mg total) by mouth every 6 (six) hours as needed. 946 mL 3   venlafaxine XR (EFFEXOR-XR) 150 MG 24 hr capsule Take 150 mg by mouth at bedtime.     No current facility-administered medications on file prior to visit.    BP 110/68   Pulse 74   Temp 98.4 F (36.9 C) (Oral)   Ht 5\' 5"  (1.651 m)   Wt 131 lb (59.4 kg)   SpO2 98%   BMI 21.80 kg/m  Objective:   Physical Exam Constitutional:      General: She is not in acute distress. Pulmonary:     Effort: Pulmonary effort is normal.  Skin:    General: Skin is warm and dry.     Findings: Rash present.     Comments:  Moderate rash with erythema and few vesicles to bilateral lower back/lateral hips. Few vesicles noted to bilateral dorsal feet and in between left 1st and 2nd toes.  Neurological:     Mental Status: She is alert.           Assessment & Plan:   Problem List Items Addressed This Visit       Musculoskeletal and Integument   Rash and nonspecific skin eruption - Primary    Consistent for poison ivy/oak dermatitis.  Will give Methylprednisolone 80 IM today in the office.   Will given Prednisone 20 mg taper. 40 mg x 4 days then 20 mg x 4 days. Start tomorrow.  Discussed using OTC antihistamine at bedtime for itching.   Can continue oatmeal baths and calamine lotion for relief.   I evaluated patient, was consulted regarding treatment, and agree with assessment and plan per , RN, DNP student.   Modesto Charon, NP-C   Will continue to monitor.      Relevant Medications   predniSONE (DELTASONE) 20 MG tablet       Mayra Reel, NP

## 2022-07-21 NOTE — Progress Notes (Signed)
Established Patient Office Visit  Subjective   Patient ID: Andrea Wilson, female    DOB: [birth year]  Age: 21 y.o. MRN: [id number]  Chief Complaint  Patient presents with   [facility]    X 1 week over the counter meds not helping     HPI  [name] is a 21 year old female with past medical history of anxiety, depression and chronic headaches presents today for a rash. She said that she was in the woods about a week ago rescuing a kitten when she feels that she was exposed to poison oak. Two other family members were also exposed and have gotten better but her's continues to get worsen. She noticed that her rash initially on her lower back, bilateral. Now it has gotten worse on her left side and has spread to her feet, bilateral. The rash is itchy with no pain or drainage. She has tried oatmeal baths and calamine lotion with relief for only 30 minutes.    Patient Active Problem List   Diagnosis Date Noted   Severe preeclampsia, third trimester 03/25/2022   Pelvic cramping 08/17/2021   Chronic cystitis 09/13/2019   Abnormal thyroid function test 05/22/2019   Rash and nonspecific skin eruption 04/04/2019   Hemorrhoid 02/11/2019   Screening examination for STD (sexually transmitted disease) 05/08/2017   Migraine without aura and without status migrainosus, not intractable 11/08/2016   Chronic headaches 10/13/2016   Anxiety and depression 04/30/2015   Past Medical History:  Diagnosis Date   Allergy    Anemia    during pregnancy   Anxiety    Axillary pain, left 06/16/2020   Chest pain 08/23/2018   COVID 12/2020   and October 2021  both mild cases   Depression    Fatigue 08/03/2021   Frequent headaches    Hypotension    Postpartum hemorrhage 03/26/2022   Sensation of foreign body in throat 08/17/2021   SVD (spontaneous vaginal delivery) 03/26/2022   Past Surgical History:  Procedure Laterality Date   DILATION AND EVACUATION  04/13/2022   Procedure: DILATATION AND EVACUATION;   Surgeon: Huel Cote, MD;  Location: [facility] OR;  Service: Gynecology;;  LMA Anesthesia   KNEE SURGERY     TOOTH EXTRACTION     Social History   Tobacco Use   Smoking status: Never   Smokeless tobacco: Never  Vaping Use   Vaping Use: Never used  Substance Use Topics   Alcohol use: No   Drug use: No      Review of Systems  Constitutional:  Negative for chills and fever.  Eyes:  Negative for blurred vision.  Cardiovascular:  Negative for chest pain.  Skin:  Positive for itching and rash.       Lower back, dorsal feet and fingers.  Neurological:  Negative for headaches.  Psychiatric/Behavioral:  The patient is not nervous/anxious.       Objective:     BP 110/68   Pulse 74   Temp 98.4 F (36.9 C) (Oral)   Ht 5\' 5"  (1.651 m)   Wt 131 lb (59.4 kg)   SpO2 98%   BMI 21.80 kg/m  BP Readings from Last 3 Encounters:  07/21/22 110/68  06/14/22 98/60  04/13/22 108/79      Physical Exam Vitals and nursing note reviewed.  Constitutional:      Appearance: Normal appearance.  Cardiovascular:     Rate and Rhythm: Normal rate and regular rhythm.     Pulses: Normal pulses.  Heart sounds: Normal heart sounds.  Pulmonary:     Effort: Pulmonary effort is normal.     Breath sounds: Normal breath sounds.  Skin:    Findings: Rash present.     Comments: Lower back, dorsal surface of her feet (bilaterally) and in between her fingers of both hands.  Neurological:     Mental Status: She is alert and oriented to person, place, and time.  Psychiatric:        Mood and Affect: Mood normal.      No results found for any visits on 07/21/22.   The ASCVD Risk score (Arnett DK, et al., 2019) failed to calculate for the following reasons:   The 2019 ASCVD risk score is only valid for ages 26 to 82    Assessment & Plan:   Problem List Items Addressed This Visit       Musculoskeletal and Integument   Rash and nonspecific skin eruption - Primary    Rash present on lower  back, worse on left side. Flat, purple, purpura. No rash on feet but just a few purpura on dorsal surface of both feet.  No rash on hand but 1-2 flat purpura in between her fingers on both hands   No drainage, wound or insect bite.   Will give Methylprednisolone 80 IM today in the office.   Will given Prednisone 20 mg taper. 40 mg x 4 days then 20 mg x 4 days.  Discussed using OTC antihistamine at bedtime for itching.   Can continue oatmeal baths and calamine lotion for relief.   Will continue to monitor.      Relevant Medications   predniSONE (DELTASONE) 20 MG tablet    No follow-ups on file.    Modesto Charon, BSN-RN, DNP STUDENT

## 2022-07-21 NOTE — Assessment & Plan Note (Addendum)
Consistent for poison ivy/oak dermatitis.  Will give Methylprednisolone 80 IM today in the office.   Will given Prednisone 20 mg taper. 40 mg x 4 days then 20 mg x 4 days. Start tomorrow.  Discussed using OTC antihistamine at bedtime for itching.   Can continue oatmeal baths and calamine lotion for relief.   I evaluated patient, was consulted regarding treatment, and agree with assessment and plan per Modesto Charon, RN, DNP student.   Andrea Reel, NP-C   Will continue to monitor.

## 2022-07-21 NOTE — Patient Instructions (Signed)
The injection we are giving you should kick in about 45 minutes to an hour.   Start Prednisone 20 mg tomorrow.Take 2 tablets by mouth once daily for four days, then one tablet daily for four days.  You can take OTC antihistamine at bedtime for itching.   You may continue the oatmeal bath for comfort measure.   It was a pleasure to see you today!

## 2022-07-22 ENCOUNTER — Ambulatory Visit: Payer: 59 | Admitting: Primary Care

## 2022-08-01 ENCOUNTER — Encounter: Payer: Self-pay | Admitting: Primary Care

## 2022-08-01 ENCOUNTER — Ambulatory Visit (INDEPENDENT_AMBULATORY_CARE_PROVIDER_SITE_OTHER): Payer: 59 | Admitting: Primary Care

## 2022-08-01 DIAGNOSIS — R051 Acute cough: Secondary | ICD-10-CM | POA: Insufficient documentation

## 2022-08-01 MED ORDER — AZITHROMYCIN 250 MG PO TABS
ORAL_TABLET | ORAL | 0 refills | Status: DC
Start: 2022-08-01 — End: 2022-08-17

## 2022-08-01 MED ORDER — BENZONATATE 200 MG PO CAPS: 200.0000 mg | ORAL_CAPSULE | Freq: Three times a day (TID) | ORAL | 0 refills | Status: DC | PRN

## 2022-08-01 NOTE — Assessment & Plan Note (Signed)
Given duration of symptoms, coupled with presentation today, will treat for presumed bacterial involvement.  Rx for azithromycin 250 mg course sent to pharmacy.  Rx for Tessalon Perles 200 mg TID PRN sent to pharmacy.  Discussed home care instructions. Follow up PRN.

## 2022-08-01 NOTE — Patient Instructions (Signed)
Start Azithromycin antibiotics for infection. Take 2 tablets by mouth today, then 1 tablet daily for 4 additional days.  You may take Benzonatate capsules for cough. Take 1 capsule by mouth three times daily as needed for cough.  It was a pleasure to see you today!  

## 2022-08-01 NOTE — Progress Notes (Signed)
Subjective:    Patient ID: Andrea Wilson, female    DOB: 2001/07/02, 21 y.o.   MRN: [id number]  Cough Associated symptoms include headaches and a sore throat. Pertinent negatives include no chest pain, chills, ear pain, fever or shortness of breath.    Andrea Wilson is a very pleasant 21 y.o. female with a history of migraines, chronic headaches, anxiety depression who presents today to discuss cough.  Symptom onset 11 days ago with scratchy throat. She then developed a dry cough which has transitioned into a wet cough. She's also noticed a "horrible taste in the back of my throat", headaches, chest congestion. Overall she's feeling more tired. No improvement.   She denies fevers, chills, body aches, esophageal burning, belching, history of asthma. Her daughter has had some congestion recently. She took a Covid-19 test four days ago which was negative. She's been taking Mucinex OTC without improvement.   BP Readings from Last 3 Encounters:  08/01/22 110/62  07/21/22 110/68  06/14/22 98/60      Review of Systems  Constitutional:  Positive for fatigue. Negative for chills and fever.  HENT:  Positive for congestion and sore throat. Negative for ear pain, sinus pressure and sinus pain.   Respiratory:  Positive for cough. Negative for shortness of breath.   Cardiovascular:  Negative for chest pain.  Neurological:  Positive for headaches.         Past Medical History:  Diagnosis Date   Allergy    Anemia    during pregnancy   Anxiety    Axillary pain, left 06/16/2020   Chest pain 08/23/2018   COVID 12/2020   and October 2021  both mild cases   Depression    Fatigue 08/03/2021   Frequent headaches    Hypotension    Postpartum hemorrhage 03/26/2022   Sensation of foreign body in throat 08/17/2021   SVD (spontaneous vaginal delivery) 03/26/2022    Social History   Socioeconomic History   Marital status: Single    Spouse name: Not on file   Number of children: Not on  file   Years of education: Not on file   Highest education level: Not on file  Occupational History   Not on file  Tobacco Use   Smoking status: Never   Smokeless tobacco: Never  Vaping Use   Vaping Use: Never used  Substance and Sexual Activity   Alcohol use: No   Drug use: No   Sexual activity: Not Currently  Other Topics Concern   Not on file  Social History Narrative   Lives with mother and brother. Her sister passed away in 2013/03/19 at age 8 from cancer.   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    Social Determinants of Health   Financial Resource Strain: Not on file  Food Insecurity: Not on file  Transportation Needs: Not on file  Physical Activity: Not on file  Stress: Not on file  Social Connections: Not on file  Intimate Partner Violence: Not on file    Past Surgical History:  Procedure Laterality Date   DILATION AND EVACUATION  04/13/2022   Procedure: DILATATION AND EVACUATION;  Surgeon: Huel Cote, MD;  Location: [facility] OR;  Service: Gynecology;;  LMA Anesthesia   KNEE SURGERY     TOOTH EXTRACTION      Family History  Problem Relation Age of Onset   Hyperlipidemia Mother    Anxiety disorder Mother    Depression Mother    Hypertension Father  Cancer Sister    Uterine cancer Maternal Grandmother    Lung cancer Paternal Grandfather    Migraines Other        Mfhx of migraine   Seizures Cousin    Autism Cousin     No Known Allergies  Current Outpatient Medications on File Prior to Visit  Medication Sig Dispense Refill   acetaminophen (TYLENOL) 325 MG tablet Take 2 tablets (650 mg total) by mouth every 6 (six) hours as needed (for pain scale < 4).     hydrOXYzine (ATARAX) 25 MG tablet Take 25 mg by mouth daily as needed.     ibuprofen (ADVIL) 100 MG/5ML suspension Take 30 mLs (600 mg total) by mouth every 6 (six) hours as needed. 946 mL 3   venlafaxine XR (EFFEXOR-XR) 150 MG 24 hr capsule Take 150 mg by mouth at bedtime.     No current  facility-administered medications on file prior to visit.    BP 110/62   Pulse (!) 103   Temp 98.6 F (37 C) (Oral)   Ht 5\' 5"  (1.651 m)   Wt 131 lb (59.4 kg)   SpO2 97%   BMI 21.80 kg/m  Objective:   Physical Exam Constitutional:      Appearance: She is ill-appearing.  HENT:     Right Ear: Tympanic membrane and ear canal normal.     Left Ear: Tympanic membrane and ear canal normal.     Nose:     Right Sinus: No maxillary sinus tenderness or frontal sinus tenderness.     Left Sinus: No maxillary sinus tenderness or frontal sinus tenderness.     Mouth/Throat:     Pharynx: No posterior oropharyngeal erythema.  Eyes:     Conjunctiva/sclera: Conjunctivae normal.  Cardiovascular:     Rate and Rhythm: Normal rate and regular rhythm.  Pulmonary:     Effort: Pulmonary effort is normal.     Breath sounds: Normal breath sounds. No wheezing or rales.  Musculoskeletal:     Cervical back: Neck supple.  Lymphadenopathy:     Cervical: No cervical adenopathy.  Skin:    General: Skin is warm and dry.           Assessment & Plan:   Problem List Items Addressed This Visit       Other   Acute cough    Given duration of symptoms, coupled with presentation today, will treat for presumed bacterial involvement.  Rx for azithromycin 250 mg course sent to pharmacy.  Rx for Tessalon Perles 200 mg TID PRN sent to pharmacy.  Discussed home care instructions. Follow up PRN.        Relevant Medications   azithromycin (ZITHROMAX) 250 MG tablet   benzonatate (TESSALON) 200 MG capsule       , NP

## 2022-08-02 ENCOUNTER — Ambulatory Visit
Admission: RE | Admit: 2022-08-02 | Discharge: 2022-08-02 | Disposition: A | Discharge status: Home / Self Care | Payer: 59 | Source: Ambulatory Visit | Attending: Nurse Practitioner | Admitting: Nurse Practitioner

## 2022-08-02 ENCOUNTER — Telehealth: Payer: Self-pay | Admitting: Primary Care

## 2022-08-02 VITALS — BP 116/84 | HR 114 | Temp 98.3°F | Resp 18

## 2022-08-02 DIAGNOSIS — Z20822 Contact with and (suspected) exposure to covid-19: Secondary | ICD-10-CM | POA: Diagnosis present

## 2022-08-02 DIAGNOSIS — J069 Acute upper respiratory infection, unspecified: Secondary | ICD-10-CM | POA: Diagnosis present

## 2022-08-02 NOTE — Telephone Encounter (Signed)
See phone note have spoke to patient.

## 2022-08-02 NOTE — Telephone Encounter (Signed)
Called and spoke to patient. Reviewed information. She will retest. She will let us know if any changes. Patient also sent my chart that boyfriend and daughter have both started having symptoms. He is having new headache and daughter is having loose stools. Advised patient that they soul reach out to their primary care for advise.

## 2022-08-02 NOTE — Telephone Encounter (Signed)
Spoke to patient by telephone and was advised that she does not think that she has a fever but has no way of checking her temperature. Patient stated that her throat is real sore today and she is very weak. Patient stated that she started her medication yesterday and took it with food.  Patient stated that she started with a bad headache, diarrhea and nausea during the night. Patient stated that the headache is better but still having diarrhea and nausea. Patient stated that she has a bad productive cough that is green/yellow in color. Patient stated that she is urinating without any problems. Patient stated that she is achy and feels much worse today. Pharmacy [facility]

## 2022-08-02 NOTE — Telephone Encounter (Signed)
She needs to continue the antibiotic treatment that was initiated yesterday for her symptoms.  It typically takes 2 to 3 days for her to notice significant improvement.  I also recommend she retest for COVID.

## 2022-08-02 NOTE — ED Provider Notes (Signed)
[facility]    CSN: [id number] Arrival date & time: 08/02/22  1458      History   Chief Complaint Chief Complaint  Patient presents with   Cough    Need to be tested for Covid and flu - Entered by patient    HPI Andrea Wilson is a 21 y.o. female.   Patient presents with cough for the past 12 days.  She also endorses sore throat, body aches, headache, abdominal pain, and diarrhea that began over the past 24 hours.  Reports her boyfriend and 15-month-old baby are having similar symptoms.  Denies known fever at home.  She is coughing up mucus.  Denies chest pain, shortness of breath, wheezing.  Is having little bit of nasal congestion and postnasal drainage.  Denies ear pain or drainage, nausea and vomiting.  Her appetite has been decreased for the past couple of days.  Also reports that she is more tired than normal.  She has been taking azithromycin and Tessalon Perles for her symptoms.  Reports prior to this, she went through 2 boxes of Mucinex without much relief.    Past Medical History:  Diagnosis Date   Allergy    Anemia    during pregnancy   Anxiety    Axillary pain, left 06/16/2020   Chest pain 08/23/2018   COVID 12/2020   and October 2021  both mild cases   Depression    Fatigue 08/03/2021   Frequent headaches    Hypotension    Postpartum hemorrhage 03/26/2022   Sensation of foreign body in throat 08/17/2021   SVD (spontaneous vaginal delivery) 03/26/2022    Patient Active Problem List   Diagnosis Date Noted   Acute cough 08/01/2022   Severe preeclampsia, third trimester 03/25/2022   Pelvic cramping 08/17/2021   Chronic cystitis 09/13/2019   Abnormal thyroid function test 05/22/2019   Rash and nonspecific skin eruption 04/04/2019   Hemorrhoid 02/11/2019   Screening examination for STD (sexually transmitted disease) 05/08/2017   Migraine without aura and without status migrainosus, not intractable 11/08/2016   Chronic headaches 10/13/2016   Anxiety  and depression 04/30/2015    Past Surgical History:  Procedure Laterality Date   DILATION AND EVACUATION  04/13/2022   Procedure: DILATATION AND EVACUATION;  Surgeon: Huel Cote, MD;  Location: [facility] OR;  Service: Gynecology;;  LMA Anesthesia   KNEE SURGERY     TOOTH EXTRACTION      OB History     Gravida  1   Para  1   Term      Preterm  1   AB      Living  1      SAB      IAB      Ectopic      Multiple  0   Live Births  1            Home Medications    Prior to Admission medications   Medication Sig Start Date End Date Taking? Authorizing Provider  acetaminophen (TYLENOL) 325 MG tablet Take 2 tablets (650 mg total) by mouth every 6 (six) hours as needed (for pain scale < 4). 03/28/22   Charlett Nose, MD  azithromycin (ZITHROMAX) 250 MG tablet Take 2 tablets by mouth today, then 1 tablet daily for 4 additional days. 08/01/22   Doreene Nest, NP  benzonatate (TESSALON) 200 MG capsule Take 1 capsule (200 mg total) by mouth 3 (three) times daily as needed for cough. 08/01/22  Doreene Nest, NP  hydrOXYzine (ATARAX) 25 MG tablet Take 25 mg by mouth daily as needed.    [provider]  ibuprofen (ADVIL) 100 MG/5ML suspension Take 30 mLs (600 mg total) by mouth every 6 (six) hours as needed. 03/28/22   Charlett Nose, MD  venlafaxine XR (EFFEXOR-XR) 150 MG 24 hr capsule Take 150 mg by mouth at bedtime.    [provider]    Family History Family History  Problem Relation Age of Onset   Hyperlipidemia Mother    Anxiety disorder Mother    Depression Mother    Hypertension Father    Cancer Sister    Uterine cancer Maternal Grandmother    Lung cancer Paternal Grandfather    Migraines Other        Mfhx of migraine   Seizures Cousin    Autism Cousin     Social History Social History   Tobacco Use   Smoking status: Never   Smokeless tobacco: Never  Vaping Use   Vaping Use: Never used  Substance Use Topics    Alcohol use: No   Drug use: No     Allergies   Patient has no known allergies.   Review of Systems Review of Systems Per HPI  Physical Exam Triage Vital Signs ED Triage Vitals  Enc Vitals Group     BP 08/02/22 1504 116/84     Pulse Rate 08/02/22 1504 (!) 114     Resp 08/02/22 1504 18     Temp 08/02/22 1504 98.3 F (36.8 C)     Temp Source 08/02/22 1504 Oral     SpO2 08/02/22 1504 94 %     Weight --      Height --      Head Circumference --      Peak Flow --      Pain Score 08/02/22 1506 6     Pain Loc --      Pain Edu? --      Excl. in GC? --    No data found.  Updated Vital Signs BP 116/84 (BP Location: Right Arm)   Pulse (!) 114   Temp 98.3 F (36.8 C) (Oral)   Resp 18   SpO2 94%   Visual Acuity Right Eye Distance:   Left Eye Distance:   Bilateral Distance:    Right Eye Near:   Left Eye Near:    Bilateral Near:     Physical Exam Vitals and nursing note reviewed.  Constitutional:      General: She is not in acute distress.    Appearance: Normal appearance. She is not toxic-appearing.  HENT:     Head: Normocephalic and atraumatic.     Right Ear: Tympanic membrane, ear canal and external ear normal. There is no impacted cerumen.     Left Ear: Tympanic membrane, ear canal and external ear normal. There is no impacted cerumen.     Nose: Congestion and rhinorrhea present.     Mouth/Throat:     Mouth: Mucous membranes are moist.     Pharynx: Oropharynx is clear. Posterior oropharyngeal erythema present.     Comments: Cobblestoning of posterior pharynx Eyes:     General: No scleral icterus.    Extraocular Movements: Extraocular movements intact.  Cardiovascular:     Rate and Rhythm: Normal rate and regular rhythm.  Pulmonary:     Effort: Pulmonary effort is normal. No respiratory distress.     Breath sounds: Normal breath sounds. No wheezing,  rhonchi or rales.  Abdominal:     General: Abdomen is flat. Bowel sounds are normal. There is no  distension.     Palpations: Abdomen is soft.     Tenderness: There is no abdominal tenderness. There is no right CVA tenderness, left CVA tenderness or guarding.  Musculoskeletal:     Cervical back: Normal range of motion.  Lymphadenopathy:     Cervical: No cervical adenopathy.  Skin:    General: Skin is warm and dry.     Capillary Refill: Capillary refill takes less than 2 seconds.     Coloration: Skin is not jaundiced or pale.     Findings: No erythema or rash.  Neurological:     Mental Status: She is alert and oriented to person, place, and time.  Psychiatric:        Behavior: Behavior is cooperative.      UC Treatments / Results  Labs (all labs ordered are listed, but only abnormal results are displayed) Labs Reviewed  SARS CORONAVIRUS 2 (TAT 6-24 HRS)    EKG   Radiology No results found.  Procedures Procedures (including critical care time)  Medications Ordered in UC Medications - No data to display  Initial Impression / Assessment and Plan / UC Course  I have reviewed the triage vital signs and the nursing notes.  Pertinent labs & imaging results that were available during my care of the patient were reviewed by me and considered in my medical decision making (see chart for details).    Patient is a very pleasant, well-appearing 21 year old female presenting for cough, sore throat, body aches, and abdominal pain with diarrhea.  In triage, she is normotensive, afebrile, not tachypneic, and oxygenating well on room air.  She is mildly tachycardic.  COVID-19 testing obtained.  Supportive care discussed.  ER precautions also discussed.  Recommended following up with primary care provider if symptoms persist or worsen despite treatment.  The patient was given the opportunity to ask questions.  All questions answered to their satisfaction.  The patient is in agreement to this plan.  Final Clinical Impressions(s) / UC Diagnoses   Final diagnoses:  Exposure to COVID-19  virus  URI with cough and congestion     Discharge Instructions      Your symptoms and exam findings are most consistent with an upper respiratory infection. These usually run their course in about 10 days.  If your symptoms last longer than 10 days without improvement, please follow up with your primary care provider.  If your symptoms, worsen, please go to the Emergency Room.    We have tested you today for COVID-19 and influenza.  You will see the results in Mychart and we will call you with positive results.    Please stay home and isolate until you are aware of the results.    Some things that can make you feel better are: - Increased rest - Increasing fluid with water/sugar free electrolytes - Acetaminophen and ibuprofen as needed for fever/pain.  - Salt water gargling, chloraseptic spray and throat lozenges - OTC guaifenesin (Mucinex) 600 mg twice daily.  - Saline sinus flushes or a neti pot.  - Humidifying the air.     ED Prescriptions   None    PDMP not reviewed this encounter.   Eulogio Bear, NP 08/02/22 [phone]

## 2022-08-02 NOTE — Telephone Encounter (Signed)
Patient called and stated she feels that she has gotten worse, she feels weak and stomach is hurting, throat is sore and scratchy and also she has took her first dose of medication. Call back number [phone].

## 2022-08-02 NOTE — Discharge Instructions (Addendum)
Your symptoms and exam findings are most consistent with an upper respiratory infection. These usually run their course in about 10 days.  If your symptoms last longer than 10 days without improvement, please follow up with your primary care provider.  If your symptoms, worsen, please go to the Emergency Room.    We have tested you today for COVID-19 and influenza.  You will see the results in Mychart and we will call you with positive results.    Please stay home and isolate until you are aware of the results.    Some things that can make you feel better are: - Increased rest - Increasing fluid with water/sugar free electrolytes - Acetaminophen and ibuprofen as needed for fever/pain.  - Salt water gargling, chloraseptic spray and throat lozenges - OTC guaifenesin (Mucinex) 600 mg twice daily.  - Saline sinus flushes or a neti pot.  - Humidifying the air.

## 2022-08-02 NOTE — ED Triage Notes (Signed)
Cough x 1.5 weeks.  Was seen by PCP on Monday and was given a z-pack and tessalon perles.  Stomach pain started last night with diarrhea.  Sore throat this morning with body aches and headache.  States boyfriend is having similar symptoms.  States she feels weak.

## 2022-08-02 NOTE — Telephone Encounter (Signed)
Left a message on voice mail to call back.

## 2022-08-03 ENCOUNTER — Encounter: Payer: Self-pay | Admitting: Primary Care

## 2022-08-03 LAB — SARS CORONAVIRUS 2 (TAT 6-24 HRS): SARS Coronavirus 2: NEGATIVE

## 2022-08-05 ENCOUNTER — Telehealth (HOSPITAL_COMMUNITY): Payer: Self-pay | Admitting: Emergency Medicine

## 2022-08-05 NOTE — Telephone Encounter (Signed)
Patient left a voicemail looking for the results of the flu test from her recent visit.  Did not see one ordered.  Reviewed with patient and offered a recollect, she states she is feeling better and will pass at this time

## 2022-08-15 ENCOUNTER — Other Ambulatory Visit: Payer: Self-pay | Admitting: Primary Care

## 2022-08-15 DIAGNOSIS — F419 Anxiety disorder, unspecified: Secondary | ICD-10-CM

## 2022-08-17 ENCOUNTER — Encounter: Payer: Self-pay | Admitting: Family Medicine

## 2022-08-17 ENCOUNTER — Telehealth (INDEPENDENT_AMBULATORY_CARE_PROVIDER_SITE_OTHER): Payer: 59 | Admitting: Family Medicine

## 2022-08-17 VITALS — BP 117/68 | HR 98

## 2022-08-17 DIAGNOSIS — R051 Acute cough: Secondary | ICD-10-CM

## 2022-08-17 DIAGNOSIS — J040 Acute laryngitis: Secondary | ICD-10-CM

## 2022-08-17 MED ORDER — BENZONATATE 200 MG PO CAPS: 200.0000 mg | ORAL_CAPSULE | Freq: Three times a day (TID) | ORAL | 0 refills | Status: DC | PRN

## 2022-08-17 NOTE — Telephone Encounter (Signed)
See visit from today

## 2022-08-17 NOTE — Telephone Encounter (Signed)
Spoke to patient by telephone and was advised that she started with symptoms yesterday. Patient is going to do a covid test and let us know the results. Patient may need a virtual visit or in office visit.

## 2022-08-17 NOTE — Telephone Encounter (Signed)
Spoke to patient and a virtual visit scheduled with Dr. Selena Batten today 08/17/22.

## 2022-08-17 NOTE — Progress Notes (Signed)
I connected with [name] on 08/17/22 at 11:20 AM EDT by video and verified that I am speaking with the correct person using two identifiers.   I discussed the limitations, risks, security and privacy concerns of performing an evaluation and management service by video and the availability of in person appointments. I also discussed with the patient that there may be a patient responsible charge related to this service. The patient expressed understanding and agreed to proceed.  Patient location: Home Provider Location: [facility] Participants: Lynnda Child and [name]   Subjective:     BONI MACLELLAN is a 21 y.o. female presenting for Cough (X 1 day. Negative covid test today. Burnard Bunting congestion )     Cough This is a new problem. The current episode started yesterday. The problem has been gradually worsening. The cough is Productive of sputum. Associated symptoms include nasal congestion, postnasal drip and a sore throat. Pertinent negatives include no chills, ear pain, fever, headaches, shortness of breath or wheezing. Treatments tried: tylenol cold and flu.   Has not tried honey   Was sick 2 weeks ago 81 month old got RSV  Has some chest soreness - since cold last week with coughing    Review of Systems  Constitutional:  Negative for chills and fever.  HENT:  Positive for postnasal drip and sore throat. Negative for ear pain.   Respiratory:  Positive for cough. Negative for shortness of breath and wheezing.   Neurological:  Negative for headaches.     Social History   Tobacco Use  Smoking Status Never  Smokeless Tobacco Never        Objective:   BP Readings from Last 3 Encounters:  08/17/22 117/68  08/02/22 116/84  08/01/22 110/62   Wt Readings from Last 3 Encounters:  08/01/22 131 lb (59.4 kg)  07/21/22 131 lb (59.4 kg)  06/14/22 127 lb (57.6 kg)   BP 117/68   Pulse 98   Breastfeeding No   Physical  Exam Constitutional:      Appearance: Normal appearance. She is not ill-appearing.  HENT:     Head: Normocephalic and atraumatic.     Right Ear: External ear normal.     Left Ear: External ear normal.  Eyes:     Conjunctiva/sclera: Conjunctivae normal.  Pulmonary:     Effort: Pulmonary effort is normal. No respiratory distress.  Neurological:     Mental Status: She is alert. Mental status is at baseline.  Psychiatric:        Mood and Affect: Mood normal.        Behavior: Behavior normal.        Thought Content: Thought content normal.        Judgment: Judgment normal.            Assessment & Plan:   Problem List Items Addressed This Visit       Other   Acute cough   Relevant Medications   benzonatate (TESSALON) 200 MG capsule   Other Visit Diagnoses     Acute viral laryngitis    -  Primary   Relevant Medications   benzonatate (TESSALON) 200 MG capsule      Suspect secondary to RSV given contact with 34-month-old child.  Discussed symptomatic care.  Voice rest.  Honey and cough drops as well as ibuprofen for sore throat.  With chest discomfort advised ibuprofen if no improvement or worsening ER precautions and follow-up.  Tessalon for  cough.  Return if symptoms worsen or fail to improve.  Lynnda Child, MD

## 2022-08-17 NOTE — Patient Instructions (Addendum)
Ibuprofen 800 mg every 8 hours   Based on your symptoms, it looks like you have a virus.   Antibiotics are not need for a viral infection but the following will help:   Drink plenty of fluids Get lots of rest  Sinus Congestion 1) Neti Pot (Saline rinse) -- 2 times day -- if tolerated 2) Flonase (Store Brand ok) - once daily 3) Over the counter congestion medications  Cough 1) Cough drops can be helpful 2) Nyquil (or nighttime cough medication) 3) Honey is proven to be one of the best cough medications  4) Cough medicine with Dextromethorphan can also be helpful  Sore Throat 1) Honey as above, cough drops 2) Ibuprofen or Aleve can be helpful 3) Salt water Gargles  If you develop fevers (Temperature >100.4), chills, worsening symptoms or symptoms lasting longer than 10 days return to clinic.

## 2022-09-05 NOTE — Telephone Encounter (Signed)
Spoke to patient by telephone and was advised Friday she had diarrhea that morning. Patient stated later she was having some abdominal pain. Patient stated that she had a bowel movement with lots of blood in it. Patient stated that she has not had any more blood in stools since that one time. Patient stated that she had some cramps this morning. Patient scheduled for an appointment with Allie Bossier NP 09/06/22 at 3:20 pm. Patient was given ER precautions and she verbalized understanding.

## 2022-09-06 ENCOUNTER — Ambulatory Visit: Payer: 59 | Admitting: Primary Care

## 2022-09-06 NOTE — Telephone Encounter (Signed)
Noted.  Will evaluate as scheduled. 

## 2022-09-14 NOTE — Telephone Encounter (Signed)
Patient reports only sweating excessively when sleeping, either at night or taking a nap, not throughout the day. She says when she wakes up she can physically see the sweat droplets on her legs. She does not have any other symptoms. This has been going on for 3-4 days.  She states she keeps it cold where she sleeps and fans on, so it is not warm at all in the house.  Do we need to schedule office visit? Please advise

## 2022-09-14 NOTE — Telephone Encounter (Signed)
Yes, needs evaluation.  Okay to add on for 09/15/22 in the afternoon.

## 2022-09-15 ENCOUNTER — Ambulatory Visit (INDEPENDENT_AMBULATORY_CARE_PROVIDER_SITE_OTHER): Payer: 59 | Admitting: Primary Care

## 2022-09-15 ENCOUNTER — Encounter: Payer: Self-pay | Admitting: Primary Care

## 2022-09-15 VITALS — BP 116/74 | HR 86 | Temp 98.4°F | Ht 65.0 in | Wt 128.0 lb

## 2022-09-15 DIAGNOSIS — R61 Generalized hyperhidrosis: Secondary | ICD-10-CM | POA: Diagnosis not present

## 2022-09-15 DIAGNOSIS — G8929 Other chronic pain: Secondary | ICD-10-CM | POA: Diagnosis not present

## 2022-09-15 DIAGNOSIS — R519 Headache, unspecified: Secondary | ICD-10-CM | POA: Diagnosis not present

## 2022-09-15 MED ORDER — PROPRANOLOL HCL 20 MG PO TABS: 20.0000 mg | ORAL_TABLET | Freq: Every day | ORAL | 2 refills | Status: DC

## 2022-09-15 NOTE — Progress Notes (Signed)
Established Patient Office Visit  Subjective   Patient ID: Andrea Wilson, female    DOB: 09-27-2001  Age: 21 y.o. MRN: [id number]  Chief Complaint  Patient presents with   Night Sweats    X 3-4 days. Sweating excessively when sleeping, either at night or taking a nap.     HPI  Andrea Wilson is a 21 year old female with past medical history of migraines, chronic headaches, anxiety, depression, who presents today to discuss night sweats.   She has a history of night sweats for about a year. It has gotten worse for the last 3-4 days. She wakes up cold and her clothes and her sheets are wet. She does co-sleep with the baby. She is using two fans and with the air conditioner running. She has chills when she wakes up regardless of how long she has slept. She has noticed a decrease in appetite and fatigue. Her baby is 17 months old. She does not feel hungry. Denies any abnormal weight loss.   She does not remember her last menstrual cycle. She does have a copper IUD. She did take two pregnancy tests at home and was concerned that one might have been positive. Would like to have the blood test to rule out pregnancy.  She has a history of anxiety and takes venlafaxine XR daily without any adverse effects.   She has a history of chronic headaches and was taking Propanolol prior to her pregnancy. She has noticed that she is starting to have a headache daily and would like to start her propanolol again.   Patient Active Problem List   Diagnosis Date Noted   Excessive sweating 09/15/2022   Acute cough 08/01/2022   Severe preeclampsia, third trimester 03/25/2022   Pelvic cramping 08/17/2021   Chronic cystitis 09/13/2019   Abnormal thyroid function test 05/22/2019   Rash and nonspecific skin eruption 04/04/2019   Hemorrhoid 02/11/2019   Screening examination for STD (sexually transmitted disease) 05/08/2017   Migraine without aura and without status migrainosus, not intractable 11/08/2016    Chronic headaches 10/13/2016   Anxiety and depression 04/30/2015   Past Medical History:  Diagnosis Date   Allergy    Anemia    during pregnancy   Anxiety    Axillary pain, left 06/16/2020   Chest pain 08/23/2018   COVID 12/2020   and October 2021  both mild cases   Depression    Fatigue 08/03/2021   Frequent headaches    Hypotension    Postpartum hemorrhage 03/26/2022   Sensation of foreign body in throat 08/17/2021   SVD (spontaneous vaginal delivery) 03/26/2022   Past Surgical History:  Procedure Laterality Date   DILATION AND EVACUATION  04/13/2022   Procedure: DILATATION AND EVACUATION;  Surgeon: Huel Cote, MD;  Location: [facility] OR;  Service: Gynecology;;  LMA Anesthesia   KNEE SURGERY     TOOTH EXTRACTION     Social History   Tobacco Use   Smoking status: Never   Smokeless tobacco: Never  Vaping Use   Vaping Use: Never used  Substance Use Topics   Alcohol use: No   Drug use: No   Family History  Problem Relation Age of Onset   Hyperlipidemia Mother    Anxiety disorder Mother    Depression Mother    Hypertension Father    Cancer Sister    Uterine cancer Maternal Grandmother    Lung cancer Paternal Grandfather    Migraines Other  Mfhx of migraine   Seizures Cousin    Autism Cousin    No Known Allergies  Review of Systems  Constitutional:  Positive for chills, diaphoresis and malaise/fatigue. Negative for weight loss.  Eyes:  Negative for blurred vision and double vision.  Respiratory:  Negative for cough and shortness of breath.   Cardiovascular:  Negative for chest pain and palpitations.  Gastrointestinal:  Negative for abdominal pain, constipation, diarrhea, nausea and vomiting.  Neurological:  Negative for dizziness, tremors, weakness and headaches.  Psychiatric/Behavioral:  Negative for depression. The patient is not nervous/anxious.       Objective:     BP 116/74   Pulse 86   Temp 98.4 F (36.9 C) (Temporal)   Ht 5\' 5"  (1.651 m)    Wt 128 lb (58.1 kg)   SpO2 95%   BMI 21.30 kg/m  BP Readings from Last 3 Encounters:  09/15/22 116/74  08/17/22 117/68  08/02/22 116/84   Wt Readings from Last 3 Encounters:  09/15/22 128 lb (58.1 kg)  08/01/22 131 lb (59.4 kg)  07/21/22 131 lb (59.4 kg)      Physical Exam Vitals reviewed.  Constitutional:      Appearance: She is normal weight.  Cardiovascular:     Rate and Rhythm: Normal rate and regular rhythm.     Pulses: Normal pulses.     Heart sounds: Normal heart sounds.  Pulmonary:     Effort: Pulmonary effort is normal.     Breath sounds: Normal breath sounds.  Neurological:     Mental Status: She is alert and oriented to person, place, and time.      No results found for any visits on 09/15/22.     The ASCVD Risk score (Arnett DK, et al., 2019) failed to calculate for the following reasons:   The 2019 ASCVD risk score is only valid for ages 64 to 22    Assessment & Plan:   Problem List Items Addressed This Visit       Other   Chronic headaches    Uncontrolled. Will rule out pregnancy prior to starting medication.  Agree with patient restart Propanolol 20 mg at bedtime daily.   HCG quantitative pregnancy pending.       Relevant Medications   propranolol (INDERAL) 20 MG tablet   Excessive sweating - Primary    Unclear etiology.   Suspect it could be a side effect of venlafaxine.   Will check CBC, CMP, TSH, Free T4 to rule out metabolic causes.       Relevant Orders   CBC with Differential   Comprehensive metabolic panel   T4, free   TSH   Beta hCG quant (ref lab)    No follow-ups on file.    Tinnie Gens, RN

## 2022-09-15 NOTE — Telephone Encounter (Signed)
Left message to return call to our office.  

## 2022-09-15 NOTE — Assessment & Plan Note (Addendum)
No alarm signs.  Suspect it could be side effectd of venlafaxine as nothing else has change. Doubt hormonal cause as she is not breast feeding.  Reviewed HIV lab which was negative in 2022 Reviewed pap smear from June 2023 which was normal.  Will check CBC, CMP, TSH, Free T4, HCG pregnancy to rule out metabolic causes.   Discussed venlafaxine being the probable cause, especially if labs are unremarkable. She would like to continue venlafaxine ER 150 for now, consider dose decrease if warranted.   I evaluated patient, was consulted regarding treatment, and agree with assessment and plan per Tinnie Gens, RN, DNP student.   Allie Bossier, NP-C

## 2022-09-15 NOTE — Assessment & Plan Note (Addendum)
Returned and daily. Will rule out pregnancy prior to starting medication.  Agree to restart Propanolol 20 mg at bedtime daily.  Rx sent to pharmacy.  HCG quantitative pregnancy pending.   I evaluated patient, was consulted regarding treatment, and agree with assessment and plan per Tinnie Gens, RN, DNP student.   Allie Bossier, NP-C

## 2022-09-15 NOTE — Telephone Encounter (Signed)
Scheduled patient for 10/12 @ 3:20

## 2022-09-15 NOTE — Progress Notes (Signed)
Subjective:    Patient ID: Andrea Wilson, female    DOB: [birth year], 21 y.o.   MRN: [id number]  HPI  Andrea Wilson is a very pleasant 21 y.o. female with a history of migraines, frequent headaches, GAD, depression with a history of who presents today to discuss excessive sweating.  Chronic for the last year, worse over the last 2 to 3 days.  She will wake from sleep in the morning and after naps either sweaty or completely drenched in sweat.  At times her sheets are wet from her sweating.  She has noticed an overall decrease in appetite.  She does have a history of abnormal TSH.  She underwent Pap smear in June 2023 which was negative.  She denies abdominal pain, dizziness, chest pain, shortness of breath, new supplements, new medications, changes in her diet, increased stress.  She has taken 2 home pregnancy test, both of which were negative.  Over the last month she has noticed a gradual return in her frequent headaches for which she had prior to her pregnancy.  She was once managed on propanolol 20 mg daily for headache prevention and did well.  She feels that she might need to resume.  Review of Systems  Constitutional:  Positive for diaphoresis. Negative for unexpected weight change.  Respiratory:  Negative for shortness of breath.   Cardiovascular:  Negative for chest pain.  Gastrointestinal:  Negative for abdominal pain.  Neurological:  Positive for headaches. Negative for dizziness.  Psychiatric/Behavioral:  The patient is not nervous/anxious.          Past Medical History:  Diagnosis Date   Allergy    Anemia    during pregnancy   Anxiety    Axillary pain, left 06/16/2020   Chest pain 08/23/2018   COVID 12/2020   and October 2021  both mild cases   Depression    Fatigue 08/03/2021   Frequent headaches    Hypotension    Postpartum hemorrhage 03/26/2022   Sensation of foreign body in throat 08/17/2021   SVD (spontaneous vaginal delivery) 03/26/2022    Social  History   Socioeconomic History   Marital status: Single    Spouse name: Not on file   Number of children: Not on file   Years of education: Not on file   Highest education level: Not on file  Occupational History   Not on file  Tobacco Use   Smoking status: Never   Smokeless tobacco: Never  Vaping Use   Vaping Use: Never used  Substance and Sexual Activity   Alcohol use: No   Drug use: No   Sexual activity: Not Currently  Other Topics Concern   Not on file  Social History Narrative   Lives with mother and brother. Her sister passed away in 29-Mar-2013 at age 51 from cancer.   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    Social Determinants of Health   Financial Resource Strain: Not on file  Food Insecurity: Not on file  Transportation Needs: Not on file  Physical Activity: Not on file  Stress: Not on file  Social Connections: Not on file  Intimate Partner Violence: Not on file    Past Surgical History:  Procedure Laterality Date   DILATION AND EVACUATION  04/13/2022   Procedure: DILATATION AND EVACUATION;  Surgeon: Paula Compton, MD;  Location: [facility];  Service: Gynecology;;  LMA Anesthesia   KNEE SURGERY     TOOTH EXTRACTION      Family History  Problem Relation Age of Onset   Hyperlipidemia Mother    Anxiety disorder Mother    Depression Mother    Hypertension Father    Cancer Sister    Uterine cancer Maternal Grandmother    Lung cancer Paternal Grandfather    Migraines Other        Mfhx of migraine   Seizures Cousin    Autism Cousin     No Known Allergies  Current Outpatient Medications on File Prior to Visit  Medication Sig Dispense Refill   acetaminophen (TYLENOL) 325 MG tablet Take 2 tablets (650 mg total) by mouth every 6 (six) hours as needed (for pain scale < 4).     hydrOXYzine (ATARAX) 25 MG tablet Take 25 mg by mouth daily as needed.     ibuprofen (ADVIL) 100 MG/5ML suspension Take 30 mLs (600 mg total) by mouth every 6 (six) hours as needed. 946 mL 3    venlafaxine XR (EFFEXOR-XR) 150 MG 24 hr capsule TAKE 1 CAPSULE(150 MG) BY MOUTH DAILY WITH BREAKFAST FOR ANXIETY 90 capsule 2   benzonatate (TESSALON) 200 MG capsule Take 1 capsule (200 mg total) by mouth 3 (three) times daily as needed for cough. (Patient not taking: Reported on 09/15/2022) 20 capsule 0   No current facility-administered medications on file prior to visit.    BP 116/74   Pulse 86   Temp 98.4 F (36.9 C) (Temporal)   Ht 5\' 5"  (1.651 m)   Wt 128 lb (58.1 kg)   SpO2 95%   BMI 21.30 kg/m  Objective:   Physical Exam Cardiovascular:     Rate and Rhythm: Normal rate and regular rhythm.  Pulmonary:     Effort: Pulmonary effort is normal.     Breath sounds: Normal breath sounds.  Musculoskeletal:     Cervical back: Neck supple.  Skin:    General: Skin is warm and dry.  Psychiatric:        Mood and Affect: Mood normal.           Assessment & Plan:   Problem List Items Addressed This Visit       Other   Chronic headaches    Returned and daily. Will rule out pregnancy prior to starting medication.  Agree to restart Propanolol 20 mg at bedtime daily.  Rx sent to pharmacy.  HCG quantitative pregnancy pending.   I evaluated patient, was consulted regarding treatment, and agree with assessment and plan per Tinnie Gens, RN, DNP student.   Allie Bossier, NP-C       Relevant Medications   propranolol (INDERAL) 20 MG tablet   Excessive sweating - Primary    No alarm signs.  Suspect it could be side effectd of venlafaxine as nothing else has change. Doubt hormonal cause as she is not breast feeding.  Reviewed HIV lab which was negative in 2022 Reviewed pap smear from June 2023 which was normal.  Will check CBC, CMP, TSH, Free T4, HCG pregnancy to rule out metabolic causes.   Discussed venlafaxine being the probable cause, especially if labs are unremarkable. She would like to continue venlafaxine ER 150 for now, consider dose decrease if warranted.    I evaluated patient, was consulted regarding treatment, and agree with assessment and plan per Tinnie Gens, RN, DNP student.   Allie Bossier, NP-C       Relevant Orders   CBC with Differential   Comprehensive metabolic panel   T4, free   TSH   Beta hCG quant (  ref lab)       Pleas Koch, NP

## 2022-09-16 LAB — CBC WITH DIFFERENTIAL/PLATELET
Basophils Absolute: 0 10*3/uL (ref 0.0–0.2)
Basos: 1 %
EOS (ABSOLUTE): 0.1 10*3/uL (ref 0.0–0.4)
Eos: 1 %
Hematocrit: 38.5 % (ref 34.0–46.6)
Hemoglobin: 12.4 g/dL (ref 11.1–15.9)
Immature Grans (Abs): 0 10*3/uL (ref 0.0–0.1)
Immature Granulocytes: 0 %
Lymphocytes Absolute: 2.2 10*3/uL (ref 0.7–3.1)
Lymphs: 49 %
MCH: 28.1 pg (ref 26.6–33.0)
MCHC: 32.2 g/dL (ref 31.5–35.7)
MCV: 87 fL (ref 79–97)
Monocytes Absolute: 0.5 10*3/uL (ref 0.1–0.9)
Monocytes: 11 %
Neutrophils Absolute: 1.7 10*3/uL (ref 1.4–7.0)
Neutrophils: 38 %
Platelets: 335 10*3/uL (ref 150–450)
RBC: 4.41 x10E6/uL (ref 3.77–5.28)
RDW: 12.7 % (ref 11.7–15.4)
WBC: 4.4 10*3/uL (ref 3.4–10.8)

## 2022-09-16 LAB — COMPREHENSIVE METABOLIC PANEL
ALT: 7 IU/L (ref 0–32)
AST: 13 IU/L (ref 0–40)
Albumin/Globulin Ratio: 1.7 (ref 1.2–2.2)
Albumin: 4.6 g/dL (ref 4.0–5.0)
Alkaline Phosphatase: 96 IU/L (ref 44–121)
BUN/Creatinine Ratio: 14 (ref 9–23)
BUN: 11 mg/dL (ref 6–20)
Bilirubin Total: 0.3 mg/dL (ref 0.0–1.2)
CO2: 22 mmol/L (ref 20–29)
Calcium: 9.6 mg/dL (ref 8.7–10.2)
Chloride: 102 mmol/L (ref 96–106)
Creatinine, Ser: 0.79 mg/dL (ref 0.57–1.00)
Globulin, Total: 2.7 g/dL (ref 1.5–4.5)
Glucose: 85 mg/dL (ref 70–99)
Potassium: 4.5 mmol/L (ref 3.5–5.2)
Sodium: 140 mmol/L (ref 134–144)
Total Protein: 7.3 g/dL (ref 6.0–8.5)
eGFR: 109 mL/min/{1.73_m2} (ref >59)

## 2022-09-16 LAB — BETA HCG QUANT (REF LAB): hCG Quant: <1 m[IU]/mL

## 2022-09-16 LAB — T4, FREE: Free T4: 1.09 ng/dL (ref 0.82–1.77)

## 2022-09-16 LAB — TSH: TSH: 0.843 u[IU]/mL (ref 0.450–4.500)

## 2022-10-14 ENCOUNTER — Telehealth: Payer: 59 | Admitting: Physician Assistant

## 2022-10-14 DIAGNOSIS — J329 Chronic sinusitis, unspecified: Secondary | ICD-10-CM | POA: Diagnosis not present

## 2022-10-14 DIAGNOSIS — U071 COVID-19: Secondary | ICD-10-CM | POA: Diagnosis not present

## 2022-10-14 DIAGNOSIS — J40 Bronchitis, not specified as acute or chronic: Secondary | ICD-10-CM | POA: Diagnosis not present

## 2022-10-14 MED ORDER — AZITHROMYCIN 250 MG PO TABS: ORAL_TABLET | ORAL | 0 refills | Status: AC

## 2022-10-14 MED ORDER — PROMETHAZINE-DM 6.25-15 MG/5ML PO SYRP: 5.0000 mL | ORAL_SOLUTION | Freq: Four times a day (QID) | ORAL | 0 refills | Status: DC | PRN

## 2022-10-14 MED ORDER — PREDNISONE 20 MG PO TABS: 20.0000 mg | ORAL_TABLET | Freq: Every day | ORAL | 0 refills | Status: DC

## 2022-10-14 NOTE — Progress Notes (Signed)
Virtual Visit Consent   Andrea Wilson, you are scheduled for a virtual visit with a [facility] provider today. Just as with appointments in the office, your consent must be obtained to participate. Your consent will be active for this visit and any virtual visit you may have with one of our providers in the next 365 days. If you have a MyChart account, a copy of this consent can be sent to you electronically.  As this is a virtual visit, video technology does not allow for your provider to perform a traditional examination. This may limit your provider's ability to fully assess your condition. If your provider identifies any concerns that need to be evaluated in person or the need to arrange testing (such as labs, EKG, etc.), we will make arrangements to do so. Although advances in technology are sophisticated, we cannot ensure that it will always work on either your end or our end. If the connection with a video visit is poor, the visit may have to be switched to a telephone visit. With either a video or telephone visit, we are not always able to ensure that we have a secure connection.  By engaging in this virtual visit, you consent to the provision of healthcare and authorize for your insurance to be billed (if applicable) for the services provided during this visit. Depending on your insurance coverage, you may receive a charge related to this service.  I need to obtain your verbal consent now. Are you willing to proceed with your visit today? Andrea Wilson has provided verbal consent on 10/14/2022 for a virtual visit (video or telephone). Margaretann Loveless, PA-C  Date: 10/14/2022 1:41 PM  Virtual Visit via Video Note   I, Margaretann Loveless, connected with  Andrea Wilson  ([id number], 23-Nov-2001) on 10/14/22 at  1:30 PM EST by a video-enabled telemedicine application and verified that I am speaking with the correct person using two identifiers.  Location: Patient: Virtual Visit  Location Patient: Home Provider: Virtual Visit Location Provider: Home Office   I discussed the limitations of evaluation and management by telemedicine and the availability of in person appointments. The patient expressed understanding and agreed to proceed.    History of Present Illness: Andrea Wilson is a 21 y.o. who identifies as a female who was assigned female at birth, and is being seen today for Covid 88.  HPI: URI  This is a new problem. The current episode started 1 to 4 weeks ago (Symptoms started over a week ago and tested positive for Covid on Monday; had been on cruise when symptoms started). The problem has been gradually worsening. Maximum temperature: subjective fever on 1st night. Associated symptoms include congestion, coughing, diarrhea (in beginning, now improved), headaches, a plugged ear sensation (right), rhinorrhea (post nasal drainage) and sinus pain. Pertinent negatives include no ear pain, nausea, sore throat or vomiting. Associated symptoms comments: Fatigue. She has tried acetaminophen, increased fluids and sleep (tylenol severe cold and flu, dayquil, nyquil) for the symptoms. The treatment provided no relief.     Problems:  Patient Active Problem List   Diagnosis Date Noted   Excessive sweating 09/15/2022   Acute cough 08/01/2022   Severe preeclampsia, third trimester 03/25/2022   Pelvic cramping 08/17/2021   Chronic cystitis 09/13/2019   Abnormal thyroid function test 05/22/2019   Rash and nonspecific skin eruption 04/04/2019   Hemorrhoid 02/11/2019   Screening examination for STD (sexually transmitted disease) 05/08/2017   Migraine without aura and without  status migrainosus, not intractable 11/08/2016   Chronic headaches 10/13/2016   Anxiety and depression 04/30/2015    Allergies: No Known Allergies Medications:  Current Outpatient Medications:    acetaminophen (TYLENOL) 325 MG tablet, Take 2 tablets (650 mg total) by mouth every 6 (six) hours as  needed (for pain scale < 4)., Disp: , Rfl:    azithromycin (ZITHROMAX) 250 MG tablet, Take 2 tablets on day 1, then 1 tablet daily on days 2 through 5, Disp: 6 tablet, Rfl: 0   benzonatate (TESSALON) 200 MG capsule, Take 1 capsule (200 mg total) by mouth 3 (three) times daily as needed for cough. (Patient not taking: Reported on 09/15/2022), Disp: 20 capsule, Rfl: 0   hydrOXYzine (ATARAX) 25 MG tablet, Take 25 mg by mouth daily as needed., Disp: , Rfl:    ibuprofen (ADVIL) 100 MG/5ML suspension, Take 30 mLs (600 mg total) by mouth every 6 (six) hours as needed., Disp: 946 mL, Rfl: 3   predniSONE (DELTASONE) 20 MG tablet, Take 1 tablet (20 mg total) by mouth daily with breakfast., Disp: 5 tablet, Rfl: 0   promethazine-dextromethorphan (PROMETHAZINE-DM) 6.25-15 MG/5ML syrup, Take 5 mLs by mouth 4 (four) times daily as needed., Disp: 118 mL, Rfl: 0   propranolol (INDERAL) 20 MG tablet, Take 1 tablet (20 mg total) by mouth at bedtime. For headache prevention, Disp: 90 tablet, Rfl: 2   venlafaxine XR (EFFEXOR-XR) 150 MG 24 hr capsule, TAKE 1 CAPSULE(150 MG) BY MOUTH DAILY WITH BREAKFAST FOR ANXIETY, Disp: 90 capsule, Rfl: 2  Observations/Objective: Patient is well-developed, well-nourished in no acute distress.  Resting comfortably at home.  Head is normocephalic, atraumatic.  No labored breathing.  Speech is clear and coherent with logical content.  Patient is alert and oriented at baseline.    Assessment and Plan: 1. Sinobronchitis - azithromycin (ZITHROMAX) 250 MG tablet; Take 2 tablets on day 1, then 1 tablet daily on days 2 through 5  Dispense: 6 tablet; Refill: 0 - promethazine-dextromethorphan (PROMETHAZINE-DM) 6.25-15 MG/5ML syrup; Take 5 mLs by mouth 4 (four) times daily as needed.  Dispense: 118 mL; Refill: 0 - predniSONE (DELTASONE) 20 MG tablet; Take 1 tablet (20 mg total) by mouth daily with breakfast.  Dispense: 5 tablet; Refill: 0  2. COVID-19 - promethazine-dextromethorphan  (PROMETHAZINE-DM) 6.25-15 MG/5ML syrup; Take 5 mLs by mouth 4 (four) times daily as needed.  Dispense: 118 mL; Refill: 0 - predniSONE (DELTASONE) 20 MG tablet; Take 1 tablet (20 mg total) by mouth daily with breakfast.  Dispense: 5 tablet; Refill: 0  - Worsening symptoms that have not responded to OTC medications.  - Suspect either secondary infection or co-infection with Covid 19 - Will give Zpack, Prednisone, and Promethazine DM - Continue allergy medications.  - Steam and humidifier can help - Stay well hydrated and get plenty of rest.  - Seek in person evaluation if no symptom improvement or if symptoms worsen   Follow Up Instructions: I discussed the assessment and treatment plan with the patient. The patient was provided an opportunity to ask questions and all were answered. The patient agreed with the plan and demonstrated an understanding of the instructions.  A copy of instructions were sent to the patient via MyChart unless otherwise noted below.    The patient was advised to call back or seek an in-person evaluation if the symptoms worsen or if the condition fails to improve as anticipated.  Time:  I spent 12 minutes with the patient via telehealth technology discussing the above  problems/concerns.    Mar Daring, PA-C

## 2022-10-14 NOTE — Telephone Encounter (Signed)
Unable to reach patient. Left voicemail to return call to our office.    Our office has no openings today, was going to offer to see if any other [facility] had a virtual appt open.

## 2022-10-14 NOTE — Telephone Encounter (Signed)
Patient called in returning call she received. Got patient scheduled for a virtual visit with urgent care as no [facility] offices had anything available. Thank you!

## 2022-10-14 NOTE — Patient Instructions (Signed)
Marti Sleigh, thank you for joining Mar Daring, PA-C for today's virtual visit.  While this provider is not your primary care provider (PCP), if your PCP is located in our provider database this encounter information will be shared with them immediately following your visit.   [facility] account gives you access to today's visit and all your visits, tests, and labs performed at [facility] " click here if you don't have a [facility] account or go to [email or web address]  Consent: (Patient) Marti Sleigh provided verbal consent for this virtual visit at the beginning of the encounter.  Current Medications:  Current Outpatient Medications:    acetaminophen (TYLENOL) 325 MG tablet, Take 2 tablets (650 mg total) by mouth every 6 (six) hours as needed (for pain scale < 4)., Disp: , Rfl:    azithromycin (ZITHROMAX) 250 MG tablet, Take 2 tablets on day 1, then 1 tablet daily on days 2 through 5, Disp: 6 tablet, Rfl: 0   benzonatate (TESSALON) 200 MG capsule, Take 1 capsule (200 mg total) by mouth 3 (three) times daily as needed for cough. (Patient not taking: Reported on 09/15/2022), Disp: 20 capsule, Rfl: 0   hydrOXYzine (ATARAX) 25 MG tablet, Take 25 mg by mouth daily as needed., Disp: , Rfl:    ibuprofen (ADVIL) 100 MG/5ML suspension, Take 30 mLs (600 mg total) by mouth every 6 (six) hours as needed., Disp: 946 mL, Rfl: 3   predniSONE (DELTASONE) 20 MG tablet, Take 1 tablet (20 mg total) by mouth daily with breakfast., Disp: 5 tablet, Rfl: 0   promethazine-dextromethorphan (PROMETHAZINE-DM) 6.25-15 MG/5ML syrup, Take 5 mLs by mouth 4 (four) times daily as needed., Disp: 118 mL, Rfl: 0   propranolol (INDERAL) 20 MG tablet, Take 1 tablet (20 mg total) by mouth at bedtime. For headache prevention, Disp: 90 tablet, Rfl: 2   venlafaxine XR (EFFEXOR-XR) 150 MG 24 hr capsule, TAKE 1 CAPSULE(150 MG) BY MOUTH DAILY WITH BREAKFAST FOR ANXIETY, Disp: 90  capsule, Rfl: 2   Medications ordered in this encounter:  Meds ordered this encounter  Medications   azithromycin (ZITHROMAX) 250 MG tablet    Sig: Take 2 tablets on day 1, then 1 tablet daily on days 2 through 5    Dispense:  6 tablet    Refill:  0    Order Specific Question:   Supervising Provider    Answer:   Chase Picket [id number]   promethazine-dextromethorphan (PROMETHAZINE-DM) 6.25-15 MG/5ML syrup    Sig: Take 5 mLs by mouth 4 (four) times daily as needed.    Dispense:  118 mL    Refill:  0    Order Specific Question:   Supervising Provider    Answer:   Chase Picket [id number]   predniSONE (DELTASONE) 20 MG tablet    Sig: Take 1 tablet (20 mg total) by mouth daily with breakfast.    Dispense:  5 tablet    Refill:  0    Order Specific Question:   Supervising Provider    Answer:   Chase Picket [id number]     *If you need refills on other medications prior to your next appointment, please contact your pharmacy*  Follow-Up: Call back or seek an in-person evaluation if the symptoms worsen or if the condition fails to improve as anticipated.  [facility] [phone]  Other Instructions  Can take to lessen severity: Vit C 500mg  twice daily Quercertin 250-500mg  twice daily Zinc 75-100mg   daily Melatonin 3-6 mg at bedtime Vit D3 1000-2000 IU daily Aspirin 81 mg daily with food Optional: Famotidine 20mg  daily Also can add tylenol/ibuprofen as needed for fevers and body aches May add Mucinex or Mucinex DM as needed for cough/congestion   Sinus Infection, Adult A sinus infection is soreness and swelling (inflammation) of your sinuses. Sinuses are hollow spaces in the bones around your face. They are located: Around your eyes. In the middle of your forehead. Behind your nose. In your cheekbones. Your sinuses and nasal passages are lined with a fluid called mucus. Mucus drains out of your sinuses. Swelling can trap mucus in your sinuses. This  lets germs (bacteria, virus, or fungus) grow, which leads to infection. Most of the time, this condition is caused by a virus. What are the causes? Allergies. Asthma. Germs. Things that block your nose or sinuses. Growths in the nose (nasal polyps). Chemicals or irritants in the air. A fungus. This is rare. What increases the risk? Having a weak body defense system (immune system). Doing a lot of swimming or diving. Using nasal sprays too much. Smoking. What are the signs or symptoms? The main symptoms of this condition are pain and a feeling of pressure around the sinuses. Other symptoms include: Stuffy nose (congestion). This may make it hard to breathe through your nose. Runny nose (drainage). Soreness, swelling, and warmth in the sinuses. A cough that may get worse at night. Being unable to smell and taste. Mucus that collects in the throat or the back of the nose (postnasal drip). This may cause a sore throat or bad breath. Being very tired (fatigued). A fever. How is this diagnosed? Your symptoms. Your medical history. A physical exam. Tests to find out if your condition is short-term (acute) or long-term (chronic). Your doctor may: Check your nose for growths (polyps). Check your sinuses using a tool that has a light on one end (endoscope). Check for allergies or germs. Do imaging tests, such as an MRI or CT scan. How is this treated? Treatment for this condition depends on the cause and whether it is short-term or long-term. If caused by a virus, your symptoms should go away on their own within 10 days. You may be given medicines to relieve symptoms. They include: Medicines that shrink swollen tissue in the nose. A spray that treats swelling of the nostrils. Rinses that help get rid of thick mucus in your nose (nasal saline washes). Medicines that treat allergies (antihistamines). Over-the-counter pain relievers. If caused by bacteria, your doctor may wait to see if  you will get better without treatment. You may be given antibiotic medicine if you have: A very bad infection. A weak body defense system. If caused by growths in the nose, surgery may be needed. Follow these instructions at home: Medicines Take, use, or apply over-the-counter and prescription medicines only as told by your doctor. These may include nasal sprays. If you were prescribed an antibiotic medicine, take it as told by your doctor. Do not stop taking it even if you start to feel better. Hydrate and humidify  Drink enough water to keep your pee (urine) pale yellow. Use a cool mist humidifier to keep the humidity level in your home above 50%. Breathe in steam for 10-15 minutes, 3-4 times a day, or as told by your doctor. You can do this in the bathroom while a hot shower is running. Try not to spend time in cool or dry air. Rest Rest as much as you  can. Sleep with your head raised (elevated). Make sure you get enough sleep each night. General instructions  Put a warm, moist washcloth on your face 3-4 times a day, or as often as told by your doctor. Use nasal saline washes as often as told by your doctor. Wash your hands often with soap and water. If you cannot use soap and water, use hand sanitizer. Do not smoke. Avoid being around people who are smoking (secondhand smoke). Keep all follow-up visits. Contact a doctor if: You have a fever. Your symptoms get worse. Your symptoms do not get better within 10 days. Get help right away if: You have a very bad headache. You cannot stop vomiting. You have very bad pain or swelling around your face or eyes. You have trouble seeing. You feel confused. Your neck is stiff. You have trouble breathing. These symptoms may be an emergency. Get help right away. Call 911. Do not wait to see if the symptoms will go away. Do not drive yourself to the hospital. Summary A sinus infection is swelling of your sinuses. Sinuses are hollow  spaces in the bones around your face. This condition is caused by tissues in your nose that become inflamed or swollen. This traps germs. These can lead to infection. If you were prescribed an antibiotic medicine, take it as told by your doctor. Do not stop taking it even if you start to feel better. Keep all follow-up visits. This information is not intended to replace advice given to you by your health care provider. Make sure you discuss any questions you have with your health care provider. Document Revised: 10/26/2021 Document Reviewed: 10/26/2021 Elsevier Patient Education  2023 Elsevier Inc.   COVID-19 COVID-19, or coronavirus disease 2019, is an infection that is caused by a new (novel) coronavirus called SARS-CoV-2. COVID-19 can cause many symptoms. In some people, the virus may not cause any symptoms. In others, it may cause mild or severe symptoms. Some people with severe infection develop severe disease. What are the causes? This illness is caused by a virus. The virus may be in the air as tiny specks of fluid (aerosols) or droplets, or it may be on surfaces. You may catch the virus by: Breathing in droplets from an infected person. Droplets can be spread by a person breathing, speaking, singing, coughing, or sneezing. Touching something, like a table or a doorknob, that has virus on it (is contaminated) and then touching your mouth, nose, or eyes. What increases the risk? Risk for infection: You are more likely to get infected with the COVID-19 virus if: You are within 6 ft (1.8 m) of a person with COVID-19 for 15 minutes or longer. You are providing care for a person who is infected with COVID-19. You are in close personal contact with other people. Close personal contact includes hugging, kissing, or sharing eating or drinking utensils. Risk for serious illness caused by COVID-19: You are more likely to get seriously ill from the COVID-19 virus if: You have cancer. You have a  long-term (chronic) disease, such as: Chronic lung disease. This includes pulmonary embolism, chronic obstructive pulmonary disease, and cystic fibrosis. Long-term disease that lowers your body's ability to fight infection (immunocompromise). Serious cardiac conditions, such as heart failure, coronary artery disease, or cardiomyopathy. Diabetes. Chronic kidney disease. Liver diseases. These include cirrhosis, nonalcoholic fatty liver disease, alcoholic liver disease, or autoimmune hepatitis. You have obesity. You are pregnant or were recently pregnant. You have sickle cell disease. What are the signs  or symptoms? Symptoms of this condition can range from mild to severe. Symptoms may appear any time from 2 to 14 days after being exposed to the virus. They include: Fever or chills. Shortness of breath or trouble breathing. Feeling tired or very tired. Headaches, body aches, or muscle aches. Runny or stuffy nose, sneezing, coughing, or sore throat. New loss of taste or smell. This is rare. Some people may also have stomach problems, such as nausea, vomiting, or diarrhea. Other people may not have any symptoms of COVID-19. How is this diagnosed? This condition may be diagnosed by testing samples to check for the COVID-19 virus. The most common tests are the PCR test and the antigen test. Tests may be done in the lab or at home. They include: Using a swab to take a sample of fluid from the back of your nose and throat (nasopharyngeal fluid), from your nose, or from your throat. Testing a sample of saliva from your mouth. Testing a sample of coughed-up mucus from your lungs (sputum). How is this treated? Treatment for COVID-19 infection depends on the severity of the condition. Mild symptoms can be managed at home with rest, fluids, and over-the-counter medicines. Serious symptoms may be treated in a hospital intensive care unit (ICU). Treatment in the ICU may include: Supplemental oxygen.  Extra oxygen is given through a tube in the nose, a face mask, or a hood. Medicines. These may include: Antivirals, such as monoclonal antibodies. These help your body fight off certain viruses that can cause disease. Anti-inflammatories, such as corticosteroids. These reduce inflammation and suppress the immune system. Antithrombotics. These prevent or treat blood clots, if they develop. Convalescent plasma. This helps boost your immune system, if you have an underlying immunosuppressive condition or are getting immunosuppressive treatments. Prone positioning. This means you will lie on your stomach. This helps oxygen to get into your lungs. Infection control measures. If you are at risk for more serious illness caused by COVID-19, your health care provider may prescribe two long-acting monoclonal antibodies, given together every 6 months. How is this prevented? To protect yourself: Use preventive medicine (pre-exposure prophylaxis). You may get pre-exposure prophylaxis if you have moderate or severe immunocompromise. Get vaccinated. Anyone 31 months old or older who meets guidelines can get a COVID-19 vaccine or vaccine series. This includes people who are pregnant or making breast milk (lactating). Get an added dose of COVID-19 vaccine after your first vaccine or vaccine series if you have moderate to severe immunocompromise. This applies if you have had a solid organ transplant or have been diagnosed with an immunocompromising condition. You should get the added dose 4 weeks after you got the first COVID-19 vaccine or vaccine series. If you get an mRNA vaccine, you will need a 3-dose primary series. If you get the J&J/Janssen vaccine, you will need a 2-dose primary series, with the second dose being an mRNA vaccine. Talk to your health care provider about getting experimental monoclonal antibodies. This treatment is approved under emergency use authorization to prevent severe illness before or  after being exposed to the COVID-19 virus. You may be given monoclonal antibodies if: You have moderate or severe immunocompromise. This includes treatments that lower your immune response. People with immunocompromise may not develop protection against COVID-19 when they are vaccinated. You cannot be vaccinated. You may not get a vaccine if you have a severe allergic reaction to the vaccine or its components. You are not fully vaccinated. You are in a facility where COVID-19 is  present and: Are in close contact with a person who is infected with the COVID-19 virus. Are at high risk of being exposed to the COVID-19 virus. You are at risk of illness from new variants of the COVID-19 virus. To protect others: If you have symptoms of COVID-19, take steps to prevent the virus from spreading to others. Stay home. Leave your house only to get medical care. Do not use public transit, if possible. Do not travel while you are sick. Wash your hands often with soap and water for at least 20 seconds. If soap and water are not available, use alcohol-based hand sanitizer. Make sure that all people in your household wash their hands well and often. Cough or sneeze into a tissue or your sleeve or elbow. Do not cough or sneeze into your hand or into the air. Where to find more information Centers for Disease Control and Prevention: CharmCourses.be World Health Organization: https://www.castaneda.info/ Get help right away if: You have trouble breathing. You have pain or pressure in your chest. You are confused. You have bluish lips and fingernails. You have trouble waking from sleep. You have symptoms that get worse. These symptoms may be an emergency. Get help right away. Call 911. Do not wait to see if the symptoms will go away. Do not drive yourself to the hospital. Summary COVID-19 is an infection that is caused by a new coronavirus. Sometimes, there are no symptoms. Other times,  symptoms range from mild to severe. Some people with a severe COVID-19 infection develop severe disease. The virus that causes COVID-19 can spread from person to person through droplets or aerosols from breathing, speaking, singing, coughing, or sneezing. Mild symptoms of COVID-19 can be managed at home with rest, fluids, and over-the-counter medicines. This information is not intended to replace advice given to you by your health care provider. Make sure you discuss any questions you have with your health care provider. Document Revised: 11/09/2021 Document Reviewed: 11/11/2021 Elsevier Patient Education  [place].    If you have been instructed to have an in-person evaluation today at a local Urgent Care facility, please use the link below. It will take you to a list of all of our available [facility], including address, phone number and hours of operation. Please do not delay care.  [facility]  If you or a family member do not have a primary care provider, use the link below to schedule a visit and establish care. When you choose a [facility] primary care physician or advanced practice provider, you gain a long-term partner in health. Find a Primary Care Provider  Learn more about [facility]'s in-office and virtual care options: [facility]

## 2022-11-11 ENCOUNTER — Ambulatory Visit: Payer: 59 | Admitting: Primary Care

## 2022-12-28 ENCOUNTER — Ambulatory Visit (INDEPENDENT_AMBULATORY_CARE_PROVIDER_SITE_OTHER): Payer: 59 | Admitting: Primary Care

## 2022-12-28 ENCOUNTER — Encounter: Payer: Self-pay | Admitting: Primary Care

## 2022-12-28 VITALS — BP 110/82 | HR 105 | Temp 98.6°F | Ht 65.0 in | Wt 134.0 lb

## 2022-12-28 DIAGNOSIS — J029 Acute pharyngitis, unspecified: Secondary | ICD-10-CM | POA: Insufficient documentation

## 2022-12-28 LAB — POCT RAPID STREP A (OFFICE): Rapid Strep A Screen: NEGATIVE

## 2022-12-28 LAB — POCT INFLUENZA A/B
Influenza A, POC: NEGATIVE
Influenza B, POC: NEGATIVE

## 2022-12-28 LAB — POC COVID19 BINAXNOW: SARS Coronavirus 2 Ag: NEGATIVE

## 2022-12-28 NOTE — Progress Notes (Signed)
Subjective:    Patient ID: Andrea Wilson, female    DOB: 11-14-2001, 22 y.o.   MRN: [id number]  Sore Throat  Associated symptoms include congestion and headaches. Pertinent negatives include no coughing.    Andrea Wilson is a very pleasant 22 y.o. female who presents today to discuss URI symptoms.   Symptom onset yesterday with scratchy throat, sinus pressure, rhinorrhea, dry cough, fatigue, and chills. She's not checked her temperature. She's also noticed headaches but hasn't been taking her propranolol for headache prevention.   She was exposed to strep pharyngitis three days ago from her cousin.   She has not tested for Covid-19 infection.   Review of Systems  Constitutional:  Positive for chills and fatigue. Negative for fever.  HENT:  Positive for congestion, rhinorrhea, sinus pressure and sore throat.   Respiratory:  Negative for cough.   Neurological:  Positive for headaches.         Past Medical History:  Diagnosis Date   Allergy    Anemia    during pregnancy   Anxiety    Axillary pain, left 06/16/2020   Chest pain 08/23/2018   COVID 12/2020   and October 2021  both mild cases   Depression    Fatigue 08/03/2021   Frequent headaches    Hypotension    Postpartum hemorrhage 03/26/2022   Sensation of foreign body in throat 08/17/2021   SVD (spontaneous vaginal delivery) 03/26/2022    Social History   Socioeconomic History   Marital status: Single    Spouse name: Not on file   Number of children: Not on file   Years of education: Not on file   Highest education level: Not on file  Occupational History   Not on file  Tobacco Use   Smoking status: Never   Smokeless tobacco: Never  Vaping Use   Vaping Use: Never used  Substance and Sexual Activity   Alcohol use: No   Drug use: No   Sexual activity: Not Currently  Other Topics Concern   Not on file  Social History Narrative   Lives with mother and brother. Her sister passed away in March 15, 2013 at age 20  from cancer.   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    Social Determinants of Health   Financial Resource Strain: Not on file  Food Insecurity: Not on file  Transportation Needs: Not on file  Physical Activity: Not on file  Stress: Not on file  Social Connections: Not on file  Intimate Partner Violence: Not on file    Past Surgical History:  Procedure Laterality Date   DILATION AND EVACUATION  04/13/2022   Procedure: DILATATION AND EVACUATION;  Surgeon: Paula Compton, MD;  Location: [facility];  Service: Gynecology;;  LMA Anesthesia   KNEE SURGERY     TOOTH EXTRACTION      Family History  Problem Relation Age of Onset   Hyperlipidemia Mother    Anxiety disorder Mother    Depression Mother    Hypertension Father    Cancer Sister    Uterine cancer Maternal Grandmother    Lung cancer Paternal Grandfather    Migraines Other        Mfhx of migraine   Seizures Cousin    Autism Cousin     No Known Allergies  Current Outpatient Medications on File Prior to Visit  Medication Sig Dispense Refill   acetaminophen (TYLENOL) 325 MG tablet Take 2 tablets (650 mg total) by mouth every 6 (six) hours  as needed (for pain scale < 4).     hydrOXYzine (ATARAX) 25 MG tablet Take 25 mg by mouth daily as needed.     ibuprofen (ADVIL) 100 MG/5ML suspension Take 30 mLs (600 mg total) by mouth every 6 (six) hours as needed. 946 mL 3   propranolol (INDERAL) 20 MG tablet Take 1 tablet (20 mg total) by mouth at bedtime. For headache prevention 90 tablet 2   venlafaxine XR (EFFEXOR-XR) 150 MG 24 hr capsule TAKE 1 CAPSULE(150 MG) BY MOUTH DAILY WITH BREAKFAST FOR ANXIETY 90 capsule 2   benzonatate (TESSALON) 200 MG capsule Take 1 capsule (200 mg total) by mouth 3 (three) times daily as needed for cough. (Patient not taking: Reported on 09/15/2022) 20 capsule 0   predniSONE (DELTASONE) 20 MG tablet Take 1 tablet (20 mg total) by mouth daily with breakfast. (Patient not taking: Reported on 12/28/2022) 5 tablet  0   promethazine-dextromethorphan (PROMETHAZINE-DM) 6.25-15 MG/5ML syrup Take 5 mLs by mouth 4 (four) times daily as needed. (Patient not taking: Reported on 12/28/2022) 118 mL 0   No current facility-administered medications on file prior to visit.    BP 110/82   Pulse (!) 105   Temp 98.6 F (37 C) (Temporal)   Ht 5\' 5"  (1.651 m)   Wt 134 lb (60.8 kg)   SpO2 97%   BMI 22.30 kg/m  Objective:   Physical Exam HENT:     Right Ear: Tympanic membrane and ear canal normal.     Left Ear: Tympanic membrane and ear canal normal.     Nose:     Right Sinus: No maxillary sinus tenderness or frontal sinus tenderness.     Left Sinus: No maxillary sinus tenderness or frontal sinus tenderness.     Mouth/Throat:     Pharynx: No posterior oropharyngeal erythema.  Eyes:     Conjunctiva/sclera: Conjunctivae normal.  Cardiovascular:     Rate and Rhythm: Normal rate and regular rhythm.  Pulmonary:     Effort: Pulmonary effort is normal.     Breath sounds: Normal breath sounds. No wheezing or rales.  Musculoskeletal:     Cervical back: Neck supple.  Lymphadenopathy:     Cervical: No cervical adenopathy.  Skin:    General: Skin is warm and dry.           Assessment & Plan:  Sore throat Assessment & Plan: POC Rapid Strep A negative.  POC Covid and Influenza negative.   No red flags on physical exam.   Given the duration of symptoms, likely viral upper respiratory infection.   Discuss using OTC medications such as delsym or robitussin; antihistamine such as claritin or zyrtec for runny nose; staying hydrated and drinking plenty of fluids and rest.   She will update if symptoms worsen or there is no improvement.  I evaluated patient, was consulted regarding treatment, and agree with assessment and plan per Tinnie Gens, RN, DNP student.   Allie Bossier, NP-C   Orders: -     POCT rapid strep A -     POCT Influenza A/B -     POC COVID-19 [facility]Now        Pleas Koch,  NP

## 2022-12-28 NOTE — Progress Notes (Signed)
Established Patient Office Visit  Subjective   Patient ID: Andrea Wilson, female    DOB: [birth year]  Age: 22 y.o. MRN: [id number]  Chief Complaint  Patient presents with   Sore Throat    Sore and itchy throat, runny nose, sinus pressure and a cough started yesterday  Was exposed to Strep recently by her cousin.     Sore Throat  Associated symptoms include congestion, coughing and headaches. Pertinent negatives include no ear pain or shortness of breath.    Andrea Wilson is a 22 year old female with past medical history of anxiety, depression, migraines presents today for an acute visit.   Sore throat: Symptoms started yesterday with sore and itchy throat, runny nose, sinus pressure and a dry cough. She was exposed to strep on Sunday by her cousin. She endorses chills. She has not checked her fever and does not know if she has had one. She does have headaches attributes that not taking her propanolol. She did take some aleve and dayquil and did give her some relief. She does have some nausea that started today. She does have fatigue but no body aches. She has not done a covid test at home.   Patient Active Problem List   Diagnosis Date Noted   Sore throat 12/28/2022   Excessive sweating 09/15/2022   Acute cough 08/01/2022   Severe preeclampsia, third trimester 03/25/2022   Pelvic cramping 08/17/2021   Chronic cystitis 09/13/2019   Abnormal thyroid function test 05/22/2019   Rash and nonspecific skin eruption 04/04/2019   Hemorrhoid 02/11/2019   Screening examination for STD (sexually transmitted disease) 05/08/2017   Migraine without aura and without status migrainosus, not intractable 11/08/2016   Chronic headaches 10/13/2016   Anxiety and depression 04/30/2015   Past Medical History:  Diagnosis Date   Allergy    Anemia    during pregnancy   Anxiety    Axillary pain, left 06/16/2020   Chest pain 08/23/2018   COVID 12/2020   and October 2021  both mild cases    Depression    Fatigue 08/03/2021   Frequent headaches    Hypotension    Postpartum hemorrhage 03/26/2022   Sensation of foreign body in throat 08/17/2021   SVD (spontaneous vaginal delivery) 03/26/2022   Past Surgical History:  Procedure Laterality Date   DILATION AND EVACUATION  04/13/2022   Procedure: DILATATION AND EVACUATION;  Surgeon: Paula Compton, MD;  Location: [facility] OR;  Service: Gynecology;;  LMA Anesthesia   KNEE SURGERY     TOOTH EXTRACTION     Social History   Tobacco Use   Smoking status: Never   Smokeless tobacco: Never  Vaping Use   Vaping Use: Never used  Substance Use Topics   Alcohol use: No   Drug use: No   Family History  Problem Relation Age of Onset   Hyperlipidemia Mother    Anxiety disorder Mother    Depression Mother    Hypertension Father    Cancer Sister    Uterine cancer Maternal Grandmother    Lung cancer Paternal Grandfather    Migraines Other        Mfhx of migraine   Seizures Cousin    Autism Cousin    No Known Allergies      Review of Systems  Constitutional:  Positive for chills and malaise/fatigue. Negative for fever.  HENT:  Positive for congestion and sore throat. Negative for ear pain.   Respiratory:  Positive for cough. Negative for shortness  of breath and wheezing.   Cardiovascular:  Negative for chest pain.  Gastrointestinal:  Positive for nausea.  Neurological:  Positive for headaches.      Objective:     BP 110/82   Pulse (!) 105   Temp 98.6 F (37 C) (Temporal)   Ht 5\' 5"  (1.651 m)   Wt 134 lb (60.8 kg)   SpO2 97%   BMI 22.30 kg/m  BP Readings from Last 3 Encounters:  12/28/22 110/82  09/15/22 116/74  08/17/22 117/68   Wt Readings from Last 3 Encounters:  12/28/22 134 lb (60.8 kg)  09/15/22 128 lb (58.1 kg)  08/01/22 131 lb (59.4 kg)      Physical Exam Vitals reviewed.  HENT:     Right Ear: Tympanic membrane, ear canal and external ear normal.     Left Ear: Tympanic membrane, ear canal and  external ear normal.     Nose:     Comments: Frontal and Maxillary pressure bilateral    Mouth/Throat:     Mouth: Mucous membranes are moist.  Cardiovascular:     Rate and Rhythm: Normal rate and regular rhythm.     Pulses: Normal pulses.     Heart sounds: Normal heart sounds.  Pulmonary:     Effort: Pulmonary effort is normal.     Breath sounds: Normal breath sounds.  Neurological:     Mental Status: She is alert and oriented to person, place, and time.  Psychiatric:        Mood and Affect: Mood normal.        Behavior: Behavior normal.      Results for orders placed or performed in visit on 12/28/22  POCT rapid strep A  Result Value Ref Range   Rapid Strep A Screen Negative Negative  Influenza A/B  Result Value Ref Range   Influenza A, POC Negative Negative   Influenza B, POC Negative Negative  POC COVID-19  Result Value Ref Range   SARS Coronavirus 2 Ag Negative Negative       The ASCVD Risk score (Arnett DK, et al., 2019) failed to calculate for the following reasons:   The 2019 ASCVD risk score is only valid for ages 30 to 69    Assessment & Plan:   Problem List Items Addressed This Visit       Other   Sore throat - Primary    POC Rapid Strep A negative.  POC Covid and Influenza negative.   No red flags on physical exam.   Given the duration of symptoms, viral vs bacterial upper respiratory infection.   Discuss using OTC medications such as delsym or robitussin; antihistamine such as claritin or zyrtec for runny nose; staying hydrated and drinking plenty of fluids and rest.   She will update if symptoms worsen or there is no improvement.      Relevant Orders   POCT rapid strep A (Completed)   Influenza A/B (Completed)   POC COVID-19 (Completed)    No follow-ups on file.    Tinnie Gens, BSN-RN, DNP STUDENT

## 2022-12-28 NOTE — Assessment & Plan Note (Addendum)
POC Rapid Strep A negative.  POC Covid and Influenza negative.   No red flags on physical exam.   Given the duration of symptoms, likely viral upper respiratory infection.   Discuss using OTC medications such as delsym or robitussin; antihistamine such as claritin or zyrtec for runny nose; staying hydrated and drinking plenty of fluids and rest.   She will update if symptoms worsen or there is no improvement.  I evaluated patient, was consulted regarding treatment, and agree with assessment and plan per Tinnie Gens, RN, DNP student.   Allie Bossier, NP-C

## 2022-12-28 NOTE — Telephone Encounter (Signed)
Called and scheduled patient appointment with Anda Kraft at 12:20 today

## 2022-12-28 NOTE — Patient Instructions (Signed)
You can try a few things over the counter to help with your symptoms including:  Cough: Delsym or Robitussin (get the off brand, works just as well) Chest Congestion: Mucinex (plain) Nasal Congestion/Ear Pressure/Sinus Pressure: Try using Flonase (fluticasone) nasal spray. Instill 1 spray in each nostril twice daily. This can be purchased over the counter. Body aches, fevers, headache: Ibuprofen (not to exceed 2400 mg in 24 hours) or Acetaminophen-Tylenol (not to exceed 3000 mg in 24 hours) Runny Nose/Throat Drainage/Sneezing/Itchy or Watery Eyes: An antihistamine such as Zyrtec, Claritin, Xyzal, Allegra  You should be feeling better by day seven of symptoms, but please do contact me if this is not the case.  It was a pleasure to see you today!

## 2023-05-23 DIAGNOSIS — F419 Anxiety disorder, unspecified: Secondary | ICD-10-CM

## 2023-05-26 ENCOUNTER — Ambulatory Visit (INDEPENDENT_AMBULATORY_CARE_PROVIDER_SITE_OTHER): Payer: 59 | Admitting: Primary Care

## 2023-05-26 ENCOUNTER — Encounter: Payer: Self-pay | Admitting: Primary Care

## 2023-05-26 VITALS — BP 108/62 | HR 88 | Temp 98.1°F | Ht 65.0 in | Wt 131.0 lb

## 2023-05-26 DIAGNOSIS — F32A Depression, unspecified: Secondary | ICD-10-CM | POA: Diagnosis not present

## 2023-05-26 DIAGNOSIS — F419 Anxiety disorder, unspecified: Secondary | ICD-10-CM | POA: Diagnosis not present

## 2023-05-26 MED ORDER — VENLAFAXINE HCL ER 37.5 MG PO CP24: ORAL_CAPSULE | ORAL | 0 refills | Status: DC

## 2023-05-26 MED ORDER — SERTRALINE HCL 50 MG PO TABS: ORAL_TABLET | ORAL | 0 refills | Status: DC

## 2023-05-26 NOTE — Patient Instructions (Signed)
We are weaning you off of the venlafaxine medication.  Start venlafaxine ER 37.5 mg tablets.  Take 2 tablets by mouth every morning for 2 weeks, then reduce to 1 tablet by mouth every morning for 2 weeks, then stop.  Start sertraline (Zoloft) 50 mg tablets.  Take 1/2 tablet by mouth every morning for 2 weeks, then increase to 1 tablet every morning thereafter.  Please schedule a follow up visit for 6 weeks for follow up of anxiety/depression.  It was a pleasure to see you today!

## 2023-05-26 NOTE — Progress Notes (Signed)
Subjective:    Patient ID: Andrea Wilson, female    DOB: 03-Aug-2001, 22 y.o.   MRN: [id number]  HPI  Andrea Wilson is a very pleasant 22 y.o. female with a history of migraines, anxiety and depression, chronic headaches who presents today to discuss mood swings.  Her boyfriend joins [place] today.  Symptoms include tearfulness for now reason, difficulty making decisions, worrying about things, increased irritability. Symptoms began about 3 months ago, friends and family have noticed.   Currently managed on venlafaxine ER 150 mg daily for anxiety/depression and propranolol 20 mg daily for headache prevention. She does feel that venlafaxine has helped with depression, but she is planning on becoming pregnant again soon.   She's tried therapy multiple times in the past which was not for her. She tried Prozac years ago but doesn't remember how she did on the medication. She denies SI/HI.    Review of Systems  Respiratory:  Negative for shortness of breath.   Cardiovascular:  Negative for palpitations.  Neurological:  Negative for headaches.  Psychiatric/Behavioral:  The patient is nervous/anxious.          Past Medical History:  Diagnosis Date   Allergy    Anemia    during pregnancy   Anxiety    Axillary pain, left 06/16/2020   Chest pain 08/23/2018   COVID 12/2020   and October 2021  both mild cases   Depression    Fatigue 08/03/2021   Frequent headaches    Hypotension    Postpartum hemorrhage 03/26/2022   Sensation of foreign body in throat 08/17/2021   SVD (spontaneous vaginal delivery) 03/26/2022    Social History   Socioeconomic History   Marital status: Single    Spouse name: Not on file   Number of children: Not on file   Years of education: Not on file   Highest education level: Not on file  Occupational History   Not on file  Tobacco Use   Smoking status: Never   Smokeless tobacco: Never  Vaping Use   Vaping Use: Never used  Substance and Sexual  Activity   Alcohol use: No   Drug use: No   Sexual activity: Not Currently  Other Topics Concern   Not on file  Social History Narrative   Lives with mother and brother. Her sister passed away in June 18, 2013 at age 53 from cancer.   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    Social Determinants of Health   Financial Resource Strain: Not on file  Food Insecurity: Not on file  Transportation Needs: Not on file  Physical Activity: Not on file  Stress: Not on file  Social Connections: Not on file  Intimate Partner Violence: Not on file    Past Surgical History:  Procedure Laterality Date   DILATION AND EVACUATION  04/13/2022   Procedure: DILATATION AND EVACUATION;  Surgeon: Huel Cote, MD;  Location: [facility] OR;  Service: Gynecology;;  LMA Anesthesia   KNEE SURGERY     TOOTH EXTRACTION      Family History  Problem Relation Age of Onset   Hyperlipidemia Mother    Anxiety disorder Mother    Depression Mother    Hypertension Father    Cancer Sister    Uterine cancer Maternal Grandmother    Lung cancer Paternal Grandfather    Migraines Other        Mfhx of migraine   Seizures Cousin    Autism Cousin     No Known Allergies  Current Outpatient Medications on File Prior to Visit  Medication Sig Dispense Refill   acetaminophen (TYLENOL) 325 MG tablet Take 2 tablets (650 mg total) by mouth every 6 (six) hours as needed (for pain scale < 4).     hydrOXYzine (ATARAX) 25 MG tablet Take 25 mg by mouth daily as needed.     ibuprofen (ADVIL) 100 MG/5ML suspension Take 30 mLs (600 mg total) by mouth every 6 (six) hours as needed. 946 mL 3   propranolol (INDERAL) 20 MG tablet Take 1 tablet (20 mg total) by mouth at bedtime. For headache prevention 90 tablet 2   No current facility-administered medications on file prior to visit.    BP 108/62   Pulse 88   Temp 98.1 F (36.7 C) (Temporal)   Ht 5\' 5"  (1.651 m)   Wt 131 lb (59.4 kg)   SpO2 98%   Breastfeeding No   BMI 21.80 kg/m   Objective:   Physical Exam Cardiovascular:     Rate and Rhythm: Normal rate and regular rhythm.  Pulmonary:     Effort: Pulmonary effort is normal.     Breath sounds: Normal breath sounds.  Musculoskeletal:     Cervical back: Neck supple.  Skin:    General: Skin is warm and dry.           Assessment & Plan:  Anxiety and depression Assessment & Plan: Depression controlled, anxiety appears uncontrolled.  Discussed options for treatment.  Given that she plans on pregnancy soon buspirone would not be a good addition at this time.  Will wean off venlafaxine to 75 mg x 2 weeks, then 37.5 mg x 2 weeks then stop. Will transition to Zoloft 50 mg.  Start with 25 mg daily x 2 weeks, then 50 mg daily thereafter.  Offered therapy referral for which she declines. Consider psychiatry.  Follow up in 6 weeks.   Orders: -     Venlafaxine HCl ER; Take 2 capsules by mouth every morning for 2 weeks, then reduce to 1 capsule by mouth every morning for 2 weeks, then stop.  Dispense: 42 capsule; Refill: 0 -     Sertraline HCl; Take 1/2 tablet by mouth daily for 2 weeks, then increase to 1 tablet daily thereafter for anxiety and depression  Dispense: 90 tablet; Refill: 0        Doreene Nest, NP

## 2023-05-26 NOTE — Assessment & Plan Note (Signed)
Depression controlled, anxiety appears uncontrolled.  Discussed options for treatment.  Given that she plans on pregnancy soon buspirone would not be a good addition at this time.  Will wean off venlafaxine to 75 mg x 2 weeks, then 37.5 mg x 2 weeks then stop. Will transition to Zoloft 50 mg.  Start with 25 mg daily x 2 weeks, then 50 mg daily thereafter.  Offered therapy referral for which she declines. Consider psychiatry.  Follow up in 6 weeks.

## 2023-06-05 MED ORDER — VENLAFAXINE HCL ER 37.5 MG PO CP24: 37.5000 mg | ORAL_CAPSULE | Freq: Every day | ORAL | 0 refills | Status: DC

## 2023-06-05 NOTE — Addendum Note (Signed)
Addended by: Doreene Nest on: 06/05/2023 03:21 PM   Modules accepted: Orders

## 2023-06-07 ENCOUNTER — Ambulatory Visit (INDEPENDENT_AMBULATORY_CARE_PROVIDER_SITE_OTHER): Payer: 59 | Admitting: Primary Care

## 2023-06-07 ENCOUNTER — Encounter: Payer: Self-pay | Admitting: Primary Care

## 2023-06-07 VITALS — BP 128/82 | HR 71 | Temp 97.1°F | Ht 65.0 in | Wt 130.0 lb

## 2023-06-07 DIAGNOSIS — L237 Allergic contact dermatitis due to plants, except food: Secondary | ICD-10-CM

## 2023-06-07 HISTORY — DX: Allergic contact dermatitis due to plants, except food: L23.7

## 2023-06-07 MED ORDER — METHYLPREDNISOLONE ACETATE 80 MG/ML IJ SUSP
80.0000 mg | Freq: Once | INTRAMUSCULAR | Status: AC
  Administered 2023-06-07: 80 mg via INTRAMUSCULAR

## 2023-06-07 NOTE — Assessment & Plan Note (Signed)
Exam today representative of a mild case.  IM Depo-Medrol 80 mg provided today.  Continue cortisone spray as needed.  She will update if symptoms do not improve, or return after improvement.  At that point we need to consider prednisone prescription.

## 2023-06-07 NOTE — Patient Instructions (Signed)
Please notify me if your rash does not improve or if it returns after improvement.  It was a pleasure to see you today!

## 2023-06-07 NOTE — Addendum Note (Signed)
Addended by: Lonia Blood on: 06/07/2023 11:40 AM   Modules accepted: Orders

## 2023-06-07 NOTE — Progress Notes (Signed)
Subjective:    Patient ID: Andrea Wilson, female    DOB: 01-12-2001, 22 y.o.   MRN: [id number]  Poison Ivy Pertinent negatives include no shortness of breath.    Andrea Wilson is a very pleasant 22 y.o. female with a history of chronic headache, chronic cystitis, excessive sweating who presents today to discuss rash.  Symptom onset 3-4 days ago with a rash to the right 4th lateral finger. The rash then spread to bilateral lower abdomen and bilateral thighs about 1-2 days ago. She has washed her linens and clothing.   She's applied Cortisone spray and calamine lotion with temporary improvement. Overall she feels that the rash is spreading.  Prior to symptom onset she was surrounded by poison oak as she is building a [employer].    Review of Systems  Respiratory:  Negative for shortness of breath and wheezing.   Skin:  Positive for rash.         Past Medical History:  Diagnosis Date   Allergy    Anemia    during pregnancy   Anxiety    Axillary pain, left 06/16/2020   Chest pain 08/23/2018   COVID 12/2020   and October 2021  both mild cases   Depression    Fatigue 08/03/2021   Frequent headaches    Hypotension    Postpartum hemorrhage 03/26/2022   Sensation of foreign body in throat 08/17/2021   SVD (spontaneous vaginal delivery) 03/26/2022    Social History   Socioeconomic History   Marital status: Single    Spouse name: Not on file   Number of children: Not on file   Years of education: Not on file   Highest education level: Not on file  Occupational History   Not on file  Tobacco Use   Smoking status: Never   Smokeless tobacco: Never  Vaping Use   Vaping Use: Never used  Substance and Sexual Activity   Alcohol use: No   Drug use: No   Sexual activity: Not Currently  Other Topics Concern   Not on file  Social History Narrative   Lives with mother and brother. Her sister passed away in 2013/07/04 at age 11 from cancer.   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     Social Determinants of Health   Financial Resource Strain: Not on file  Food Insecurity: Not on file  Transportation Needs: Not on file  Physical Activity: Not on file  Stress: Not on file  Social Connections: Not on file  Intimate Partner Violence: Not on file    Past Surgical History:  Procedure Laterality Date   DILATION AND EVACUATION  04/13/2022   Procedure: DILATATION AND EVACUATION;  Surgeon: Huel Cote, MD;  Location: [facility] OR;  Service: Gynecology;;  LMA Anesthesia   KNEE SURGERY     TOOTH EXTRACTION      Family History  Problem Relation Age of Onset   Hyperlipidemia Mother    Anxiety disorder Mother    Depression Mother    Hypertension Father    Cancer Sister    Uterine cancer Maternal Grandmother    Lung cancer Paternal Grandfather    Migraines Other        Mfhx of migraine   Seizures Cousin    Autism Cousin     No Known Allergies  Current Outpatient Medications on File Prior to Visit  Medication Sig Dispense Refill   acetaminophen (TYLENOL) 325 MG tablet Take 2 tablets (650 mg total) by mouth every  6 (six) hours as needed (for pain scale < 4).     hydrOXYzine (ATARAX) 25 MG tablet Take 25 mg by mouth daily as needed.     ibuprofen (ADVIL) 100 MG/5ML suspension Take 30 mLs (600 mg total) by mouth every 6 (six) hours as needed. 946 mL 3   propranolol (INDERAL) 20 MG tablet Take 1 tablet (20 mg total) by mouth at bedtime. For headache prevention 90 tablet 2   sertraline (ZOLOFT) 50 MG tablet Take 1/2 tablet by mouth daily for 2 weeks, then increase to 1 tablet daily thereafter for anxiety and depression 90 tablet 0   venlafaxine XR (EFFEXOR-XR) 37.5 MG 24 hr capsule Take 1 capsule (37.5 mg total) by mouth daily with breakfast. For anxiety and depression. Follow weaning instructions. 90 capsule 0   No current facility-administered medications on file prior to visit.    BP 128/82   Pulse 71   Temp (!) 97.1 F (36.2 C) (Temporal)   Ht 5\' 5"  (1.651 m)    Wt 130 lb (59 kg)   SpO2 98%   Breastfeeding No   BMI 21.63 kg/m  Objective:   Physical Exam Constitutional:      General: She is not in acute distress. Skin:    General: Skin is warm and dry.     Findings: Rash present.     Comments: Vesicle to the right 4th finger.  Few mildly red spots to bilateral thighs and lower abdomen.           Assessment & Plan:  Poison oak dermatitis Assessment & Plan: Exam today representative of a mild case.  IM Depo-Medrol 80 mg provided today.  Continue cortisone spray as needed.  She will update if symptoms do not improve, or return after improvement.  At that point we need to consider prednisone prescription.         Doreene Nest, NP

## 2023-06-08 DIAGNOSIS — L237 Allergic contact dermatitis due to plants, except food: Secondary | ICD-10-CM

## 2023-06-09 MED ORDER — PREDNISONE 20 MG PO TABS: ORAL_TABLET | ORAL | 0 refills | Status: DC

## 2023-06-15 ENCOUNTER — Ambulatory Visit (INDEPENDENT_AMBULATORY_CARE_PROVIDER_SITE_OTHER): Payer: 59 | Admitting: Primary Care

## 2023-06-15 ENCOUNTER — Encounter: Payer: Self-pay | Admitting: Primary Care

## 2023-06-15 VITALS — BP 110/62 | HR 97 | Temp 98.6°F | Ht 65.0 in | Wt 132.0 lb

## 2023-06-15 DIAGNOSIS — R233 Spontaneous ecchymoses: Secondary | ICD-10-CM

## 2023-06-15 NOTE — Addendum Note (Signed)
Addended by: Alvina Chou on: 06/15/2023 12:38 PM   Modules accepted: Orders

## 2023-06-15 NOTE — Progress Notes (Signed)
Subjective:    Patient ID: Andrea Wilson, female    DOB: [birth year], 22 y.o.   MRN: [id number]  HPI  Andrea Wilson is a very pleasant 22 y.o. female with a history of migraines, preeclampsia during pregnancy, anxiety and depression who presents today to discuss bruising.  Her boyfriend joins [place] today.  She began to notice easy bruising to the right upper thigh about 2-3 weeks ago, after the Depo Medrol 80 mg injection she received in our clinic, and before she began the prednisone course for her poison oak. Since then she's noticed an increased number of bruising to the bilateral anterior and lateral thighs, bilateral calves.   She denies bruising to her upper extremities or elsewhere to her body. She denies changes to her diet, playing sports, unexplained weight loss, hair loss, family history of blood disorders, consumption of NSAID medications.    Review of Systems  Constitutional:  Negative for unexpected weight change.  Respiratory:  Negative for shortness of breath.   Cardiovascular:  Negative for palpitations.  Gastrointestinal:  Negative for blood in stool.  Genitourinary:  Negative for hematuria.  Skin:  Negative for rash.  Hematological:  Bruises/bleeds easily.         Past Medical History:  Diagnosis Date   Allergy    Anemia    during pregnancy   Anxiety    Axillary pain, left 06/16/2020   Chest pain 08/23/2018   COVID 12/2020   and October 2021  both mild cases   Depression    Fatigue 08/03/2021   Frequent headaches    Hypotension    Postpartum hemorrhage 03/26/2022   Sensation of foreign body in throat 08/17/2021   SVD (spontaneous vaginal delivery) 03/26/2022    Social History   Socioeconomic History   Marital status: Single    Spouse name: Not on file   Number of children: Not on file   Years of education: Not on file   Highest education level: Not on file  Occupational History   Not on file  Tobacco Use   Smoking status: Never   Smokeless  tobacco: Never  Vaping Use   Vaping status: Never Used  Substance and Sexual Activity   Alcohol use: No   Drug use: No   Sexual activity: Not Currently  Other Topics Concern   Not on file  Social History Narrative   Lives with mother and brother. Her sister passed away in July 01, 2013 at age 70 from cancer.   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    Social Determinants of Health   Financial Resource Strain: Not on file  Food Insecurity: Not on file  Transportation Needs: Not on file  Physical Activity: Not on file  Stress: Not on file  Social Connections: Not on file  Intimate Partner Violence: Not on file    Past Surgical History:  Procedure Laterality Date   DILATION AND EVACUATION  04/13/2022   Procedure: DILATATION AND EVACUATION;  Surgeon: Huel Cote, MD;  Location: [facility] OR;  Service: Gynecology;;  LMA Anesthesia   KNEE SURGERY     TOOTH EXTRACTION      Family History  Problem Relation Age of Onset   Hyperlipidemia Mother    Anxiety disorder Mother    Depression Mother    Hypertension Father    Cancer Sister    Uterine cancer Maternal Grandmother    Lung cancer Paternal Grandfather    Migraines Other        Mfhx of migraine  Seizures Cousin    Autism Cousin     No Known Allergies  Current Outpatient Medications on File Prior to Visit  Medication Sig Dispense Refill   acetaminophen (TYLENOL) 325 MG tablet Take 2 tablets (650 mg total) by mouth every 6 (six) hours as needed (for pain scale < 4).     hydrOXYzine (ATARAX) 25 MG tablet Take 25 mg by mouth daily as needed.     ibuprofen (ADVIL) 100 MG/5ML suspension Take 30 mLs (600 mg total) by mouth every 6 (six) hours as needed. 946 mL 3   propranolol (INDERAL) 20 MG tablet Take 1 tablet (20 mg total) by mouth at bedtime. For headache prevention 90 tablet 2   sertraline (ZOLOFT) 50 MG tablet Take 1/2 tablet by mouth daily for 2 weeks, then increase to 1 tablet daily thereafter for anxiety and depression 90 tablet 0    venlafaxine XR (EFFEXOR-XR) 37.5 MG 24 hr capsule Take 1 capsule (37.5 mg total) by mouth daily with breakfast. For anxiety and depression. Follow weaning instructions. 90 capsule 0   No current facility-administered medications on file prior to visit.    BP 110/62   Pulse 97   Temp 98.6 F (37 C) (Temporal)   Ht 5\' 5"  (1.651 m)   Wt 132 lb (59.9 kg)   SpO2 (!) 62%   BMI 21.97 kg/m  Objective:   Physical Exam Cardiovascular:     Rate and Rhythm: Normal rate and regular rhythm.  Pulmonary:     Effort: Pulmonary effort is normal.     Breath sounds: Normal breath sounds.  Musculoskeletal:     Cervical back: Neck supple.  Skin:    General: Skin is warm and dry.     Comments: Several bruises noted to right anterior thigh, dark blue/gray in color.  Few bruises noted to lower portion of lower extremities and left anterior thigh.  No bruising noted elsewhere on her body.           Assessment & Plan:  Easy bruising Assessment & Plan: Unclear etiology. Reviewed side effects from prednisone which do include bruising.  Given that she had both intramuscular steroids followed by an oral steroid taper, this makes sense. No alarm signs on exam or during HPI  Checking labs today including CBC with differential and pathology smear. Await results.  Consider hematology referral if bruising persists.   Orders: -     CBC with Differential/Platelet -     Pathologist smear review        Doreene Nest, NP

## 2023-06-15 NOTE — Patient Instructions (Signed)
Stop by the lab prior to leaving today. I will notify you of your results once received.   It was a pleasure to see you today!  

## 2023-06-15 NOTE — Assessment & Plan Note (Signed)
Unclear etiology. Reviewed side effects from prednisone which do include bruising.  Given that she had both intramuscular steroids followed by an oral steroid taper, this makes sense. No alarm signs on exam or during HPI  Checking labs today including CBC with differential and pathology smear. Await results.  Consider hematology referral if bruising persists.

## 2023-06-16 LAB — CBC WITH DIFFERENTIAL/PLATELET
Basophils Absolute: 0 10*3/uL (ref 0.0–0.2)
Basos: 1 %
EOS (ABSOLUTE): 0.1 10*3/uL (ref 0.0–0.4)
Eos: 1 %
Hematocrit: 36.1 % (ref 34.0–46.6)
Hemoglobin: 12 g/dL (ref 11.1–15.9)
Immature Grans (Abs): 0 10*3/uL (ref 0.0–0.1)
Immature Granulocytes: 0 %
Lymphocytes Absolute: 3.5 10*3/uL — ABNORMAL HIGH (ref 0.7–3.1)
Lymphs: 61 %
MCH: 30.2 pg (ref 26.6–33.0)
MCHC: 33.2 g/dL (ref 31.5–35.7)
MCV: 91 fL (ref 79–97)
Monocytes Absolute: 0.5 10*3/uL (ref 0.1–0.9)
Monocytes: 9 %
Neutrophils Absolute: 1.6 10*3/uL (ref 1.4–7.0)
Neutrophils: 28 %
Platelets: 295 10*3/uL (ref 150–450)
RBC: 3.98 x10E6/uL (ref 3.77–5.28)
RDW: 12.2 % (ref 11.7–15.4)
WBC: 5.7 10*3/uL (ref 3.4–10.8)

## 2023-06-19 ENCOUNTER — Other Ambulatory Visit: Payer: Self-pay | Admitting: Primary Care

## 2023-06-19 DIAGNOSIS — R233 Spontaneous ecchymoses: Secondary | ICD-10-CM

## 2023-06-19 LAB — PATHOLOGIST SMEAR REVIEW
Basophils Absolute: 0.1 10*3/uL (ref 0.0–0.2)
Basos: 1 %
EOS (ABSOLUTE): 0 10*3/uL (ref 0.0–0.4)
Eos: 1 %
Hematocrit: 35.8 % (ref 34.0–46.6)
Hemoglobin: 11.8 g/dL (ref 11.1–15.9)
Immature Grans (Abs): 0 10*3/uL (ref 0.0–0.1)
Immature Granulocytes: 0 %
Lymphocytes Absolute: 3.4 10*3/uL — ABNORMAL HIGH (ref 0.7–3.1)
Lymphs: 63 %
MCH: 29.7 pg (ref 26.6–33.0)
MCHC: 33 g/dL (ref 31.5–35.7)
MCV: 90 fL (ref 79–97)
Monocytes Absolute: 0.5 10*3/uL (ref 0.1–0.9)
Monocytes: 9 %
Neutrophils Absolute: 1.4 10*3/uL (ref 1.4–7.0)
Neutrophils: 26 %
Platelets: 309 10*3/uL (ref 150–450)
RBC: 3.97 x10E6/uL (ref 3.77–5.28)
RDW: 12 % (ref 11.7–15.4)
WBC: 5.4 10*3/uL (ref 3.4–10.8)

## 2023-07-18 ENCOUNTER — Other Ambulatory Visit: Payer: Self-pay | Admitting: Primary Care

## 2023-07-18 DIAGNOSIS — R519 Headache, unspecified: Secondary | ICD-10-CM

## 2023-09-01 ENCOUNTER — Other Ambulatory Visit: Payer: Self-pay

## 2023-09-01 DIAGNOSIS — F32A Depression, unspecified: Secondary | ICD-10-CM

## 2023-09-01 MED ORDER — SERTRALINE HCL 50 MG PO TABS
50.0000 mg | ORAL_TABLET | Freq: Every day | ORAL | 0 refills | Status: DC
Start: 2023-09-01 — End: 2024-05-07

## 2023-09-01 NOTE — Telephone Encounter (Signed)
Patient needs office visit scheduled for general follow-up and also follow-up of depression/anxiety.  Please schedule at her convenience.

## 2023-09-01 NOTE — Telephone Encounter (Signed)
Patient scheduled.

## 2023-09-12 ENCOUNTER — Ambulatory Visit: Payer: 59 | Admitting: Primary Care

## 2023-09-13 ENCOUNTER — Encounter: Payer: Self-pay | Admitting: Primary Care

## 2024-01-11 ENCOUNTER — Encounter: Payer: Self-pay | Admitting: Primary Care

## 2024-01-11 ENCOUNTER — Ambulatory Visit (INDEPENDENT_AMBULATORY_CARE_PROVIDER_SITE_OTHER): Payer: 59 | Admitting: Primary Care

## 2024-01-11 VITALS — BP 98/62 | HR 77 | Temp 97.2°F | Ht 65.0 in | Wt 138.0 lb

## 2024-01-11 DIAGNOSIS — L299 Pruritus, unspecified: Secondary | ICD-10-CM

## 2024-01-11 NOTE — Patient Instructions (Signed)
 Try taking cetirizine (Zyrtec) 10 mg at bedtime for itching/allergies.  Stop by the lab prior to leaving today. I will notify you of your results once received.   It was a pleasure to see you today!

## 2024-01-11 NOTE — Assessment & Plan Note (Signed)
 Likely secondary to animal dander.  Allergy  testing today for animal groups and alpha gal.  Start cetirizine 10 mg at bedtime.  Offered allergy  referral for skin prick testing, she declines today but will notify if she changes her mind.

## 2024-01-11 NOTE — Progress Notes (Signed)
 Subjective:    Patient ID: Andrea Wilson, female    DOB: 16-Sep-2001, 23 y.o.   MRN: [id number]  HPI  Andrea Wilson is a very pleasant 23 y.o. female with a history of poison oak dermatitis, chronic cystitis, anxiety depression, excessive sweating, migraines who presents today to discus pruritus.  Symptom onset about 2-3 months ago with itching from her head to her toes. Also with watery eyes, rhinorrhea, post nasal drip. At the time she had adopted a few stray cats, but this is the only change. She does notice itching when around her cats and also when around her grandmother's dog or visiting her grandmother's house.   She is having to change her clothes twice daily, changing her bed sheets weekly. The cats and dog will sleep with her or jump on her bed.  No new lotions, detergents, soaps or shampoos. No new medicines, vitamins, supplements. No recent outdoor exposure or poison ivy exposure. No bonfire or smoke exposure.  No recent motel or hotel stay or new beds.   No fevers/chills, oral lesions, new joint pains, tick bites, abdominal pain, nausea.   She's tried changing her laundry detergent to a more mild form without improvement. She doesn't eat much beef or pork. Mostly eats chicken. She denies chest tightness, wheezing, cough, itchy eyes.  Review of Systems  Constitutional:  Negative for chills and fever.  HENT:  Positive for postnasal drip and rhinorrhea. Negative for congestion and sneezing.   Eyes:  Negative for itching.       Watery eyes  Respiratory:  Negative for chest tightness and shortness of breath.   Skin:  Negative for rash.       Pruritis         Past Medical History:  Diagnosis Date   Allergy     Anemia    during pregnancy   Anxiety    Axillary pain, left 06/16/2020   Chest pain 08/23/2018   COVID 12/2020   and October 2021  both mild cases   Depression    Fatigue 08/03/2021   Frequent headaches    Hypotension    Pelvic cramping 08/17/2021    Postpartum hemorrhage 03/26/2022   Sensation of foreign body in throat 08/17/2021   SVD (spontaneous vaginal delivery) 03/26/2022    Social History   Socioeconomic History   Marital status: Single    Spouse name: Not on file   Number of children: Not on file   Years of education: Not on file   Highest education level: Not on file  Occupational History   Not on file  Tobacco Use   Smoking status: Never   Smokeless tobacco: Never  Vaping Use   Vaping status: Never Used  Substance and Sexual Activity   Alcohol use: No   Drug use: No   Sexual activity: Not Currently  Other Topics Concern   Not on file  Social History Narrative   Lives with mother and brother. Her sister passed away in 2013/11/12 at age 16 from cancer.   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    Social Drivers of Corporate Investment Banker Strain: Not on file  Food Insecurity: Low Risk  (10/10/2023)   Received from [facility]   Hunger Vital Sign    Worried About Running Out of Food in the Last Year: Never true    Ran Out of Food in the Last Year: Never true  Transportation Needs: No Transportation Needs (10/10/2023)   Received from [facility]  Transportation    In the past 12 months, has lack of reliable transportation kept you from medical appointments, meetings, work or from getting things needed for daily living? : No  Physical Activity: Not on file  Stress: Not on file  Social Connections: Unknown (07/06/2023)   Received from [facility]   Social Network    Social Network: Not on file  Intimate Partner Violence: Unknown (07/06/2023)   Received from [facility]   HITS    Physically Hurt: Not on file    Insult or Talk Down To: Not on file    Threaten Physical Harm: Not on file    Scream or Curse: Not on file    Past Surgical History:  Procedure Laterality Date   DILATION AND EVACUATION  04/13/2022   Procedure: DILATATION AND EVACUATION;  Surgeon: Estelle Service, MD;  Location: [facility] OR;  Service:  Gynecology;;  LMA Anesthesia   KNEE SURGERY     TOOTH EXTRACTION      Family History  Problem Relation Age of Onset   Hyperlipidemia Mother    Anxiety disorder Mother    Depression Mother    Hypertension Father    Cancer Sister    Uterine cancer Maternal Grandmother    Lung cancer Paternal Grandfather    Migraines Other        Mfhx of migraine   Seizures Cousin    Autism Cousin     No Known Allergies  Current Outpatient Medications on File Prior to Visit  Medication Sig Dispense Refill   acetaminophen  (TYLENOL ) 325 MG tablet Take 2 tablets (650 mg total) by mouth every 6 (six) hours as needed (for pain scale < 4).     hydrOXYzine  (ATARAX ) 25 MG tablet Take 25 mg by mouth daily as needed.     ibuprofen  (ADVIL ) 100 MG/5ML suspension Take 30 mLs (600 mg total) by mouth every 6 (six) hours as needed. 946 mL 3   propranolol  (INDERAL ) 20 MG tablet TAKE 1 TABLET(20 MG) BY MOUTH AT BEDTIME FOR HEADACHE PREVENTION 90 tablet 0   sertraline  (ZOLOFT ) 50 MG tablet Take 1 tablet (50 mg total) by mouth daily. for anxiety and depression. 90 tablet 0   No current facility-administered medications on file prior to visit.    BP 98/62   Pulse 77   Temp (!) 97.2 F (36.2 C) (Temporal)   Ht 5' 5 (1.651 m)   Wt 138 lb (62.6 kg)   SpO2 97%   Breastfeeding No   BMI 22.96 kg/m  Objective:   Physical Exam Cardiovascular:     Rate and Rhythm: Normal rate and regular rhythm.  Pulmonary:     Effort: Pulmonary effort is normal.     Breath sounds: Normal breath sounds.  Musculoskeletal:     Cervical back: Neck supple.  Skin:    General: Skin is warm and dry.     Findings: No rash.  Neurological:     Mental Status: She is alert and oriented to person, place, and time.  Psychiatric:        Mood and Affect: Mood normal.           Assessment & Plan:  Pruritus Assessment & Plan: Likely secondary to animal dander.  Allergy  testing today for animal groups and alpha gal.  Start  cetirizine 10 mg at bedtime.  Offered allergy  referral for skin prick testing, she declines today but will notify if she changes her mind.  Orders: -     Alpha-Gal Panel -  Allergy  Panel, Animal Group        Comer MARLA Gaskins, NP

## 2024-01-11 NOTE — Addendum Note (Signed)
 Addended by: Gerry Krone on: 01/11/2024 10:38 AM   Modules accepted: Orders

## 2024-01-13 LAB — ALPHA-GAL PANEL
Allergen Lamb IgE: <0.1 kU/L
Beef IgE: <0.1 kU/L
IgE (Immunoglobulin E), Serum: 6 [IU]/mL (ref 6–495)
O215-IgE Alpha-Gal: <0.1 kU/L
Pork IgE: <0.1 kU/L

## 2024-01-13 LAB — ALLERGY PANEL, ANIMAL GROUP
Chicken Feathers IgE: <0.1 kU/L
Cow Dander IgE: <0.1 kU/L
Goose Feathers IgE: <0.1 kU/L
Horse dander: <0.1 kU/L
Mouse Urine IgE: <0.1 kU/L

## 2024-01-14 DIAGNOSIS — L299 Pruritus, unspecified: Secondary | ICD-10-CM

## 2024-01-15 NOTE — Telephone Encounter (Signed)
 Terri or Camilo Cella,   Is there a lab test to check for allergies for dog and cat dander?  I recently ordered an animal allergy  profile which did not contain either.

## 2024-01-16 NOTE — Telephone Encounter (Signed)
Noted

## 2024-01-16 NOTE — Telephone Encounter (Signed)
Order Andrea Wilson, [id number]. Allergin profile Cat and Dog IGE with reflex

## 2024-01-17 ENCOUNTER — Other Ambulatory Visit (INDEPENDENT_AMBULATORY_CARE_PROVIDER_SITE_OTHER): Payer: 59

## 2024-01-17 DIAGNOSIS — R233 Spontaneous ecchymoses: Secondary | ICD-10-CM

## 2024-01-17 DIAGNOSIS — L299 Pruritus, unspecified: Secondary | ICD-10-CM | POA: Diagnosis not present

## 2024-01-19 DIAGNOSIS — L299 Pruritus, unspecified: Secondary | ICD-10-CM

## 2024-01-20 LAB — CBC WITH DIFFERENTIAL/PLATELET
Basophils Absolute: 0 10*3/uL (ref 0.0–0.2)
Basos: 1 %
EOS (ABSOLUTE): 0 10*3/uL (ref 0.0–0.4)
Eos: 1 %
Hematocrit: 40.4 % (ref 34.0–46.6)
Hemoglobin: 13.4 g/dL (ref 11.1–15.9)
Immature Grans (Abs): 0 10*3/uL (ref 0.0–0.1)
Immature Granulocytes: 0 %
Lymphocytes Absolute: 1.9 10*3/uL (ref 0.7–3.1)
Lymphs: 53 %
MCH: 29.3 pg (ref 26.6–33.0)
MCHC: 33.2 g/dL (ref 31.5–35.7)
MCV: 88 fL (ref 79–97)
Monocytes Absolute: 0.4 10*3/uL (ref 0.1–0.9)
Monocytes: 10 %
Neutrophils Absolute: 1.2 10*3/uL — ABNORMAL LOW (ref 1.4–7.0)
Neutrophils: 35 %
Platelets: 378 10*3/uL (ref 150–450)
RBC: 4.57 x10E6/uL (ref 3.77–5.28)
RDW: 11.8 % (ref 11.7–15.4)
WBC: 3.6 10*3/uL (ref 3.4–10.8)

## 2024-01-20 LAB — IGE CAT/DOG W/COMPONENT REFLEX
E001-IgE Cat Dander: <0.1 kU/L
E005-IgE Dog Dander: <0.1 kU/L

## 2024-02-12 ENCOUNTER — Encounter: Payer: Self-pay | Admitting: *Deleted

## 2024-05-06 ENCOUNTER — Other Ambulatory Visit: Payer: Self-pay

## 2024-05-06 DIAGNOSIS — F419 Anxiety disorder, unspecified: Secondary | ICD-10-CM

## 2024-05-07 ENCOUNTER — Ambulatory Visit (INDEPENDENT_AMBULATORY_CARE_PROVIDER_SITE_OTHER)
Admission: RE | Admit: 2024-05-07 | Discharge: 2024-05-07 | Disposition: A | Discharge status: Home / Self Care | Source: Ambulatory Visit | Attending: Primary Care | Admitting: Primary Care

## 2024-05-07 ENCOUNTER — Ambulatory Visit (INDEPENDENT_AMBULATORY_CARE_PROVIDER_SITE_OTHER): Admitting: Primary Care

## 2024-05-07 ENCOUNTER — Encounter: Payer: Self-pay | Admitting: Primary Care

## 2024-05-07 VITALS — BP 126/66 | HR 84 | Temp 97.7°F | Ht 65.0 in | Wt 137.0 lb

## 2024-05-07 DIAGNOSIS — G8929 Other chronic pain: Secondary | ICD-10-CM | POA: Diagnosis not present

## 2024-05-07 DIAGNOSIS — Z0000 Encounter for general adult medical examination without abnormal findings: Secondary | ICD-10-CM

## 2024-05-07 DIAGNOSIS — G43009 Migraine without aura, not intractable, without status migrainosus: Secondary | ICD-10-CM

## 2024-05-07 DIAGNOSIS — M25552 Pain in left hip: Secondary | ICD-10-CM

## 2024-05-07 DIAGNOSIS — F419 Anxiety disorder, unspecified: Secondary | ICD-10-CM

## 2024-05-07 DIAGNOSIS — R638 Other symptoms and signs concerning food and fluid intake: Secondary | ICD-10-CM | POA: Diagnosis not present

## 2024-05-07 DIAGNOSIS — F32A Depression, unspecified: Secondary | ICD-10-CM

## 2024-05-07 DIAGNOSIS — Z0001 Encounter for general adult medical examination with abnormal findings: Secondary | ICD-10-CM

## 2024-05-07 LAB — POCT URINE PREGNANCY: Preg Test, Ur: NEGATIVE

## 2024-05-07 MED ORDER — SERTRALINE HCL 50 MG PO TABS: 50.0000 mg | ORAL_TABLET | Freq: Every day | ORAL | 3 refills | Status: DC

## 2024-05-07 NOTE — Assessment & Plan Note (Signed)
 Controlled.  Continue to monitor. Continue propranolol  20 mg HS for prevention.

## 2024-05-07 NOTE — Progress Notes (Signed)
 Subjective:    Patient ID: Andrea Wilson, female    DOB: [birth year], 23 y.o.   MRN: [id number]  Hip Pain  Pertinent negatives include no numbness.  Depression        Associated symptoms include no headaches.   Andrea Wilson is a very pleasant 23 y.o. female who presents today for complete physical and follow up of chronic conditions.  She would also like to discuss several concerns.   1) Food Craving: Has been craving jalapenos for the last week, is worried about pregnancy.  She does have an IUD for which has been in place since May 17, 2022. She denies a salt craving, has just wanted the spicy flavor.   2) Hip Pain: Chronic and intermittent for years, more recently she's noticed more frequent pain. Symptoms occur when leaning on the hip, laying on the hip, sitting the "wrong way", hip flexion. She's noticed the hip "giving out" with certain movements. 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 In high school she did have an injury where she landed on her left hip, this was after evaluation of her left hip pain. Her pain does radiate down her left lower extremity, denies numbness, loss of bowel/bladder control.   Immunizations: -Tetanus: Completed in 05-17-22 -HPV: Completed series   Diet: Fair diet.  Exercise: No regular exercise.  Eye exam: Completed last year  Dental exam: Completed years ago   Pap Smear: Completed in May 17, 2022  BP Readings from Last 3 Encounters:  05/07/24 126/66  01/11/24 98/62  06/15/23 110/62      Review of Systems  Constitutional:  Negative for unexpected weight change.  HENT:  Negative for rhinorrhea.   Respiratory:  Negative for cough and shortness of breath.   Cardiovascular:  Negative for chest pain.  Gastrointestinal:  Negative for constipation and diarrhea.  Genitourinary:  Negative for difficulty urinating and menstrual problem.  Musculoskeletal:  Positive for arthralgias.  Skin:  Negative for rash.   Allergic/Immunologic: Negative for environmental allergies.  Neurological:  Negative for dizziness, numbness and headaches.  Psychiatric/Behavioral:  Positive for depression. The patient is not nervous/anxious.          Past Medical History:  Diagnosis Date   Allergy     Anemia    during pregnancy   Anxiety    Axillary pain, left 06/16/2020   Chest pain 08/23/2018   COVID 12/2020   and October 2021  both mild cases   Depression    Fatigue 08/03/2021   Frequent headaches    Hypotension    Pelvic cramping 08/17/2021   Poison oak dermatitis 06/07/2023   Postpartum hemorrhage 03/26/2022   Sensation of foreign body in throat 08/17/2021   SVD (spontaneous vaginal delivery) 03/26/2022    Social History   Socioeconomic History   Marital status: Single    Spouse name: Not on file   Number of children: Not on file   Years of education: Not on file   Highest education level: Not on file  Occupational History   Not on file  Tobacco Use   Smoking status: Never   Smokeless tobacco: Never  Vaping Use   Vaping status: Never Used  Substance and Sexual Activity   Alcohol use: No   Drug use: No   Sexual activity: Not Currently  Other Topics Concern   Not on file  Social History Narrative   Lives with mother and brother. Her sister passed away in 17-May-2013 at age 74 from cancer.  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    Social Drivers of Corporate investment banker Strain: Not on file  Food Insecurity: Low Risk  (01/25/2024)   Received from [facility]   Hunger Vital Sign    Worried About Running Out of Food in the Last Year: Never true    Ran Out of Food in the Last Year: Never true  Transportation Needs: No Transportation Needs (01/25/2024)   Received from [facility]    In the past 12 months, has lack of reliable transportation kept you from medical appointments, meetings, work or from getting things needed for daily living? : No  Physical Activity: Not on  file  Stress: Not on file  Social Connections: Unknown (07/06/2023)   Received from [facility]   Social Network    Social Network: Not on file  Intimate Partner Violence: Unknown (07/06/2023)   Received from [facility]   HITS    Physically Hurt: Not on file    Insult or Talk Down To: Not on file    Threaten Physical Harm: Not on file    Scream or Curse: Not on file    Past Surgical History:  Procedure Laterality Date   DILATION AND EVACUATION  04/13/2022   Procedure: DILATATION AND EVACUATION;  Surgeon: Rogene Claude, MD;  Location: [facility] OR;  Service: Gynecology;;  LMA Anesthesia   KNEE SURGERY     TOOTH EXTRACTION      Family History  Problem Relation Age of Onset   Hyperlipidemia Mother    Anxiety disorder Mother    Depression Mother    Hypertension Father    Cancer Sister    Uterine cancer Maternal Grandmother    Lung cancer Paternal Grandfather    Migraines Other        Mfhx of migraine   Seizures Cousin    Autism Cousin     No Known Allergies  Current Outpatient Medications on File Prior to Visit  Medication Sig Dispense Refill   acetaminophen  (TYLENOL ) 325 MG tablet Take 2 tablets (650 mg total) by mouth every 6 (six) hours as needed (for pain scale < 4).     hydrOXYzine  (ATARAX ) 25 MG tablet Take 25 mg by mouth daily as needed.     ibuprofen  (ADVIL ) 100 MG/5ML suspension Take 30 mLs (600 mg total) by mouth every 6 (six) hours as needed. 946 mL 3   propranolol  (INDERAL ) 20 MG tablet TAKE 1 TABLET(20 MG) BY MOUTH AT BEDTIME FOR HEADACHE PREVENTION 90 tablet 0   No current facility-administered medications on file prior to visit.    BP 126/66   Pulse 84   Temp 97.7 F (36.5 C) (Temporal)   Ht 5\' 5"  (1.651 m)   Wt 137 lb (62.1 kg)   SpO2 97%   BMI 22.80 kg/m  Objective:   Physical Exam HENT:     Right Ear: Tympanic membrane and ear canal normal.     Left Ear: Tympanic membrane and ear canal normal.  Eyes:     Pupils: Pupils are equal, round, and  reactive to light.  Cardiovascular:     Rate and Rhythm: Normal rate and regular rhythm.  Pulmonary:     Effort: Pulmonary effort is normal.     Breath sounds: Normal breath sounds.  Abdominal:     General: Bowel sounds are normal.     Palpations: Abdomen is soft.     Tenderness: There is no abdominal tenderness.  Musculoskeletal:  General: Normal range of motion.     Cervical back: Neck supple.     Left hip: Normal range of motion. Normal strength.     Comments: Pain to left hip with internal rotation while supine   Skin:    General: Skin is warm and dry.  Neurological:     Mental Status: She is alert and oriented to person, place, and time.     Cranial Nerves: No cranial nerve deficit.     Deep Tendon Reflexes:     Reflex Scores:      Patellar reflexes are 2+ on the right side and 2+ on the left side. Psychiatric:        Mood and Affect: Mood normal.           Assessment & Plan:  Encounter for annual general medical examination with abnormal findings in adult Assessment & Plan: Immunizations UTD. Pap smear UTD.  Discussed the importance of a healthy diet and regular exercise in order for weight loss, and to reduce the risk of further co-morbidity.  Exam stable. Labs pending.  Follow up in 1 year for repeat physical.    Anxiety and depression Assessment & Plan: Controlled.   Continue Zoloft  50 mg daily. Refills provided.   Orders: -     Sertraline  HCl; Take 1 tablet (50 mg total) by mouth daily. for anxiety and depression.  Dispense: 90 tablet; Refill: 3  Migraine without aura and without status migrainosus, not intractable Assessment & Plan: Controlled.  Continue to monitor. Continue propranolol  20 mg HS for prevention.    Chronic nonintractable headache, unspecified headache type Assessment & Plan: Controlled.  Continue propranolol  20 mg nightly for prevention.    Craving for particular food Assessment & Plan: Urine pregnancy test  negative today.  Checking labs today to rule out electrolyte, thyroid , or blood count imbalance.   Orders: -     CBC -     Comprehensive metabolic panel with GFR -     TSH  Chronic left hip pain Assessment & Plan: No alarm signs on exam. X-ray of the left hip ordered and pending.  Consider physical therapy versus orthopedic referral. Await results.  Orders: -     DG HIP UNILAT W OR W/O PELVIS 2-3 VIEWS LEFT        Gabriel John, NP

## 2024-05-07 NOTE — Assessment & Plan Note (Signed)
 Urine pregnancy test negative today.  Checking labs today to rule out electrolyte, thyroid , or blood count imbalance.

## 2024-05-07 NOTE — Assessment & Plan Note (Signed)
 Controlled.  Continue propranolol  20 mg nightly for prevention.

## 2024-05-07 NOTE — Assessment & Plan Note (Signed)
 No alarm signs on exam. X-ray of the left hip ordered and pending.  Consider physical therapy versus orthopedic referral. Await results.

## 2024-05-07 NOTE — Addendum Note (Signed)
 Addended by: Gerry Krone on: 05/07/2024 11:29 AM   Modules accepted: Orders

## 2024-05-07 NOTE — Patient Instructions (Signed)
Complete xray(s) and labs prior to leaving today. I will notify you of your results once received.  It was a pleasure to see you today!

## 2024-05-07 NOTE — Assessment & Plan Note (Signed)
Immunizations UTD. Pap smear UTD  Discussed the importance of a healthy diet and regular exercise in order for weight loss, and to reduce the risk of further co-morbidity.  Exam stable. Labs pending.  Follow up in 1 year for repeat physical.  

## 2024-05-07 NOTE — Assessment & Plan Note (Signed)
 Controlled.   Continue Zoloft  50 mg daily. Refills provided.

## 2024-05-08 ENCOUNTER — Ambulatory Visit: Payer: Self-pay | Admitting: Primary Care

## 2024-05-08 LAB — CBC
Hematocrit: 40.3 % (ref 34.0–46.6)
Hemoglobin: 13.3 g/dL (ref 11.1–15.9)
MCH: 30 pg (ref 26.6–33.0)
MCHC: 33 g/dL (ref 31.5–35.7)
MCV: 91 fL (ref 79–97)
Platelets: 279 10*3/uL (ref 150–450)
RBC: 4.43 x10E6/uL (ref 3.77–5.28)
RDW: 11.6 % — ABNORMAL LOW (ref 11.7–15.4)
WBC: 4.3 10*3/uL (ref 3.4–10.8)

## 2024-05-08 LAB — COMPREHENSIVE METABOLIC PANEL WITH GFR
ALT: 9 IU/L (ref 0–32)
AST: 13 IU/L (ref 0–40)
Albumin: 4.8 g/dL (ref 4.0–5.0)
Alkaline Phosphatase: 69 IU/L (ref 44–121)
BUN/Creatinine Ratio: 16 (ref 9–23)
BUN: 11 mg/dL (ref 6–20)
Bilirubin Total: 0.3 mg/dL (ref 0.0–1.2)
CO2: 21 mmol/L (ref 20–29)
Calcium: 9.5 mg/dL (ref 8.7–10.2)
Chloride: 105 mmol/L (ref 96–106)
Creatinine, Ser: 0.7 mg/dL (ref 0.57–1.00)
Globulin, Total: 2.4 g/dL (ref 1.5–4.5)
Glucose: 73 mg/dL (ref 70–99)
Potassium: 4.5 mmol/L (ref 3.5–5.2)
Sodium: 140 mmol/L (ref 134–144)
Total Protein: 7.2 g/dL (ref 6.0–8.5)
eGFR: 125 mL/min/{1.73_m2} (ref >59)

## 2024-05-08 LAB — TSH: TSH: 0.7 u[IU]/mL (ref 0.450–4.500)

## 2024-06-05 DIAGNOSIS — F32A Depression, unspecified: Secondary | ICD-10-CM

## 2024-06-11 ENCOUNTER — Other Ambulatory Visit: Payer: Self-pay

## 2024-06-11 DIAGNOSIS — R519 Headache, unspecified: Secondary | ICD-10-CM

## 2024-06-11 MED ORDER — PROPRANOLOL HCL 20 MG PO TABS: 20.0000 mg | ORAL_TABLET | Freq: Every day | ORAL | 2 refills | Status: DC

## 2024-07-05 HISTORY — PX: HIP ARTHROSCOPY: SUR88

## 2024-07-15 ENCOUNTER — Encounter: Payer: Self-pay | Admitting: Clinical

## 2024-07-15 ENCOUNTER — Ambulatory Visit: Admitting: Clinical

## 2024-07-15 DIAGNOSIS — Z91199 Patient's noncompliance with other medical treatment and regimen due to unspecified reason: Secondary | ICD-10-CM

## 2024-07-15 NOTE — Progress Notes (Signed)
 No Show for appointment on 07/15/2024.  A user error has taken place: encounter opened in error, closed for administrative reason  1200 TC to patient and she reported she has a lot going on and forgot about appt.  This Clinician informed her that she can call for another appointment when she's ready since she reported there's a lot happening with her right now.  She acknowledged understanding.  Adlene Adduci SHAUNNA Pouch, LCSW

## 2024-07-15 NOTE — BH OP Treatment Plan (Unsigned)
 A user error has taken place: encounter opened in error, closed for administrative reasons.

## 2024-07-15 NOTE — Progress Notes (Deleted)
                Cailean Heacock SHAUNNA Pouch, LCSW

## 2024-07-16 NOTE — Telephone Encounter (Signed)
 Kelli, will you fax off her letter? Placed in your inbox.

## 2024-07-16 NOTE — Addendum Note (Signed)
 Addended by: KENDELL ALDON PARAS on: 07/16/2024 10:06 AM  Modules accepted: Orders

## 2024-07-16 NOTE — Telephone Encounter (Signed)
 Letter faxed and received confirmation that it went through.

## 2024-07-17 NOTE — Progress Notes (Signed)
 History & Physical   Chief Complaint: left hip pain.  HPI: Name Andrea Wilson  Age 23 y.o.  Sex female  [address] [place], [place]  Side left  Location [place]  Pain began 07/2023  C-sign negative  Injury negative  DOI   Mechanical Sx Equivocal wo pain        LBP positive - LEFT  Radiculopathy negative     Pain with Sitting positive  Pain with Car positive  Pain with Walking positive  Pain with Arising positive  Pain with Crossing Legs positive  Pain at Night equivocal  Pain with Shoes/Socks positive  Pain with Pivoting positive  Coxa amora not tested  Pain with Valsalva negative  Pain with MC negative  Pain with Inc Activity positive  Pain with Running positive     Most painful activity run     POs   NSAIDs positive  Relief negative  Steroids negative  Relief not tested  Narcotics negative  Relief not tested     Injections   Intraarticular negative  Relief not tested  Extraarticular negative  Relief not tested  L-Spine negative  Relief not tested     Therapy   PT positive - HEP  Relief negative  Chiropractic negative  Relief not tested  Accupuncture negative  Relief not tested  Massage negative  Relief not tested     PSH As per below  Hip Arthroscopy Primary   Hip Arthroscopy Rev   Knee Arthroscopy S/p bil knee arthroscopy J Frino  Shoulder Arthroscopy   L Spine Surgery   C-Section         PMH As per below     PAH softball     Occupation Works at golf [employer]     Children x1     In clinic with mother and BF     Providers Seen JINNY Currie; DELENA Bailey     PT        Patient Active Problem List   Diagnosis Date Noted  . Chronic cystitis 01/30/2019  . Left hamstring strain, initial encounter 02/15/2018  . Contusion of left knee 02/15/2018  . Trochanteric bursitis of left hip 06/20/2017  . 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 Knee pain, bilateral 11/25/2014   [Medical History]   [Medical History] Past Medical History Diagnosis  Date  . Anxiety and depression   . Migraine   . 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 Urinary tract infection    [Surgical History]   [Surgical History] Past Surgical History Procedure Laterality Date  . KNEE ARTHROSCOPY Bilateral 03/10/2016   Procedure: KNEE ARTHROSCOPY;  Surgeon: Norleen Currie, MD;  Location: [facility] OR;  Service: Orthopedics;  Laterality: Bilateral;   [Prescriptions Prior to Admission]  [Prescriptions Prior to Admission] (Not in a hospital admission) [Allergies]   [Allergies] Allergen Reactions  . Prednisone  Other (See Comments)    bruises   Social History   Tobacco Use  . Smoking status: Never  . Smokeless tobacco: Never  Substance Use Topics  . Alcohol use: Yes    Comment: occ    [Family History]  [Family History] Problem Relation Name Age of Onset  . Anesthesia problems Sister    . Scoliosis Cousin    . Cerebral palsy Neg Hx    . Club foot Neg Hx    . Gait disorder Neg Hx    . Hip dysplasia Neg Hx    . Spina bifida Neg Hx  Review of Systems A 14-point ROS was performed with pertinent positives/negatives noted in the HPI. The remainder of the ROS are negative.   Physical Examination:  HEENT:  Head  Appearance: symmetric with no discoloration, masses, tenderness or edema  Eyes  External: conjunctivae and lids normal  ENT  External ears: normal, no lesions or deformities External nose: normal, no lesions or deformities Hearing: grossly intact  Throat  Neck: supple, no masses, trachea midline  Chest/Respiratory  Chest appearance: symmetric without pectus excavatum or pectus carinatum Respiratory effort: no intercostal retractions or use of accessory muscles  Heart  Palpation: normal without thrills  Gastrointestinal  Abdomen: soft, non-tender, no masses, bladder not distended  Breasts  Breast inspection: deferred Breast palpation: deferred  Genitourinary  External genitalia: deferred Urethra:  deferred Bladder: deferred Cervix: deferred Uterus: deferred Adnexa: deferred  Constitutional General Appearance: well nourished, well hydrated, no acute distress, appropriate for age.  Skin  Inspection: no rashes, lesions, or ulcerations  Neurologic  Cranial nerves: grossly intact Reflexes: no pathological reflexes Sensation: intact to touch Coordination: normal  Mental Status Exam  Judgment, insight: intact Orientation: oriented to time, place, and person Memory: intact for recent and remote events Mood and affect: no depression, anxiety, or agitation  Vascular  Peripheral circulation: warm with good refill and turgor  Lymphatic  Lymphatics: no lymphadenopathy  Gait and Station  Gait: See Below Station: See Below  Head, Neck and Cervical Spine  Inspection: normocephalic, normal cervical contour Palpation: no masses or tenderness Stability: stable during active or passive ROM ROM: functional Motor: power all groups Sensation: normal  Spine, Ribs and Pelvis  Inspection/Skin: See Below Palpation/Percussion: See Below ROM: See Below Stability: See Below Motor: See Below  MUSCULOSKELETAL EXAM:   Lumbar Spine & Pelvis   Tenderness: negative     Range of Motion:     Flexion: Normal     Extension: Decreased mild     Motor Normal  Sensation: Normal  Vascular Normal  Lymphatic Normal     Gait: cox moderate     Leg Lengths equal     Marshall Laxity Grade      RIGHT HIP   Tenderness:   Sacroiliac: equivocal  Greater Trochanteric: negative     Range of Motion:    Terminal Flexion: 105 degrees    Internal Rotation @ 90 10 degrees  FABER: 2 Fists     Hip Flexion SLRST Grade 5  SLRRST negative  IPST negative        FADIR negative  EABER negative  GROIN negative  Adductor Longus negative     Prone Exam   Quadriceps ST 1 Fists  IR @ 0 Neutral 40 degrees  IR @ 0 Abduction 20 degrees     Sciatic Compartment   IFDST negative  PACE    [place]   HS Origin negative  Pudendal Space   PJLT negative  LR end point     degrees     Trendelenburg negative     CMI Assessment   RACEST not tested     Abductor Assessment   OBER TEST     Fists  SLR Decubitus Grade          LEFT HIP   Tenderness:   Sacroiliac: positive  Greater Trochanteric: positive     Range of Motion:    Terminal Flexion: 105 degrees    Internal Rotation @ 90 10 degrees  FABER: 5 Fists     Hip Flexion SLRST Grade 4+  SLRRST negative  IPST negative        FADIR positive  EABER positive  GROIN positive  Adductor Longus negative     Prone Exam   Quadriceps ST 1 Fists  IR @ 0 Neutral 40 degrees  IR @ 0 Abduction 20 degrees     Sciatic Compartment   IFDST negative  PACE   [place]   HS Origin negative  Pudendal Space   PJLT positive  LR end point     degrees     Trendelenburg positive     CMI Assessment   RACEST not tested     Abductor Assessment   OBER TEST     Fists  SLR Decubitus Grade         RADIOLOGY:  left HIP   Tonnis Grade 0     Acetabular Metrics Degrees  [place] LCE 21  Sharp 47  ACE 23  MPFA   Tonnis 6  Neck Shaft 126     Acetabular Morphology   Retroversion negative  Coxa profunda negative  Coxa protrusio negative  Posterior Wall Sign negative     Sabre Tooth Sign negative  Sea Gull Sign negative  Hammock Sign negative     Os acetabuli   Osteophyte(s)   Cyst(s)    Crowe I/II        Femur Morphology   Anterior CAM positive  Alpha angle > 50 deg positive - 71  Pincer Groove negative  Coxa breva negative  Coxa Magna negative  Coxa vara positive  Osteophyte(s)   Cyst(s)            left MRI LIMITED FOV  Date 06/20/2024  Arthrogram negative        Acetabulum   Labral tear positive  Chondromalacia negative   Subchondral Cyst negative  Paralabral Cyst negative        Proximal Femur   Chondromalacia negative   Subchondral Cysts negative     Abductor Insertion normal  other  Decreased ant wall vol  Acet ante    A: Left hip pain   Plan: I reviewed with Jannetta the clinical and radiographic evaluation to this point. We discussed nonoperative and operative treatment options. We discussed the risks, benefits, and alternatives of open versus arthroscopic left hip surgery. The risks of surgery (infection, blood clot, nerve or vessel damage, worsening of degeneration, AVN, fracture, heterotopic ossificans) as well as the option to continue conservative management were discussed.  The rehabilitation course, including up to 8 weeks on crutches and 4-6 months of post-operative rehabilitation were reviewed with the patient. The patient should plan for about 6 weeks out of full-time work. Informed consent was performed including use of orthobiologics.  Based on our discussion, we will proceed with a left hip arthroscopy.  Time 45 min More than 50% of the patient encounter was spent in counseling and coordination of care including but not limited to discussion on nonoperative and operative treatment options, pain management options, therapy options, and additional opinions on treatment and care decisions.  Aldon DOROTHA Hopping, MD

## 2024-09-12 ENCOUNTER — Encounter: Payer: Self-pay | Admitting: Primary Care

## 2024-09-12 ENCOUNTER — Ambulatory Visit (INDEPENDENT_AMBULATORY_CARE_PROVIDER_SITE_OTHER): Admitting: Primary Care

## 2024-09-12 VITALS — BP 128/82 | HR 96 | Temp 97.4°F | Ht 65.0 in | Wt 137.0 lb

## 2024-09-12 DIAGNOSIS — F32A Depression, unspecified: Secondary | ICD-10-CM

## 2024-09-12 DIAGNOSIS — F419 Anxiety disorder, unspecified: Secondary | ICD-10-CM | POA: Diagnosis not present

## 2024-09-12 MED ORDER — ESCITALOPRAM OXALATE 10 MG PO TABS: 10.0000 mg | ORAL_TABLET | Freq: Every day | ORAL | 0 refills | Status: DC

## 2024-09-12 NOTE — Patient Instructions (Signed)
 Stop taking Zoloft  for anxiety/depression.  Start taking Lexapro 10 mg for anxiety/depression.   You may take the propranolol  up to twice daily during the day if needed. Continue the 20 mg at night.  Schedule a follow up visit for 1 month.  It was a pleasure to see you today!

## 2024-09-12 NOTE — Assessment & Plan Note (Signed)
 Uncontrolled.  Discussed options for treatment including increasing Zoloft  to 100 mg vs another agent. She elects to try another agent so Lexapro 10 mg was sent to pharmacy.   We also discussed that she could use her propranolol  20 mg as needed for acute anxiety. She will try this.  Follow up in 1 month.

## 2024-09-12 NOTE — Progress Notes (Signed)
 Subjective:    Patient ID: Andrea Wilson, female    DOB: 26-Mar-2001, 23 y.o.   MRN: [id number]  Andrea Wilson is a very pleasant 23 y.o. female with a history of migraines, anxiety depression who presents today to discuss anxiety and depression.  Her boyfriend joins us  today.  Chronic history.  Currently managed on sertraline  50 mg daily which was initiated in June 2024.  Prior to sertraline  she was managed on venlafaxine  ER up to 150 mg daily which was effective for depression.  At the time she was planning on becoming pregnant so we transitioned her to Zoloft .  Previously she has tried Prozac  and does not recall bad side effects or reason for discontinuation.  Over the last several weeks, members have noticed behaviors such as unusual sleeping, tearfulness. Yesterday she had a panic attack with symptoms of shortness of breath and palpitations. She has noticed tearfulness, little motivation to do anything, not wanting to eat.  She's been under tremendous stress over the last few months with her ex-boyfriend which nearly resulted in lawyers.   Historically, Zoloft  has helped some, but now she doesn't feeling like she's on any medication.. Her hydroxyzine  has not helped with acute anxiety. She is planning on going to couples therapy with her boyfriend for who she reconnected with today.   Flowsheet Row Office Visit from 09/12/2024 in [facility] [place]  PHQ-9 Total Score 24      09/12/2024    2:04 PM 05/07/2024   10:56 AM 05/26/2023   11:50 AM 06/14/2022   12:15 PM  GAD 7 : Generalized Anxiety Score  Nervous, Anxious, on Edge 2 0 1 0  Control/stop worrying 3 0 2 0  Worry too much - different things 3 0 2 0  Trouble relaxing 3 0 1 1  Restless 3 0 0 0  Easily annoyed or irritable 3 1 2 1   Afraid - awful might happen 3 0 1 0  Total GAD 7 Score 20 1 9 2   Anxiety Difficulty Extremely difficult Not difficult at all Somewhat difficult Somewhat difficult       Review of Systems  Constitutional:  Positive for fatigue.  Cardiovascular:  Positive for palpitations.  Psychiatric/Behavioral:  The patient is nervous/anxious.          Past Medical History:  Diagnosis Date   Allergy     Anemia    during pregnancy   Anxiety    Axillary pain, left 06/16/2020   Chest pain 08/23/2018   COVID 12/2020   and October 2021  both mild cases   Depression    Fatigue 08/03/2021   Frequent headaches    Hypotension    Pelvic cramping 08/17/2021   Poison oak dermatitis 06/07/2023   Postpartum hemorrhage 03/26/2022   Sensation of foreign body in throat 08/17/2021   SVD (spontaneous vaginal delivery) 03/26/2022    Social History   Socioeconomic History   Marital status: Single    Spouse name: Not on file   Number of children: Not on file   Years of education: Not on file   Highest education level: Not on file  Occupational History   Not on file  Tobacco Use   Smoking status: Never   Smokeless tobacco: Never  Vaping Use   Vaping status: Never Used  Substance and Sexual Activity   Alcohol use: No   Drug use: No   Sexual activity: Not Currently  Other Topics Concern   Not on file  Social History Narrative   Lives with mother and brother. Her sister passed away in September 19, 2013 at age 67 from cancer.   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    Social Drivers of Corporate investment banker Strain: Not on file  Food Insecurity: Low Risk  (01/25/2024)   Received from [facility]   Hunger Vital Sign    Within the past 12 months, you worried that your food would run out before you got money to buy more: Never true    Within the past 12 months, the food you bought just didn't last and you didn't have money to get more. : Never true  Transportation Needs: No Transportation Needs (01/25/2024)   Received from [facility]    In the past 12 months, has lack of reliable transportation kept you from medical appointments, meetings, work  or from getting things needed for daily living? : No  Physical Activity: Not on file  Stress: Not on file  Social Connections: Unknown (07/06/2023)   Received from [facility]   Social Network    Social Network: Not on file  Intimate Partner Violence: Unknown (07/06/2023)   Received from [facility]   HITS    Physically Hurt: Not on file    Insult or Talk Down To: Not on file    Threaten Physical Harm: Not on file    Scream or Curse: Not on file    Past Surgical History:  Procedure Laterality Date   DILATION AND EVACUATION  04/13/2022   Procedure: DILATATION AND EVACUATION;  Surgeon: Estelle Service, MD;  Location: [facility] OR;  Service: Gynecology;;  LMA Anesthesia   KNEE SURGERY     TOOTH EXTRACTION      Family History  Problem Relation Age of Onset   Hyperlipidemia Mother    Anxiety disorder Mother    Depression Mother    Hypertension Father    Cancer Sister    Uterine cancer Maternal Grandmother    Lung cancer Paternal Grandfather    Migraines Other        Mfhx of migraine   Seizures Cousin    Autism Cousin     Allergies  Allergen Reactions   Prednisone  Other (See Comments)    Current Outpatient Medications on File Prior to Visit  Medication Sig Dispense Refill   acetaminophen  (TYLENOL ) 325 MG tablet Take 2 tablets (650 mg total) by mouth every 6 (six) hours as needed (for pain scale < 4).     hydrOXYzine  (ATARAX ) 25 MG tablet Take 25 mg by mouth daily as needed.     ibuprofen  (ADVIL ) 100 MG/5ML suspension Take 30 mLs (600 mg total) by mouth every 6 (six) hours as needed. 946 mL 3   propranolol  (INDERAL ) 20 MG tablet Take 1 tablet (20 mg total) by mouth at bedtime. For headache prevention 90 tablet 2   No current facility-administered medications on file prior to visit.    BP 128/82   Pulse 96   Temp (!) 97.4 F (36.3 C) (Temporal)   Ht 5' 5 (1.651 m)   Wt 137 lb (62.1 kg)   LMP 09/06/2024   SpO2 97%   BMI 22.80 kg/m  Objective:   Physical  Exam Cardiovascular:     Rate and Rhythm: Normal rate and regular rhythm.  Pulmonary:     Effort: Pulmonary effort is normal.     Breath sounds: Normal breath sounds.  Musculoskeletal:     Cervical back: Neck supple.  Skin:  General: Skin is warm and dry.  Neurological:     Mental Status: She is alert and oriented to Wilson, place, and time.  Psychiatric:        Mood and Affect: Mood normal.     Physical Exam        Assessment & Plan:  Anxiety and depression Assessment & Plan: Uncontrolled.  Discussed options for treatment including increasing Zoloft  to 100 mg vs another agent. She elects to try another agent so Lexapro 10 mg was sent to pharmacy.   We also discussed that she could use her propranolol  20 mg as needed for acute anxiety. She will try this.  Follow up in 1 month.   Orders: -     Escitalopram Oxalate; Take 1 tablet (10 mg total) by mouth daily. for anxiety and depression.  Dispense: 90 tablet; Refill: 0    Assessment and Plan Assessment & Plan         Comer MARLA Gaskins, NP     History of Present Illness

## 2024-09-19 ENCOUNTER — Ambulatory Visit: Admitting: Primary Care

## 2024-10-05 ENCOUNTER — Ambulatory Visit
Admission: EM | Admit: 2024-10-05 | Discharge: 2024-10-05 | Disposition: A | Discharge status: Home / Self Care | Attending: Family Medicine | Admitting: Family Medicine

## 2024-10-05 DIAGNOSIS — J069 Acute upper respiratory infection, unspecified: Secondary | ICD-10-CM | POA: Diagnosis not present

## 2024-10-05 LAB — POC COVID19/FLU A&B COMBO
Covid Antigen, POC: NEGATIVE
Influenza A Antigen, POC: NEGATIVE
Influenza B Antigen, POC: NEGATIVE

## 2024-10-05 MED ORDER — PROMETHAZINE-DM 6.25-15 MG/5ML PO SYRP: 5.0000 mL | ORAL_SOLUTION | Freq: Four times a day (QID) | ORAL | 0 refills | Status: DC | PRN

## 2024-10-05 MED ORDER — AZELASTINE HCL 0.1 % NA SOLN: 1.0000 | Freq: Two times a day (BID) | NASAL | 0 refills | Status: DC

## 2024-10-05 NOTE — ED Triage Notes (Signed)
 Pt reports cough, sore throat hoarseness, started off with sinus drainage, x 3 days. Has tried DayQuil, Nyquil, Tylenol  and Chloraseptic throat spray. Nausea started today.

## 2024-10-05 NOTE — ED Provider Notes (Signed)
 [facility]    CSN: [id number] Arrival date & time: 10/05/24  0931      History   Chief Complaint No chief complaint on file.   HPI Andrea Wilson is a 23 y.o. female.   Patient presenting today with 3-day history of cough, sore throat, hoarseness, congestion, sinus pressure.  Denies fever, chest pain, shortness of breath, abdominal pain, vomiting, diarrhea.  So far trying DayQuil and NyQuil with minimal relief.    Past Medical History:  Diagnosis Date   Allergy     Anemia    during pregnancy   Anxiety    Axillary pain, left 06/16/2020   Chest pain 08/23/2018   COVID 12/2020   and October 2021  both mild cases   Depression    Fatigue 08/03/2021   Frequent headaches    Hypotension    Pelvic cramping 08/17/2021   Poison oak dermatitis 06/07/2023   Postpartum hemorrhage 03/26/2022   Sensation of foreign body in throat 08/17/2021   SVD (spontaneous vaginal delivery) 03/26/2022    Patient Active Problem List   Diagnosis Date Noted   Craving for particular food 05/07/2024   Encounter for annual general medical examination with abnormal findings in adult 05/07/2024   Chronic left hip pain 05/07/2024   Pruritus 01/11/2024   Easy bruising 06/15/2023   Excessive sweating 09/15/2022   Severe preeclampsia, third trimester 03/25/2022   Chronic cystitis 09/13/2019   Abnormal thyroid  function test 05/22/2019   Rash and nonspecific skin eruption 04/04/2019   Hemorrhoid 02/11/2019   Screening examination for STD (sexually transmitted disease) 05/08/2017   Migraine without aura and without status migrainosus, not intractable 11/08/2016   Chronic headaches 10/13/2016   Anxiety and depression 04/30/2015    Past Surgical History:  Procedure Laterality Date   DILATION AND EVACUATION  04/13/2022   Procedure: DILATATION AND EVACUATION;  Surgeon: Estelle Service, MD;  Location: [facility] OR;  Service: Gynecology;;  LMA Anesthesia   KNEE SURGERY     TOOTH EXTRACTION       OB History     Gravida  1   Para  1   Term      Preterm  1   AB      Living  1      SAB      IAB      Ectopic      Multiple  0   Live Births  1            Home Medications    Prior to Admission medications   Medication Sig Start Date End Date Taking? Authorizing Provider  azelastine (ASTELIN) 0.1 % nasal spray Place 1 spray into both nostrils 2 (two) times daily. Use in each nostril as directed 10/05/24  Yes Stuart Vernell Norris, PA-C  promethazine -dextromethorphan (PROMETHAZINE -DM) 6.25-15 MG/5ML syrup Take 5 mLs by mouth 4 (four) times daily as needed. 10/05/24  Yes Stuart Vernell Norris, PA-C  acetaminophen  (TYLENOL ) 325 MG tablet Take 2 tablets (650 mg total) by mouth every 6 (six) hours as needed (for pain scale < 4). 03/28/22   Diedre Rosaline BRAVO, MD  escitalopram (LEXAPRO) 10 MG tablet Take 1 tablet (10 mg total) by mouth daily. for anxiety and depression. 09/12/24   Clark, Katherine K, NP  hydrOXYzine  (ATARAX ) 25 MG tablet Take 25 mg by mouth daily as needed.    [provider]  ibuprofen  (ADVIL ) 100 MG/5ML suspension Take 30 mLs (600 mg total) by mouth every 6 (six) hours as needed.  03/28/22   Diedre Rosaline BRAVO, MD  propranolol  (INDERAL ) 20 MG tablet Take 1 tablet (20 mg total) by mouth at bedtime. For headache prevention 06/11/24   Gretta Comer POUR, NP    Family History Family History  Problem Relation Age of Onset   Hyperlipidemia Mother    Anxiety disorder Mother    Depression Mother    Hypertension Father    Cancer Sister    Uterine cancer Maternal Grandmother    Lung cancer Paternal Grandfather    Migraines Other        Mfhx of migraine   Seizures Cousin    Autism Cousin     Social History Social History   Tobacco Use   Smoking status: Never   Smokeless tobacco: Never  Vaping Use   Vaping status: Never Used  Substance Use Topics   Alcohol use: No   Drug use: No     Allergies   Prednisone    Review of  Systems Review of Systems PER HPI  Physical Exam Triage Vital Signs ED Triage Vitals  Encounter Vitals Group     BP 10/05/24 1004 97/69     Girls Systolic BP Percentile --      Girls Diastolic BP Percentile --      Boys Systolic BP Percentile --      Boys Diastolic BP Percentile --      Pulse Rate 10/05/24 1004 80     Resp 10/05/24 1004 16     Temp 10/05/24 1004 98.9 F (37.2 C)     Temp Source 10/05/24 1004 Oral     SpO2 10/05/24 1004 96 %     Weight --      Height --      Head Circumference --      Peak Flow --      Pain Score 10/05/24 1008 0     Pain Loc --      Pain Education --      Exclude from Growth Chart --    No data found.  Updated Vital Signs BP 97/69 (BP Location: Right Arm)   Pulse 80   Temp 98.9 F (37.2 C) (Oral)   Resp 16   LMP 09/06/2024   SpO2 96%   Visual Acuity Right Eye Distance:   Left Eye Distance:   Bilateral Distance:    Right Eye Near:   Left Eye Near:    Bilateral Near:     Physical Exam Vitals and nursing note reviewed.  Constitutional:      Appearance: Normal appearance.  HENT:     Head: Atraumatic.     Right Ear: Tympanic membrane and external ear normal.     Left Ear: Tympanic membrane and external ear normal.     Nose: Rhinorrhea present.     Mouth/Throat:     Mouth: Mucous membranes are moist.     Pharynx: Posterior oropharyngeal erythema present.  Eyes:     Extraocular Movements: Extraocular movements intact.     Conjunctiva/sclera: Conjunctivae normal.  Cardiovascular:     Rate and Rhythm: Normal rate and regular rhythm.     Heart sounds: Normal heart sounds.  Pulmonary:     Effort: Pulmonary effort is normal.     Breath sounds: Normal breath sounds. No rales.  Musculoskeletal:        General: Normal range of motion.     Cervical back: Normal range of motion and neck supple.  Skin:    General: Skin is warm and dry.  Neurological:     Mental Status: She is alert and oriented to person, place, and time.   Psychiatric:        Mood and Affect: Mood normal.        Thought Content: Thought content normal.     UC Treatments / Results  Labs (all labs ordered are listed, but only abnormal results are displayed) Labs Reviewed  POC COVID19/FLU A&B COMBO    EKG   Radiology No results found.  Procedures Procedures (including critical care time)  Medications Ordered in UC Medications - No data to display  Initial Impression / Assessment and Plan / UC Course  I have reviewed the triage vital signs and the nursing notes.  Pertinent labs & imaging results that were available during my care of the patient were reviewed by me and considered in my medical decision making (see chart for details).     Vital signs and exam reassuring, rapid flu and COVID-negative.  Suspect viral respiratory infection.  Treat with Astelin, Phenergan  DM, supportive over-the-counter medications and home care.  Return for worsening or unresolving symptoms. Final Clinical Impressions(s) / UC Diagnoses   Final diagnoses:  Viral URI with cough   Discharge Instructions   None    ED Prescriptions     Medication Sig Dispense Auth. Provider   azelastine (ASTELIN) 0.1 % nasal spray Place 1 spray into both nostrils 2 (two) times daily. Use in each nostril as directed 30 mL Stuart Vernell Norris, PA-C   promethazine -dextromethorphan (PROMETHAZINE -DM) 6.25-15 MG/5ML syrup Take 5 mLs by mouth 4 (four) times daily as needed. 100 mL Stuart Vernell Norris, [place]      PDMP not reviewed this encounter.   Stuart Vernell Norris, [place] 10/05/24 1035

## 2024-10-15 ENCOUNTER — Encounter: Payer: Self-pay | Admitting: Primary Care

## 2024-10-15 ENCOUNTER — Ambulatory Visit (INDEPENDENT_AMBULATORY_CARE_PROVIDER_SITE_OTHER): Admitting: Primary Care

## 2024-10-15 VITALS — BP 114/66 | HR 86 | Temp 98.0°F | Ht 65.0 in | Wt 131.2 lb

## 2024-10-15 DIAGNOSIS — F32A Depression, unspecified: Secondary | ICD-10-CM

## 2024-10-15 DIAGNOSIS — F419 Anxiety disorder, unspecified: Secondary | ICD-10-CM | POA: Diagnosis not present

## 2024-10-15 MED ORDER — HYDROXYZINE HCL 25 MG PO TABS: 25.0000 mg | ORAL_TABLET | Freq: Three times a day (TID) | ORAL | 0 refills | Status: AC | PRN

## 2024-10-15 NOTE — Patient Instructions (Signed)
 Continue taking Lexapro and hydroxyzine  for anxiety and depression.  It was a pleasure to see you today!

## 2024-10-15 NOTE — Progress Notes (Signed)
 Subjective:    Patient ID: Andrea Wilson, female    DOB: 18-Jan-2001, 23 y.o.   MRN: [id number]  Andrea Wilson is a very pleasant 23 y.o. female with a history of migraines, anxiety and depression who presents today for follow-up of anxiety and depression.  Her boyfriend joins us  today.  She was last evaluated on 09/12/2024 for evaluation of depression symptoms with anxiety despite management on sertraline  50 mg daily.  Symptoms included tearfulness, sleeping more frequently, panic attacks, little motivation to do things.  During this visit we switched to Lexapro 10 mg daily.  She was also advised that she could take her propranolol  as needed for anxiety.  She is here for follow-up today.  Since her last visit she's feeling better. Her sleeping has improved, she is more active, and panic attacks have decreased overall. Last night she had a panic attack, she took hydroxyzine  which helped to reduce her anxiety. Overall she's pleased with her progress. She has no concerns today.   Review of Systems  Gastrointestinal:  Negative for abdominal pain and nausea.  Neurological:  Negative for headaches.  Psychiatric/Behavioral:  Negative for sleep disturbance. The patient is nervous/anxious.        See HPI         Past Medical History:  Diagnosis Date   Allergy     Anemia    during pregnancy   Anxiety    Axillary pain, left 06/16/2020   Chest pain 08/23/2018   COVID 12/2020   and October 2021  both mild cases   Depression    Fatigue 08/03/2021   Frequent headaches    Hypotension    Pelvic cramping 08/17/2021   Poison oak dermatitis 06/07/2023   Postpartum hemorrhage 03/26/2022   Sensation of foreign body in throat 08/17/2021   SVD (spontaneous vaginal delivery) 03/26/2022    Social History   Socioeconomic History   Marital status: Single    Spouse name: Not on file   Number of children: Not on file   Years of education: Not on file   Highest education level: Not on file   Occupational History   Not on file  Tobacco Use   Smoking status: Never   Smokeless tobacco: Never  Vaping Use   Vaping status: Never Used  Substance and Sexual Activity   Alcohol use: No   Drug use: No   Sexual activity: Not Currently  Other Topics Concern   Not on file  Social History Narrative   Lives with mother and brother. Her sister passed away in 11/14/2013 at age 56 from cancer.   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    Social Drivers of Corporate Investment Banker Strain: Not on file  Food Insecurity: Low Risk  (01/25/2024)   Received from [facility]   Hunger Vital Sign    Within the past 12 months, you worried that your food would run out before you got money to buy more: Never true    Within the past 12 months, the food you bought just didn't last and you didn't have money to get more. : Never true  Transportation Needs: No Transportation Needs (01/25/2024)   Received from [facility]    In the past 12 months, has lack of reliable transportation kept you from medical appointments, meetings, work or from getting things needed for daily living? : No  Physical Activity: Not on file  Stress: Not on file  Social Connections: Unknown (07/06/2023)  Received from [facility]   Social Network    Social Network: Not on file  Intimate Partner Violence: Unknown (07/06/2023)   Received from [facility]   HITS    Physically Hurt: Not on file    Insult or Talk Down To: Not on file    Threaten Physical Harm: Not on file    Scream or Curse: Not on file    Past Surgical History:  Procedure Laterality Date   DILATION AND EVACUATION  04/13/2022   Procedure: DILATATION AND EVACUATION;  Surgeon: Estelle Service, MD;  Location: [facility] OR;  Service: Gynecology;;  LMA Anesthesia   KNEE SURGERY     TOOTH EXTRACTION      Family History  Problem Relation Age of Onset   Hyperlipidemia Mother    Anxiety disorder Mother    Depression Mother    Hypertension Father     Cancer Sister    Uterine cancer Maternal Grandmother    Lung cancer Paternal Grandfather    Migraines Other        Mfhx of migraine   Seizures Cousin    Autism Cousin     Allergies  Allergen Reactions   Prednisone  Other (See Comments)    Current Outpatient Medications on File Prior to Visit  Medication Sig Dispense Refill   acetaminophen  (TYLENOL ) 325 MG tablet Take 2 tablets (650 mg total) by mouth every 6 (six) hours as needed (for pain scale < 4).     escitalopram (LEXAPRO) 10 MG tablet Take 1 tablet (10 mg total) by mouth daily. for anxiety and depression. 90 tablet 0   ibuprofen  (ADVIL ) 100 MG/5ML suspension Take 30 mLs (600 mg total) by mouth every 6 (six) hours as needed. 946 mL 3   Prenatal Vit-Fe Fumarate-FA (PRENATAL VITAMIN PO) Take by mouth daily.     promethazine -dextromethorphan (PROMETHAZINE -DM) 6.25-15 MG/5ML syrup Take 5 mLs by mouth 4 (four) times daily as needed. 100 mL 0   propranolol  (INDERAL ) 20 MG tablet Take 1 tablet (20 mg total) by mouth at bedtime. For headache prevention 90 tablet 2   No current facility-administered medications on file prior to visit.    BP 114/66   Pulse 86   Temp 98 F (36.7 C) (Oral)   Ht 5' 5 (1.651 m)   Wt 131 lb 4 oz (59.5 kg)   LMP 10/06/2024   SpO2 96%   BMI 21.84 kg/m  Objective:   Physical Exam Cardiovascular:     Rate and Rhythm: Normal rate and regular rhythm.  Pulmonary:     Effort: Pulmonary effort is normal.     Breath sounds: Normal breath sounds.  Musculoskeletal:     Cervical back: Neck supple.  Skin:    General: Skin is warm and dry.  Neurological:     Mental Status: She is alert and oriented to person, place, and time.  Psychiatric:        Mood and Affect: Mood normal.     Physical Exam        Assessment & Plan:  Anxiety and depression Assessment & Plan: Improved!  Continue Lexapro 10 mg daily.  Continue hydroxyzine  25 mg PRN.  Follow up as needed.  Orders: -     hydrOXYzine  HCl;  Take 1 tablet (25 mg total) by mouth 3 (three) times daily as needed.  Dispense: 90 tablet; Refill: 0    Assessment and Plan Assessment & Plan         Comer MARLA Gaskins, NP  Discussed the use of AI scribe software for clinical note transcription with the patient, who gave verbal consent to proceed.  History of Present Illness

## 2024-10-15 NOTE — Assessment & Plan Note (Signed)
 Improved!  Continue Lexapro 10 mg daily.  Continue hydroxyzine  25 mg PRN.  Follow up as needed.

## 2024-11-29 DIAGNOSIS — F419 Anxiety disorder, unspecified: Secondary | ICD-10-CM

## 2024-11-29 MED ORDER — ESCITALOPRAM OXALATE 10 MG PO TABS: 10.0000 mg | ORAL_TABLET | Freq: Every day | ORAL | 1 refills | Status: AC

## 2024-12-03 ENCOUNTER — Inpatient Hospital Stay (HOSPITAL_COMMUNITY)

## 2024-12-03 ENCOUNTER — Encounter (HOSPITAL_COMMUNITY): Payer: Self-pay | Admitting: *Deleted

## 2024-12-03 ENCOUNTER — Other Ambulatory Visit: Payer: Self-pay

## 2024-12-03 ENCOUNTER — Inpatient Hospital Stay (HOSPITAL_COMMUNITY)
Admission: AD | Admit: 2024-12-03 | Discharge: 2024-12-06 | DRG: 818 | Disposition: A | Discharge status: Home / Self Care | Attending: Obstetrics and Gynecology | Admitting: Obstetrics and Gynecology

## 2024-12-03 DIAGNOSIS — Z8249 Family history of ischemic heart disease and other diseases of the circulatory system: Secondary | ICD-10-CM

## 2024-12-03 DIAGNOSIS — Z820 Family history of epilepsy and other diseases of the nervous system: Secondary | ICD-10-CM

## 2024-12-03 DIAGNOSIS — Z809 Family history of malignant neoplasm, unspecified: Secondary | ICD-10-CM

## 2024-12-03 DIAGNOSIS — G43909 Migraine, unspecified, not intractable, without status migrainosus: Secondary | ICD-10-CM | POA: Diagnosis present

## 2024-12-03 DIAGNOSIS — O200 Threatened abortion: Secondary | ICD-10-CM

## 2024-12-03 DIAGNOSIS — F32A Depression, unspecified: Secondary | ICD-10-CM | POA: Diagnosis present

## 2024-12-03 DIAGNOSIS — O2300 Infections of kidney in pregnancy, unspecified trimester: Secondary | ICD-10-CM | POA: Diagnosis present

## 2024-12-03 DIAGNOSIS — Z79899 Other long term (current) drug therapy: Secondary | ICD-10-CM

## 2024-12-03 DIAGNOSIS — Z818 Family history of other mental and behavioral disorders: Secondary | ICD-10-CM

## 2024-12-03 DIAGNOSIS — Z888 Allergy status to other drugs, medicaments and biological substances status: Secondary | ICD-10-CM

## 2024-12-03 DIAGNOSIS — Z1152 Encounter for screening for COVID-19: Secondary | ICD-10-CM

## 2024-12-03 DIAGNOSIS — O2301 Infections of kidney in pregnancy, first trimester: Principal | ICD-10-CM | POA: Diagnosis present

## 2024-12-03 DIAGNOSIS — Z8049 Family history of malignant neoplasm of other genital organs: Secondary | ICD-10-CM

## 2024-12-03 DIAGNOSIS — N12 Tubulo-interstitial nephritis, not specified as acute or chronic: Secondary | ICD-10-CM | POA: Diagnosis present

## 2024-12-03 DIAGNOSIS — Z3A08 8 weeks gestation of pregnancy: Secondary | ICD-10-CM

## 2024-12-03 DIAGNOSIS — Z83438 Family history of other disorder of lipoprotein metabolism and other lipidemia: Secondary | ICD-10-CM

## 2024-12-03 DIAGNOSIS — O99351 Diseases of the nervous system complicating pregnancy, first trimester: Secondary | ICD-10-CM | POA: Diagnosis present

## 2024-12-03 DIAGNOSIS — O021 Missed abortion: Secondary | ICD-10-CM | POA: Diagnosis present

## 2024-12-03 DIAGNOSIS — Z87440 Personal history of urinary (tract) infections: Secondary | ICD-10-CM

## 2024-12-03 DIAGNOSIS — R509 Fever, unspecified: Secondary | ICD-10-CM

## 2024-12-03 DIAGNOSIS — F419 Anxiety disorder, unspecified: Secondary | ICD-10-CM

## 2024-12-03 DIAGNOSIS — Z801 Family history of malignant neoplasm of trachea, bronchus and lung: Secondary | ICD-10-CM

## 2024-12-03 DIAGNOSIS — Z8616 Personal history of COVID-19: Secondary | ICD-10-CM

## 2024-12-03 LAB — WET PREP, GENITAL
Clue Cells Wet Prep HPF POC: NONE SEEN
Sperm: NONE SEEN
Trich, Wet Prep: NONE SEEN
WBC, Wet Prep HPF POC: >=10 — AB
Yeast Wet Prep HPF POC: NONE SEEN

## 2024-12-03 LAB — URINALYSIS, ROUTINE W REFLEX MICROSCOPIC
Bilirubin Urine: NEGATIVE
Glucose, UA: NEGATIVE mg/dL
Hgb urine dipstick: NEGATIVE
Ketones, ur: NEGATIVE mg/dL
Leukocytes,Ua: NEGATIVE
Nitrite: POSITIVE — AB
Protein, ur: 30 mg/dL — AB
Specific Gravity, Urine: 1.03 (ref 1.005–1.030)
pH: 5 (ref 5.0–8.0)

## 2024-12-03 LAB — COMPREHENSIVE METABOLIC PANEL WITH GFR
ALT: 14 U/L (ref 0–44)
AST: 18 U/L (ref 15–41)
Albumin: 4.3 g/dL (ref 3.5–5.0)
Alkaline Phosphatase: 49 U/L (ref 38–126)
Anion gap: 11 (ref 5–15)
BUN: 17 mg/dL (ref 6–20)
CO2: 26 mmol/L (ref 22–32)
Calcium: 9 mg/dL (ref 8.9–10.3)
Chloride: 101 mmol/L (ref 98–111)
Creatinine, Ser: 0.71 mg/dL (ref 0.44–1.00)
GFR, Estimated: >60 mL/min
Glucose, Bld: 107 mg/dL — ABNORMAL HIGH (ref 70–99)
Potassium: 4.4 mmol/L (ref 3.5–5.1)
Sodium: 138 mmol/L (ref 135–145)
Total Bilirubin: 0.5 mg/dL (ref 0.0–1.2)
Total Protein: 7.1 g/dL (ref 6.5–8.1)

## 2024-12-03 LAB — TYPE AND SCREEN
ABO/RH(D): O POS
Antibody Screen: NEGATIVE

## 2024-12-03 LAB — CBC
HCT: 41.8 % (ref 36.0–46.0)
Hemoglobin: 14.2 g/dL (ref 12.0–15.0)
MCH: 30 pg (ref 26.0–34.0)
MCHC: 34 g/dL (ref 30.0–36.0)
MCV: 88.4 fL (ref 80.0–100.0)
Platelets: 258 K/uL (ref 150–400)
RBC: 4.73 MIL/uL (ref 3.87–5.11)
RDW: 11.5 % (ref 11.5–15.5)
WBC: 7.9 K/uL (ref 4.0–10.5)
nRBC: 0 % (ref 0.0–0.2)

## 2024-12-03 LAB — HCG, QUANTITATIVE, PREGNANCY: hCG, Beta Chain, Quant, S: 2350 m[IU]/mL — ABNORMAL HIGH

## 2024-12-03 LAB — RESP PANEL BY RT-PCR (RSV, FLU A&B, COVID)  RVPGX2
Influenza A by PCR: NEGATIVE
Influenza B by PCR: NEGATIVE
Resp Syncytial Virus by PCR: NEGATIVE
SARS Coronavirus 2 by RT PCR: NEGATIVE

## 2024-12-03 LAB — PRO BRAIN NATRIURETIC PEPTIDE: Pro Brain Natriuretic Peptide: <50 pg/mL

## 2024-12-03 LAB — LACTIC ACID, PLASMA: Lactic Acid, Venous: 1.8 mmol/L (ref 0.5–1.9)

## 2024-12-03 MED ORDER — HYDROXYZINE HCL 50 MG PO TABS: 25.0000 mg | ORAL_TABLET | Freq: Three times a day (TID) | ORAL | Status: DC | PRN

## 2024-12-03 MED ORDER — ACETAMINOPHEN 500 MG PO TABS
1000.0000 mg | ORAL_TABLET | Freq: Once | ORAL | Status: AC
  Administered 2024-12-03: 1000 mg via ORAL
  Filled 2024-12-03: qty 2

## 2024-12-03 MED ORDER — HYDROMORPHONE HCL 1 MG/ML IJ SOLN
1.0000 mg | Freq: Once | INTRAMUSCULAR | Status: AC
  Administered 2024-12-03: 1 mg via INTRAVENOUS
  Filled 2024-12-03: qty 1

## 2024-12-03 MED ORDER — LACTATED RINGERS IV BOLUS
1000.0000 mL | Freq: Once | INTRAVENOUS | Status: AC
  Administered 2024-12-03: 1000 mL via INTRAVENOUS

## 2024-12-03 MED ORDER — METHOCARBAMOL 1000 MG/10ML IJ SOLN
500.0000 mg | Freq: Once | INTRAMUSCULAR | Status: AC
  Administered 2024-12-03: 500 mg via INTRAVENOUS
  Filled 2024-12-03: qty 5

## 2024-12-03 MED ORDER — ACETAMINOPHEN 325 MG PO TABS
650.0000 mg | ORAL_TABLET | ORAL | Status: DC | PRN
  Administered 2024-12-04 – 2024-12-05 (×3): 650 mg via ORAL
  Filled 2024-12-03 (×2): qty 2

## 2024-12-03 MED ORDER — PRENATAL MULTIVITAMIN CH
1.0000 | ORAL_TABLET | Freq: Every day | ORAL | Status: DC
  Filled 2024-12-03 (×2): qty 1

## 2024-12-03 MED ORDER — ONDANSETRON HCL 4 MG/2ML IJ SOLN
4.0000 mg | Freq: Once | INTRAMUSCULAR | Status: AC
  Administered 2024-12-03: 4 mg via INTRAVENOUS
  Filled 2024-12-03: qty 2

## 2024-12-03 MED ORDER — SODIUM CHLORIDE 0.9 % IV SOLN
2.0000 g | INTRAVENOUS | Status: AC
  Administered 2024-12-03 – 2024-12-05 (×3): 2 g via INTRAVENOUS
  Filled 2024-12-03 (×3): qty 20

## 2024-12-03 MED ORDER — LACTATED RINGERS IV SOLN: INTRAVENOUS | Status: AC

## 2024-12-03 MED ORDER — PROPRANOLOL HCL 20 MG PO TABS
20.0000 mg | ORAL_TABLET | Freq: Every day | ORAL | Status: DC
  Administered 2024-12-03 – 2024-12-05 (×3): 20 mg via ORAL
  Filled 2024-12-03 (×3): qty 1

## 2024-12-03 MED ORDER — METHOCARBAMOL 500 MG PO TABS: 500.0000 mg | ORAL_TABLET | Freq: Three times a day (TID) | ORAL | Status: DC | PRN

## 2024-12-03 MED ORDER — ONDANSETRON HCL 4 MG/2ML IJ SOLN
4.0000 mg | Freq: Four times a day (QID) | INTRAMUSCULAR | Status: DC | PRN
  Administered 2024-12-04: 4 mg via INTRAVENOUS
  Filled 2024-12-03: qty 2

## 2024-12-03 MED ORDER — ESCITALOPRAM OXALATE 10 MG PO TABS
10.0000 mg | ORAL_TABLET | Freq: Every day | ORAL | Status: DC
  Administered 2024-12-03 – 2024-12-05 (×3): 10 mg via ORAL
  Filled 2024-12-03 (×4): qty 1

## 2024-12-03 MED ORDER — LACTATED RINGERS IV BOLUS
1000.0000 mL | Freq: Once | INTRAVENOUS | Status: AC
  Administered 2024-12-05: 1000 mL via INTRAVENOUS

## 2024-12-03 NOTE — MAU Note (Signed)
 Andrea Wilson is a 23 y.o. at Unknown here in MAU reporting: she's having lower back pain that's shooting down into lower legs, fever, and nausea.  Reports symptoms began last night and she hasn't taken any meds today due to vomiting.  Reports was informed she may be miscarrying, for follow up labs at office tomorrow .  States temp was 101.6 at 1300 this afternoon and has been gradually increasing.  Denies abdominal cramping, reports light VB that began approximately 3 weeks ago.  LMP: 10/06/2024 Onset of complaint: last night Pain score: 10 Vitals:   12/03/24 1533  BP: 115/60  Pulse: (!) 146  Resp: 18  Temp: 98.5 F (36.9 C)  SpO2: 97%     FHT: NA  Lab orders placed from triage: UPT & UA   RN manually took pulse, pulse 136 bpm

## 2024-12-03 NOTE — MAU Provider Note (Signed)
 " S/HPI [name] is a 23 y.o. [phone] patient who presents to MAU today with complaint of patient states she woke up with severe lower back pain that is radiating down her legs into her abdomen.  She is also complaining of fever, nausea, vomiting.  Reports her symptoms began last night and she has not been able to take any medication for resolve due to vomiting.  Patient states she was also informed that she may be miscarrying and she was scheduled to have repeat labs in the office tomorrow.  Patient also states her temperature was 101.6 at 1 PM this afternoon and has been gradually increasing.  She reports she has had vaginal bleeding that has been going on for 3 weeks.  She reports her pain at 10 out of 10 and is requesting pain management.    O BP 106/64   Pulse 84   Temp 99.6 F (37.6 C) (Oral)   Resp 18   Ht 5' 5 (1.651 m)   Wt 60.2 kg   LMP 10/06/2024   SpO2 96%   BMI 22.10 kg/m   Vitals:   12/03/24 1533 12/03/24 1716 12/03/24 1730 12/03/24 1745  BP: 115/60 104/69 95/61 94/63    12/03/24 1800 12/03/24 1856 12/03/24 1900  BP: 95/63 (!) 114/58 106/64    Physical Exam Vitals and nursing note reviewed. Exam conducted with a chaperone present.  Constitutional:      General: She is in acute distress.     Appearance: She is underweight. She is ill-appearing.  HENT:     Head: Normocephalic.  Cardiovascular:     Rate and Rhythm: Tachycardia present.  Pulmonary:     Effort: Pulmonary effort is normal.  Abdominal:     General: There is no distension.     Palpations: Abdomen is soft.     Tenderness: There is no abdominal tenderness. There is no guarding.  Genitourinary:    General: Normal vulva.     Exam position: Lithotomy position.     Comments: Pelvic exam chaperoned by Griselda Punter RN No lesions visualized externally  Speculum: No pooling, scant discharge visualized, no blood in the vaginal vault, and cervix is visually closed  Musculoskeletal:         General: Normal range of motion.     Cervical back: Normal range of motion.  Skin:    General: Skin is warm.  Neurological:     Mental Status: She is alert and oriented to person, place, and time.  Psychiatric:        Mood and Affect: Mood normal.        Behavior: Behavior normal.     MDM  HIGH- > ADMIT to OBS  Available prenatal records reviewed Physical exam performed with pelvic Febrile to touch ( Rectal 100.4)  CBC: NM CMP: pending BNP: NM Lactic acid :pending U/A: Positive nitrates, with 30 proteinuria , neg leucocytes ( Will send for CX) hCG serum for possible miscarriage ( 2,350) EKG for maternal tachycardia ( Sinus Tachycardia) IV fluids for fever and maternal tachycardia  IV Dilaudid  for pain management Zofran  IV push for nausea and vomiting Respiratory panel ordered for fever, nausea, vomiting, body aches OB Ultrasound to r/o MAB vs septic abortion Tylenol  for fever   Patient status discussed with Dr Izell [facility] MAU Attending) would recommend admission for presumed Pyelonephritis vs septic abortion. Dr Claudene [facility] Private Attending) aware of patient status and in agreement for admission and observation with treatment for presumed pyelonephritis and  will admit to OBS   Orders Placed This Encounter  Procedures   Resp panel by RT-PCR (RSV, Flu A&B, Covid) Anterior Nasal Swab    Standing Status:   Standing    Number of Occurrences:   1   Wet prep, genital    Standing Status:   Standing    Number of Occurrences:   1   Culture, OB Urine    Standing Status:   Standing    Number of Occurrences:   1   US  OB LESS THAN 14 WEEKS WITH OB TRANSVAGINAL    Standing Status:   Standing    Number of Occurrences:   1    Symptom/Reason for Exam:   Vaginal bleeding [[id number]]    Symptom/Reason for Exam:   Pain [[id number]]   Urinalysis, Routine w reflex microscopic -Urine, Clean Catch    Standing Status:   Standing    Number of Occurrences:   1    Specimen Source:   Urine, Clean  Catch [76]   CBC    Standing Status:   Standing    Number of Occurrences:   1   hCG, quantitative, pregnancy    Standing Status:   Standing    Number of Occurrences:   1   Pro Brain natriuretic peptide    Standing Status:   Standing    Number of Occurrences:   1   Comprehensive metabolic panel    Standing Status:   Standing    Number of Occurrences:   1   Lactic acid, plasma    Standing Status:   Standing    Number of Occurrences:   1   Airborne and Contact precautions    Standing Status:   Standing    Number of Occurrences:   1   EKG 12-Lead    Standing Status:   Standing    Number of Occurrences:   1   EKG 12-Lead    Standing Status:   Standing    Number of Occurrences:   1      Results for orders placed or performed during the hospital encounter of 12/03/24 (from the past 24 hours)  Urinalysis, Routine w reflex microscopic -Urine, Clean Catch     Status: Abnormal   Collection Time: 12/03/24  2:44 PM  Result Value Ref Range   Color, Urine AMBER (A) YELLOW   APPearance CLEAR CLEAR   Specific Gravity, Urine 1.030 1.005 - 1.030   pH 5.0 5.0 - 8.0   Glucose, UA NEGATIVE NEGATIVE mg/dL   Hgb urine dipstick NEGATIVE NEGATIVE   Bilirubin Urine NEGATIVE NEGATIVE   Ketones, ur NEGATIVE NEGATIVE mg/dL   Protein, ur 30 (A) NEGATIVE mg/dL   Nitrite POSITIVE (A) NEGATIVE   Leukocytes,Ua NEGATIVE NEGATIVE   RBC / HPF 0-5 0 - 5 RBC/hpf   WBC, UA 0-5 0 - 5 WBC/hpf   Bacteria, UA RARE (A) NONE SEEN   Squamous Epithelial / HPF 0-5 0 - 5 /HPF   Mucus PRESENT    Non Squamous Epithelial 0-5 (A) NONE SEEN  CBC     Status: None   Collection Time: 12/03/24  4:49 PM  Result Value Ref Range   WBC 7.9 4.0 - 10.5 K/uL   RBC 4.73 3.87 - 5.11 MIL/uL   Hemoglobin 14.2 12.0 - 15.0 g/dL   HCT 58.1 63.9 - 53.9 %   MCV 88.4 80.0 - 100.0 fL   MCH 30.0 26.0 - 34.0 pg   MCHC 34.0 30.0 - 36.0  g/dL   RDW 88.4 88.4 - 84.4 %   Platelets 258 150 - 400 K/uL   nRBC 0.0 0.0 - 0.2 %  hCG,  quantitative, pregnancy     Status: Abnormal   Collection Time: 12/03/24  4:49 PM  Result Value Ref Range   hCG, Beta Chain, Quant, S 2,350 (H) <5 mIU/mL  Pro Brain natriuretic peptide     Status: None   Collection Time: 12/03/24  4:49 PM  Result Value Ref Range   Pro Brain Natriuretic Peptide <50.0 <300.0 pg/mL  Wet prep, genital     Status: Abnormal   Collection Time: 12/03/24  5:11 PM  Result Value Ref Range   Yeast Wet Prep HPF POC NONE SEEN NONE SEEN   Trich, Wet Prep NONE SEEN NONE SEEN   Clue Cells Wet Prep HPF POC NONE SEEN NONE SEEN   WBC, Wet Prep HPF POC >=10 (A) <10   Sperm NONE SEEN        ASSESSMENT Medical screening exam complete  Pyelonephritis affecting pregnancy in first trimester  Fever of unknown origin  [redacted] weeks gestation of pregnancy  Miscarriage, threatened, early pregnancy    PLAN Admit to OBS for presumed pyelonephritis vs septic abortion  Admission orders to follow  Dr Claudene [facility] Private Attending) aware and to discuss management plan with patient at the bedside  Littie Olam LABOR, NP 12/03/2024 7:02 PM   "

## 2024-12-03 NOTE — H&P (Signed)
 Andrea Wilson is a 23 y.o. female G3P1011 @ 8.2 by approximate LMP presenting for fever, severe back pain, myalgias, and vomiting.   Fever began this morning, 100.6 but then increased to 101.6 by the afternoon. She has accompanying bilateral low back pain that radiates down her legs to the level of the calf as well as wrapping around toward the groin. +Urinary urgency began yesterday. Denies hematuria or dysuria.   Patient reports daughter had URI symptoms beginning over the weekend but was negative for flu/COVID/RSV at pediatrician. Patient does not have any symptoms herself. She is flu vaccinated  Of note, patient has a likely non-viable pregnancy. She has been having bleeding on and off since +UPT.  She has had three ultrasounds since 12/16, most recently 12/29 in office which show intrauterine GS without YS or FP. HCG 2389 12/29. This is a desired pregnancy. Denies obviously malodorous discharge, but reports brown which is similar to the blood she has been having.  OB History     Gravida  2   Para  1   Term      Preterm  1   AB      Living  1      SAB      IAB      Ectopic      Multiple  0   Live Births  1          Past Medical History:  Diagnosis Date   Allergy     Anemia    during pregnancy   Anxiety    Axillary pain, left 06/16/2020   Chest pain 08/23/2018   COVID 12/2020   and October 2021  both mild cases   Depression    Fatigue 08/03/2021   Frequent headaches    Hypotension    Pelvic cramping 08/17/2021   Poison oak dermatitis 06/07/2023   Postpartum hemorrhage 03/26/2022   Sensation of foreign body in throat 08/17/2021   SVD (spontaneous vaginal delivery) 03/26/2022   Past Surgical History:  Procedure Laterality Date   DILATION AND EVACUATION  04/13/2022   Procedure: DILATATION AND EVACUATION;  Surgeon: Estelle Service, MD;  Location: [facility] OR;  Service: Gynecology;;  LMA Anesthesia   HIP ARTHROSCOPY Left 07/2024   KNEE SURGERY     TOOTH  EXTRACTION     Family History: family history includes Anxiety disorder in her mother; Autism in her cousin; Cancer in her sister; Depression in her mother; Hyperlipidemia in her mother; Hypertension in her father; Lung cancer in her paternal grandfather; Migraines in an other family member; Seizures in her cousin; Uterine cancer in her maternal grandmother. Social History:  reports that she has never smoked. She has never used smokeless tobacco. She reports that she does not drink alcohol and does not use drugs.      Review of Systems  Constitutional:  Positive for chills and fever.  HENT:  Negative for congestion.   Respiratory:  Negative for shortness of breath.   Cardiovascular:  Negative for chest pain.  Gastrointestinal:  Positive for nausea. Negative for abdominal pain.  Genitourinary:  Positive for frequency, urgency and vaginal bleeding. Negative for pelvic pain.  Musculoskeletal:  Positive for back pain and myalgias.   Maternal Medical History:  Reason for admission: Nausea.       Blood pressure 106/64, pulse 84, temperature 99.6 F (37.6 C), temperature source Oral, resp. rate 18, height 5' 5 (1.651 m), weight 60.2 kg, last menstrual period 10/06/2024, SpO2 96%. Exam Physical Exam Constitutional:  General: She is in acute distress.     Appearance: Normal appearance.  HENT:     Head: Normocephalic and atraumatic.     Nose: No congestion.  Cardiovascular:     Rate and Rhythm: Tachycardia present.  Pulmonary:     Effort: Pulmonary effort is normal.  Abdominal:     General: There is no distension.     Palpations: Abdomen is soft.     Tenderness: There is no abdominal tenderness. There is right CVA tenderness (mild).     Comments: +bilateral low back tenderness  Skin:    General: Skin is warm and dry.  Neurological:     Mental Status: She is alert.  Psychiatric:        Mood and Affect: Mood normal.        Behavior: Behavior normal.    Results for orders  placed or performed during the hospital encounter of 12/03/24 (from the past 24 hours)  Urinalysis, Routine w reflex microscopic -Urine, Clean Catch     Status: Abnormal   Collection Time: 12/03/24  2:44 PM  Result Value Ref Range   Color, Urine AMBER (A) YELLOW   APPearance CLEAR CLEAR   Specific Gravity, Urine 1.030 1.005 - 1.030   pH 5.0 5.0 - 8.0   Glucose, UA NEGATIVE NEGATIVE mg/dL   Hgb urine dipstick NEGATIVE NEGATIVE   Bilirubin Urine NEGATIVE NEGATIVE   Ketones, ur NEGATIVE NEGATIVE mg/dL   Protein, ur 30 (A) NEGATIVE mg/dL   Nitrite POSITIVE (A) NEGATIVE   Leukocytes,Ua NEGATIVE NEGATIVE   RBC / HPF 0-5 0 - 5 RBC/hpf   WBC, UA 0-5 0 - 5 WBC/hpf   Bacteria, UA RARE (A) NONE SEEN   Squamous Epithelial / HPF 0-5 0 - 5 /HPF   Mucus PRESENT    Non Squamous Epithelial 0-5 (A) NONE SEEN  Resp panel by RT-PCR (RSV, Flu A&B, Covid) Anterior Nasal Swab     Status: None   Collection Time: 12/03/24  3:43 PM   Specimen: Anterior Nasal Swab  Result Value Ref Range   SARS Coronavirus 2 by RT PCR NEGATIVE NEGATIVE   Influenza A by PCR NEGATIVE NEGATIVE   Influenza B by PCR NEGATIVE NEGATIVE   Resp Syncytial Virus by PCR NEGATIVE NEGATIVE  CBC     Status: None   Collection Time: 12/03/24  4:49 PM  Result Value Ref Range   WBC 7.9 4.0 - 10.5 K/uL   RBC 4.73 3.87 - 5.11 MIL/uL   Hemoglobin 14.2 12.0 - 15.0 g/dL   HCT 58.1 63.9 - 53.9 %   MCV 88.4 80.0 - 100.0 fL   MCH 30.0 26.0 - 34.0 pg   MCHC 34.0 30.0 - 36.0 g/dL   RDW 88.4 88.4 - 84.4 %   Platelets 258 150 - 400 K/uL   nRBC 0.0 0.0 - 0.2 %  hCG, quantitative, pregnancy     Status: Abnormal   Collection Time: 12/03/24  4:49 PM  Result Value Ref Range   hCG, Beta Chain, Quant, S 2,350 (H) <5 mIU/mL  Pro Brain natriuretic peptide     Status: None   Collection Time: 12/03/24  4:49 PM  Result Value Ref Range   Pro Brain Natriuretic Peptide <50.0 <300.0 pg/mL  Wet prep, genital     Status: Abnormal   Collection Time: 12/03/24   5:11 PM  Result Value Ref Range   Yeast Wet Prep HPF POC NONE SEEN NONE SEEN   Trich, Wet Prep NONE SEEN NONE  SEEN   Clue Cells Wet Prep HPF POC NONE SEEN NONE SEEN   WBC, Wet Prep HPF POC >=10 (A) <10   Sperm NONE SEEN   Comprehensive metabolic panel     Status: Abnormal   Collection Time: 12/03/24  6:52 PM  Result Value Ref Range   Sodium 138 135 - 145 mmol/L   Potassium 4.4 3.5 - 5.1 mmol/L   Chloride 101 98 - 111 mmol/L   CO2 26 22 - 32 mmol/L   Glucose, Bld 107 (H) 70 - 99 mg/dL   BUN 17 6 - 20 mg/dL   Creatinine, Ser 9.28 0.44 - 1.00 mg/dL   Calcium 9.0 8.9 - 89.6 mg/dL   Total Protein 7.1 6.5 - 8.1 g/dL   Albumin 4.3 3.5 - 5.0 g/dL   AST 18 15 - 41 U/L   ALT 14 0 - 44 U/L   Alkaline Phosphatase 49 38 - 126 U/L   Total Bilirubin 0.5 0.0 - 1.2 mg/dL   GFR, Estimated >39 >39 mL/min   Anion gap 11 5 - 15  Lactic acid, plasma     Status: None   Collection Time: 12/03/24  6:52 PM  Result Value Ref Range   Lactic Acid, Venous 1.8 0.5 - 1.9 mmol/L  Type and screen [facility]     Status: None (Preliminary result)   Collection Time: 12/03/24  8:13 PM  Result Value Ref Range   ABO/RH(D) PENDING    Antibody Screen PENDING    Sample Expiration      12/06/2024,2359 Performed at [facility], [address], [place], [place] [address]      Assessment/Plan: 23 yo G3P1011 @ 8.2 by LMP, admission for observation in setting of fever in pregnancy 1) Fever of unknown origin:  Possible sources include pyelonephritis vs. Septic abortion vs. Viral etiology vs. other, with pyelonephritis as leading working diagnosis.     - On arrival patient in acute pain requiring dilaudid  to undergo exam     - Pyelonephritis: preceding urinary frequency and urgency, hx frequent UTI. Clean catch urine nitrate positive, urine sent for culture. Rocephin  ordered     - Septic abortion: there is no abdominal tenderness or malodorous discharge, which points away from this diagnosis     - Viral etiology: daughter with recent viral illness but viral testing here negative and patient without obvious URI sx  2) Threatened AB Likely anembryonic non-viable gestation. US  suspicious for pregnancy failure. HCG 2389 in office 12/29 and 2350 here. As she currently has febrile illness and this is a highly desired pregnancy, we will repeat HCG again tomorrow. If patient develops symptoms more concerning for septic AB, she will undergo treatment as inpatient. Otherwise plan to address as outpatient if HCG is downtrending tomorrow. Would avoid cytotec  given fever   Larraine DELENA Sharps 12/03/2024, 7:50 PM

## 2024-12-04 LAB — CBC WITH DIFFERENTIAL/PLATELET
Abs Immature Granulocytes: 0.01 K/uL (ref 0.00–0.07)
Basophils Absolute: 0 K/uL (ref 0.0–0.1)
Basophils Relative: 0 %
Eosinophils Absolute: 0.1 K/uL (ref 0.0–0.5)
Eosinophils Relative: 3 %
HCT: 35.7 % — ABNORMAL LOW (ref 36.0–46.0)
Hemoglobin: 12.2 g/dL (ref 12.0–15.0)
Immature Granulocytes: 0 %
Lymphocytes Relative: 38 %
Lymphs Abs: 1.4 K/uL (ref 0.7–4.0)
MCH: 30.7 pg (ref 26.0–34.0)
MCHC: 34.2 g/dL (ref 30.0–36.0)
MCV: 89.7 fL (ref 80.0–100.0)
Monocytes Absolute: 0.6 K/uL (ref 0.1–1.0)
Monocytes Relative: 17 %
Neutro Abs: 1.6 K/uL — ABNORMAL LOW (ref 1.7–7.7)
Neutrophils Relative %: 42 %
Platelets: 209 K/uL (ref 150–400)
RBC: 3.98 MIL/uL (ref 3.87–5.11)
RDW: 11.5 % (ref 11.5–15.5)
WBC: 3.8 K/uL — ABNORMAL LOW (ref 4.0–10.5)
nRBC: 0 % (ref 0.0–0.2)

## 2024-12-04 LAB — GC/CHLAMYDIA PROBE AMP (~~LOC~~) NOT AT ARMC
Chlamydia: NEGATIVE
Comment: NEGATIVE
Comment: NORMAL
Neisseria Gonorrhea: NEGATIVE

## 2024-12-04 LAB — HCG, QUANTITATIVE, PREGNANCY: hCG, Beta Chain, Quant, S: 1967 m[IU]/mL — ABNORMAL HIGH

## 2024-12-04 MED ORDER — CYCLOBENZAPRINE HCL 10 MG PO TABS
10.0000 mg | ORAL_TABLET | Freq: Once | ORAL | Status: AC
  Administered 2024-12-04: 10 mg via ORAL
  Filled 2024-12-04: qty 1

## 2024-12-04 NOTE — Progress Notes (Signed)
 Subjective: Feeling somewhat better. No longer having the severe pelvic pain she was yesterday. Has been having some uterine cramping and vaginal spotting today. Back pain is improved but has migrated more to the left CVA. Ambulating and voiding well. Tolerating liquids, but gets somewhat nauseated with PO.   Objective:    12/04/2024   12:24 PM 12/04/2024    7:27 AM 12/04/2024    6:24 AM  Vitals with BMI  Systolic 97 102 92  Diastolic 57 70 54  Pulse 64 63 62   Physical Exam:  General: no acute distress Pulm: normal work of breathing on room air Card: well perfused Abd: soft, non-tender GU: no suprapubic TTP, mild left CVA TTP MSK: normal ROM Neuro: no focal deficits, oriented x3 Psych: normal mood, normal thought  Assessment/Plan: Andrea Wilson is a 23 y.o. female G3P1011 @ 8.3 by approximate LMP admitted for observation for fever of unknown origin. 1) Fever of unknown origin (last elevated temp 100.4 on 12/30 @ 17:21):  Possible sources include pyelonephritis vs. Septic abortion vs. Viral etiology vs. other, with pyelonephritis as leading working diagnosis.     - On arrival patient was in acute pelvic/back pain requiring dilaudid  to undergo exam     - Suspected pyelonephritis: preceding urinary frequency and urgency, hx frequent UTI. Clean catch urine nitrate positive, urine sent for culture, pending. S/p one dose of Rocephin , ordered q24hrs. Given improvement with this regimen, will continue until 48 hours afebrile.      - Septic abortion: there is no abdominal tenderness or malodorous discharge, which points away from this diagnosis    - Viral etiology: daughter with recent viral illness but viral testing here negative and patient without obvious URI sx   2) Findings suspicious for miscarriage: Likely anembryonic non-viable gestation. She has had three ultrasounds since 12/16 (showed IUP with GS and possible YS); most recently 12/29 (showed intrauterine GS without YS or FP).   HCG 2389 in office 12/29 and 2350 here; 24 hour repeat pending. Increased vaginal spotting and uterine cramping today. If patient develops symptoms more concerning for septic AB, she will undergo treatment as inpatient. Otherwise plan to address as outpatient if HCG is downtrending. Would avoid cytotec  given fever work-up.  Dispo: Given pyelonephritis during pregnancy is leading differential diagnosis, will continue IV antibiotics until 48 hours afebrile, pending Ucx.     LOS: 0 days    Rubie DELENA Husky, MD 12/04/2024, 3:45 PM

## 2024-12-04 NOTE — Progress Notes (Signed)
 Brief Update Note  RN called reporting patient complaining of migraine headache. Patient reports Tylenol  was helping but is no longer working. Already had dose of propranolol  at 18:39. I asked what medications patient has used to help with migraines in the past, patient uncertain of prior medication. Will try flexeril .  Addendum: RN called again reporting patient's headache unresolved by flexeril , still 8/10. Will order benadryl  and reglan .

## 2024-12-05 ENCOUNTER — Encounter (HOSPITAL_COMMUNITY): Payer: Self-pay | Admitting: Student

## 2024-12-05 DIAGNOSIS — Z83438 Family history of other disorder of lipoprotein metabolism and other lipidemia: Secondary | ICD-10-CM | POA: Diagnosis not present

## 2024-12-05 DIAGNOSIS — O99351 Diseases of the nervous system complicating pregnancy, first trimester: Secondary | ICD-10-CM | POA: Diagnosis present

## 2024-12-05 DIAGNOSIS — Z1152 Encounter for screening for COVID-19: Secondary | ICD-10-CM | POA: Diagnosis not present

## 2024-12-05 DIAGNOSIS — Z3A08 8 weeks gestation of pregnancy: Secondary | ICD-10-CM | POA: Diagnosis not present

## 2024-12-05 DIAGNOSIS — Z8049 Family history of malignant neoplasm of other genital organs: Secondary | ICD-10-CM | POA: Diagnosis not present

## 2024-12-05 DIAGNOSIS — F32A Depression, unspecified: Secondary | ICD-10-CM | POA: Diagnosis present

## 2024-12-05 DIAGNOSIS — O021 Missed abortion: Secondary | ICD-10-CM | POA: Diagnosis present

## 2024-12-05 DIAGNOSIS — N12 Tubulo-interstitial nephritis, not specified as acute or chronic: Secondary | ICD-10-CM | POA: Diagnosis present

## 2024-12-05 DIAGNOSIS — Z818 Family history of other mental and behavioral disorders: Secondary | ICD-10-CM | POA: Diagnosis not present

## 2024-12-05 DIAGNOSIS — Z87440 Personal history of urinary (tract) infections: Secondary | ICD-10-CM | POA: Diagnosis not present

## 2024-12-05 DIAGNOSIS — Z8249 Family history of ischemic heart disease and other diseases of the circulatory system: Secondary | ICD-10-CM | POA: Diagnosis not present

## 2024-12-05 DIAGNOSIS — Z8616 Personal history of COVID-19: Secondary | ICD-10-CM | POA: Diagnosis not present

## 2024-12-05 DIAGNOSIS — Z820 Family history of epilepsy and other diseases of the nervous system: Secondary | ICD-10-CM | POA: Diagnosis not present

## 2024-12-05 DIAGNOSIS — Z809 Family history of malignant neoplasm, unspecified: Secondary | ICD-10-CM | POA: Diagnosis not present

## 2024-12-05 DIAGNOSIS — G43909 Migraine, unspecified, not intractable, without status migrainosus: Secondary | ICD-10-CM | POA: Diagnosis present

## 2024-12-05 DIAGNOSIS — Z888 Allergy status to other drugs, medicaments and biological substances status: Secondary | ICD-10-CM | POA: Diagnosis not present

## 2024-12-05 DIAGNOSIS — Z79899 Other long term (current) drug therapy: Secondary | ICD-10-CM | POA: Diagnosis not present

## 2024-12-05 DIAGNOSIS — O2301 Infections of kidney in pregnancy, first trimester: Secondary | ICD-10-CM | POA: Diagnosis present

## 2024-12-05 DIAGNOSIS — Z801 Family history of malignant neoplasm of trachea, bronchus and lung: Secondary | ICD-10-CM | POA: Diagnosis not present

## 2024-12-05 DIAGNOSIS — Z3A00 Weeks of gestation of pregnancy not specified: Secondary | ICD-10-CM | POA: Diagnosis not present

## 2024-12-05 LAB — CULTURE, OB URINE: Culture: NO GROWTH

## 2024-12-05 MED ORDER — DIPHENHYDRAMINE HCL 50 MG/ML IJ SOLN
25.0000 mg | Freq: Once | INTRAMUSCULAR | Status: AC
  Administered 2024-12-05: 25 mg via INTRAVENOUS
  Filled 2024-12-05: qty 1

## 2024-12-05 MED ORDER — METOCLOPRAMIDE HCL 5 MG/ML IJ SOLN
5.0000 mg | Freq: Once | INTRAMUSCULAR | Status: AC
  Administered 2024-12-05: 5 mg via INTRAVENOUS
  Filled 2024-12-05: qty 2

## 2024-12-05 NOTE — Plan of Care (Signed)

## 2024-12-05 NOTE — Progress Notes (Signed)
 Patient asking can she still ear, Dr. Okey called to clarify POC. Patient informed that she can have regular diet, will likely be scheduled for D&E tomorrow.

## 2024-12-05 NOTE — Progress Notes (Signed)
 Patient ID: Andrea Wilson, female   DOB: 2001-07-11, 24 y.o.   MRN: [id number]   S) patient feeling much better.  Pain from admission has essentially resolved.  She is continuing to have some light vaginal bleeding. O)  Vitals:   12/04/24 2308 12/05/24 1018 12/05/24 1437 12/05/24 1804  BP:  (!) 95/50 106/64 105/63  Pulse: 70 63 63 70  Resp: 16 16 17 17   Temp: 98.1 F (36.7 C) 98 F (36.7 C) 98.2 F (36.8 C) 98.3 F (36.8 C)  TempSrc: Oral Oral Oral Oral  SpO2: 99% 98% 99% 97%  Weight:      Height:       AO x 3, NAD Normal work of breathing Abdomen soft  Results for orders placed or performed during the hospital encounter of 12/03/24 (from the past 72 hours)  Urinalysis, Routine w reflex microscopic -Urine, Clean Catch     Status: Abnormal   Collection Time: 12/03/24  2:44 PM  Result Value Ref Range   Color, Urine AMBER (A) YELLOW    Comment: BIOCHEMICALS MAY BE AFFECTED BY COLOR   APPearance CLEAR CLEAR   Specific Gravity, Urine 1.030 1.005 - 1.030   pH 5.0 5.0 - 8.0   Glucose, UA NEGATIVE NEGATIVE mg/dL   Hgb urine dipstick NEGATIVE NEGATIVE   Bilirubin Urine NEGATIVE NEGATIVE   Ketones, ur NEGATIVE NEGATIVE mg/dL   Protein, ur 30 (A) NEGATIVE mg/dL   Nitrite POSITIVE (A) NEGATIVE   Leukocytes,Ua NEGATIVE NEGATIVE   RBC / HPF 0-5 0 - 5 RBC/hpf   WBC, UA 0-5 0 - 5 WBC/hpf   Bacteria, UA RARE (A) NONE SEEN   Squamous Epithelial / HPF 0-5 0 - 5 /HPF   Mucus PRESENT    Non Squamous Epithelial 0-5 (A) NONE SEEN    Comment: Performed at [facility], [address], [place], [place] [address]  Culture, MAINE Urine     Status: None   Collection Time: 12/03/24  2:44 PM   Specimen: Urine, Random  Result Value Ref Range   Specimen Description URINE, RANDOM    Special Requests NONE    Culture      NO GROWTH Performed at [facility], [address] [place], [place] [address]    Report Status 12/05/2024 FINAL   Resp panel by RT-PCR (RSV, Flu A&B, Covid)  Anterior Nasal Swab     Status: None   Collection Time: 12/03/24  3:43 PM   Specimen: Anterior Nasal Swab  Result Value Ref Range   SARS Coronavirus 2 by RT PCR NEGATIVE NEGATIVE   Influenza A by PCR NEGATIVE NEGATIVE   Influenza B by PCR NEGATIVE NEGATIVE    Comment: (NOTE) The Xpert Xpress SARS-CoV-2/FLU/RSV plus assay is intended as an aid in the diagnosis of influenza from Nasopharyngeal swab specimens and should not be used as a sole basis for treatment. Nasal washings and aspirates are unacceptable for Xpert Xpress SARS-CoV-2/FLU/RSV testing.  Fact Sheet for Patients: bloggercourse.com  Fact Sheet for Healthcare Providers: seriousbroker.it  This test is not yet approved or cleared by the [place]  FDA and has been authorized for detection and/or diagnosis of SARS-CoV-2 by FDA under an Emergency Use Authorization (EUA). This EUA will remain in effect (meaning this test can be used) for the duration of the COVID-19 declaration under Section 564(b)(1) of the Act, 21 U.S.C. section 360bbb-3(b)(1), unless the authorization is terminated or revoked.     Resp Syncytial Virus by PCR NEGATIVE NEGATIVE  Comment: (NOTE) Fact Sheet for Patients: bloggercourse.com  Fact Sheet for Healthcare Providers: seriousbroker.it  This test is not yet approved or cleared by the [place]  FDA and has been authorized for detection and/or diagnosis of SARS-CoV-2 by FDA under an Emergency Use Authorization (EUA). This EUA will remain in effect (meaning this test can be used) for the duration of the COVID-19 declaration under Section 564(b)(1) of the Act, 21 U.S.C. section 360bbb-3(b)(1), unless the authorization is terminated or revoked.  Performed at [facility], [address] [place], [place] [address]   CBC     Status: None   Collection Time: 12/03/24  4:49 PM  Result  Value Ref Range   WBC 7.9 4.0 - 10.5 K/uL   RBC 4.73 3.87 - 5.11 MIL/uL   Hemoglobin 14.2 12.0 - 15.0 g/dL   HCT 58.1 63.9 - 53.9 %   MCV 88.4 80.0 - 100.0 fL   MCH 30.0 26.0 - 34.0 pg   MCHC 34.0 30.0 - 36.0 g/dL   RDW 88.4 88.4 - 84.4 %   Platelets 258 150 - 400 K/uL   nRBC 0.0 0.0 - 0.2 %    Comment: Performed at [facility], [address] [place], [place] [address]  hCG, quantitative, pregnancy     Status: Abnormal   Collection Time: 12/03/24  4:49 PM  Result Value Ref Range   hCG, Beta Chain, Quant, S 2,350 (H) <5 mIU/mL    Comment:          GEST. AGE      CONC.  (mIU/mL)   <=1 WEEK        5 - 50     2 WEEKS       50 - 500     3 WEEKS       100 - 10,000     4 WEEKS     1,000 - 30,000     5 WEEKS     3,500 - 115,000   6-8 WEEKS     12,000 - 270,000    12 WEEKS     15,000 - 220,000        FEMALE AND NON-PREGNANT FEMALE:     LESS THAN 5 mIU/mL Performed at [facility], [address] [place], [place] [address]   Pro Brain natriuretic peptide     Status: None   Collection Time: 12/03/24  4:49 PM  Result Value Ref Range   Pro Brain Natriuretic Peptide <50.0 <300.0 pg/mL    Comment: (NOTE) Age Group        Cut-Points    Interpretation  < 50 years     450 pg/mL       NT-proBNP > 450 pg/mL indicates                                ADHF is likely              50 to 75 years  900 pg/mL      NT-proBNP > 900 pg/mL indicates          ADHF is likely  > 75 years      1800 pg/mL     NT-proBNP > 1800 pg/mL indicates          ADHF is likely  All ages    Results between       Indeterminate. Further clinical             300 and the cut-   information is needed to determine            point for age group   if ADHF is present.                                                             Elecsys proBNP II/ Elecsys proBNP II STAT           Cut-Point                       Interpretation  300 pg/mL                    NT-proBNP <300pg/mL  indicates                             ADHF is not likely  Performed at [facility], [address], [place], [place] [address]   Wet prep, genital     Status: Abnormal   Collection Time: 12/03/24  5:11 PM  Result Value Ref Range   Yeast Wet Prep HPF POC NONE SEEN NONE SEEN   Trich, Wet Prep NONE SEEN NONE SEEN   Clue Cells Wet Prep HPF POC NONE SEEN NONE SEEN   WBC, Wet Prep HPF POC >=10 (A) <10   Sperm NONE SEEN     Comment: Performed at [facility], [address], [place], [place] [address]  GC/Chlamydia probe amp ([place])not at [facility]     Status: None   Collection Time: 12/03/24  5:12 PM  Result Value Ref Range   Neisseria Gonorrhea Negative    Chlamydia Negative    Comment Normal Reference Ranger Chlamydia - Negative    Comment      Normal Reference Range Neisseria Gonorrhea - Negative  Comprehensive metabolic panel     Status: Abnormal   Collection Time: 12/03/24  6:52 PM  Result Value Ref Range   Sodium 138 135 - 145 mmol/L   Potassium 4.4 3.5 - 5.1 mmol/L   Chloride 101 98 - 111 mmol/L   CO2 26 22 - 32 mmol/L   Glucose, Bld 107 (H) 70 - 99 mg/dL    Comment: Glucose reference range applies only to samples taken after fasting for at least 8 hours.   BUN 17 6 - 20 mg/dL   Creatinine, Ser 9.28 0.44 - 1.00 mg/dL   Calcium 9.0 8.9 - 89.6 mg/dL   Total Protein 7.1 6.5 - 8.1 g/dL   Albumin 4.3 3.5 - 5.0 g/dL   AST 18 15 - 41 U/L   ALT 14 0 - 44 U/L   Alkaline Phosphatase 49 38 - 126 U/L   Total Bilirubin 0.5 0.0 - 1.2 mg/dL   GFR, Estimated >39 >39 mL/min    Comment: (NOTE) Calculated using the CKD-EPI Creatinine Equation (2021)    Anion gap 11 5 - 15    Comment: Performed at [facility], [address] [place], [place] [address]  Lactic acid, plasma     Status: None  Collection Time: 12/03/24  6:52 PM  Result Value Ref Range   Lactic Acid, Venous 1.8 0.5 - 1.9 mmol/L    Comment: Performed at [facility], [address]  [place], [place] [address]  Type and screen [facility]     Status: None   Collection Time: 12/03/24  8:13 PM  Result Value Ref Range   ABO/RH(D) O POS    Antibody Screen NEG    Sample Expiration      12/06/2024,2359 Performed at [facility], [address], [place], [place] [address]   CBC with Differential/Platelet     Status: Abnormal   Collection Time: 12/04/24  6:04 PM  Result Value Ref Range   WBC 3.8 (L) 4.0 - 10.5 K/uL   RBC 3.98 3.87 - 5.11 MIL/uL   Hemoglobin 12.2 12.0 - 15.0 g/dL   HCT 64.2 (L) 63.9 - 53.9 %   MCV 89.7 80.0 - 100.0 fL   MCH 30.7 26.0 - 34.0 pg   MCHC 34.2 30.0 - 36.0 g/dL   RDW 88.4 88.4 - 84.4 %   Platelets 209 150 - 400 K/uL   nRBC 0.0 0.0 - 0.2 %   Neutrophils Relative % 42 %   Neutro Abs 1.6 (L) 1.7 - 7.7 K/uL   Lymphocytes Relative 38 %   Lymphs Abs 1.4 0.7 - 4.0 K/uL   Monocytes Relative 17 %   Monocytes Absolute 0.6 0.1 - 1.0 K/uL   Eosinophils Relative 3 %   Eosinophils Absolute 0.1 0.0 - 0.5 K/uL   Basophils Relative 0 %   Basophils Absolute 0.0 0.0 - 0.1 K/uL   Immature Granulocytes 0 %   Abs Immature Granulocytes 0.01 0.00 - 0.07 K/uL    Comment: Performed at [facility], [address] [place], [place] [address]  hCG, quantitative, pregnancy     Status: Abnormal   Collection Time: 12/04/24  6:04 PM  Result Value Ref Range   hCG, Beta Chain, Quant, S 1,967 (H) <5 mIU/mL    Comment:          GEST. AGE      CONC.  (mIU/mL)   <=1 WEEK        5 - 50     2 WEEKS       50 - 500     3 WEEKS       100 - 10,000     4 WEEKS     1,000 - 30,000     5 WEEKS     3,500 - 115,000   6-8 WEEKS     12,000 - 270,000    12 WEEKS     15,000 - 220,000        FEMALE AND NON-PREGNANT FEMALE:     LESS THAN 5 mIU/mL Performed at [facility], [address] [place], [place] [address]    Assessment and plan: 24 year old G2 P0-1-0-1 at 8+4 by LMP admitted for presumed pyelonephritis 1) quantitative hCG dropped from 2350  to 1967, drop in hCG is consistent with a missed miscarriage.  This is consistent with the patient's prior ultrasound findings and ongoing menstrual bleeding.  Management options were discussed with the patient and she wishes to proceed with surgical management with suction D&E.  -Case added onto OR schedule for tomorrow. - N.p.o. after midnight 2) presumed pyelonephritis: Working admission diagnosis of presumed pyelowas based on patient's presentation of pain and positive nitrites on urine analysis.  Urine culture returned today negative for growth.  Discontinue antibiotics.

## 2024-12-06 ENCOUNTER — Inpatient Hospital Stay (HOSPITAL_COMMUNITY): Admitting: Anesthesiology

## 2024-12-06 ENCOUNTER — Encounter (HOSPITAL_COMMUNITY)
Admission: AD | Disposition: A | Discharge status: Home / Self Care | Payer: Self-pay | Source: Home / Self Care | Attending: Obstetrics and Gynecology

## 2024-12-06 ENCOUNTER — Other Ambulatory Visit: Payer: Self-pay

## 2024-12-06 ENCOUNTER — Encounter (HOSPITAL_COMMUNITY): Payer: Self-pay | Admitting: Student

## 2024-12-06 DIAGNOSIS — O021 Missed abortion: Secondary | ICD-10-CM | POA: Diagnosis not present

## 2024-12-06 DIAGNOSIS — N12 Tubulo-interstitial nephritis, not specified as acute or chronic: Secondary | ICD-10-CM | POA: Diagnosis present

## 2024-12-06 DIAGNOSIS — Z3A00 Weeks of gestation of pregnancy not specified: Secondary | ICD-10-CM | POA: Diagnosis not present

## 2024-12-06 HISTORY — PX: DILATION AND EVACUATION: SHX1459

## 2024-12-06 LAB — COMPREHENSIVE METABOLIC PANEL WITH GFR
ALT: 12 U/L (ref 0–44)
AST: 19 U/L (ref 15–41)
Albumin: 4 g/dL (ref 3.5–5.0)
Alkaline Phosphatase: 55 U/L (ref 38–126)
Anion gap: 11 (ref 5–15)
BUN: 12 mg/dL (ref 6–20)
CO2: 26 mmol/L (ref 22–32)
Calcium: 9.2 mg/dL (ref 8.9–10.3)
Chloride: 104 mmol/L (ref 98–111)
Creatinine, Ser: 0.65 mg/dL (ref 0.44–1.00)
GFR, Estimated: >60 mL/min
Glucose, Bld: 89 mg/dL (ref 70–99)
Potassium: 4.6 mmol/L (ref 3.5–5.1)
Sodium: 141 mmol/L (ref 135–145)
Total Bilirubin: <0.2 mg/dL (ref 0.0–1.2)
Total Protein: 6.4 g/dL — ABNORMAL LOW (ref 6.5–8.1)

## 2024-12-06 LAB — CBC WITH DIFFERENTIAL/PLATELET
Abs Immature Granulocytes: 0.01 K/uL (ref 0.00–0.07)
Basophils Absolute: 0 K/uL (ref 0.0–0.1)
Basophils Relative: 1 %
Eosinophils Absolute: 0.1 K/uL (ref 0.0–0.5)
Eosinophils Relative: 3 %
HCT: 36.7 % (ref 36.0–46.0)
Hemoglobin: 12.3 g/dL (ref 12.0–15.0)
Immature Granulocytes: 0 %
Lymphocytes Relative: 50 %
Lymphs Abs: 2.1 K/uL (ref 0.7–4.0)
MCH: 30.1 pg (ref 26.0–34.0)
MCHC: 33.5 g/dL (ref 30.0–36.0)
MCV: 90 fL (ref 80.0–100.0)
Monocytes Absolute: 0.5 K/uL (ref 0.1–1.0)
Monocytes Relative: 12 %
Neutro Abs: 1.4 K/uL — ABNORMAL LOW (ref 1.7–7.7)
Neutrophils Relative %: 34 %
Platelets: 226 K/uL (ref 150–400)
RBC: 4.08 MIL/uL (ref 3.87–5.11)
RDW: 11.1 % — ABNORMAL LOW (ref 11.5–15.5)
WBC: 4.2 K/uL (ref 4.0–10.5)
nRBC: 0 % (ref 0.0–0.2)

## 2024-12-06 LAB — SYPHILIS: RPR W/REFLEX TO RPR TITER AND TREPONEMAL ANTIBODIES, TRADITIONAL SCREENING AND DIAGNOSIS ALGORITHM: RPR Ser Ql: NONREACTIVE

## 2024-12-06 MED ORDER — FENTANYL CITRATE (PF) 100 MCG/2ML IJ SOLN
INTRAMUSCULAR | Status: AC
  Filled 2024-12-06: qty 2

## 2024-12-06 MED ORDER — 0.9 % SODIUM CHLORIDE (POUR BTL) OPTIME
TOPICAL | Status: DC | PRN
  Administered 2024-12-06: 1000 mL

## 2024-12-06 MED ORDER — POVIDONE-IODINE 10 % EX SWAB
2.0000 | Freq: Once | CUTANEOUS | Status: AC
  Administered 2024-12-06: 2 via TOPICAL

## 2024-12-06 MED ORDER — LACTATED RINGERS IV SOLN: INTRAVENOUS | Status: DC

## 2024-12-06 MED ORDER — CEFAZOLIN SODIUM-DEXTROSE 2-4 GM/100ML-% IV SOLN
2.0000 g | Freq: Once | INTRAVENOUS | Status: AC
  Administered 2024-12-06: 2 g via INTRAVENOUS
  Filled 2024-12-06: qty 100

## 2024-12-06 MED ORDER — LIDOCAINE HCL 1 % IJ SOLN
INTRAMUSCULAR | Status: DC | PRN
  Administered 2024-12-06: 10 mL

## 2024-12-06 MED ORDER — PROPOFOL 10 MG/ML IV BOLUS
INTRAVENOUS | Status: DC | PRN
  Administered 2024-12-06: 130 mg via INTRAVENOUS

## 2024-12-06 MED ORDER — PROPOFOL 10 MG/ML IV BOLUS
INTRAVENOUS | Status: AC
  Filled 2024-12-06: qty 20

## 2024-12-06 MED ORDER — AMISULPRIDE (ANTIEMETIC) 5 MG/2ML IV SOLN: 10.0000 mg | Freq: Once | INTRAVENOUS | Status: DC | PRN

## 2024-12-06 MED ORDER — MIDAZOLAM HCL 2 MG/2ML IJ SOLN
INTRAMUSCULAR | Status: AC
  Filled 2024-12-06: qty 2

## 2024-12-06 MED ORDER — ORAL CARE MOUTH RINSE: 15.0000 mL | Freq: Once | OROMUCOSAL | Status: AC

## 2024-12-06 MED ORDER — ONDANSETRON HCL 4 MG/2ML IJ SOLN
INTRAMUSCULAR | Status: AC
  Filled 2024-12-06: qty 2

## 2024-12-06 MED ORDER — ACETAMINOPHEN 500 MG PO TABS: 1000.0000 mg | ORAL_TABLET | Freq: Once | ORAL | Status: DC

## 2024-12-06 MED ORDER — LIDOCAINE 2% (20 MG/ML) 5 ML SYRINGE
INTRAMUSCULAR | Status: AC
  Filled 2024-12-06: qty 5

## 2024-12-06 MED ORDER — KETOROLAC TROMETHAMINE 30 MG/ML IJ SOLN
INTRAMUSCULAR | Status: DC | PRN
  Administered 2024-12-06: 30 mg via INTRAVENOUS

## 2024-12-06 MED ORDER — SUCCINYLCHOLINE CHLORIDE 200 MG/10ML IV SOSY
PREFILLED_SYRINGE | INTRAVENOUS | Status: DC | PRN
  Administered 2024-12-06: 100 mg via INTRAVENOUS

## 2024-12-06 MED ORDER — SUCCINYLCHOLINE CHLORIDE 200 MG/10ML IV SOSY
PREFILLED_SYRINGE | INTRAVENOUS | Status: AC
  Filled 2024-12-06: qty 10

## 2024-12-06 MED ORDER — CHLORHEXIDINE GLUCONATE 0.12 % MT SOLN
OROMUCOSAL | Status: AC
  Administered 2024-12-06: 15 mL via OROMUCOSAL
  Filled 2024-12-06: qty 15

## 2024-12-06 MED ORDER — FENTANYL CITRATE (PF) 100 MCG/2ML IJ SOLN
25.0000 ug | INTRAMUSCULAR | Status: DC | PRN
  Administered 2024-12-06 (×2): 50 ug via INTRAVENOUS

## 2024-12-06 MED ORDER — OXYCODONE-ACETAMINOPHEN 5-325 MG PO TABS: 1.0000 | ORAL_TABLET | Freq: Four times a day (QID) | ORAL | 0 refills | Status: AC | PRN

## 2024-12-06 MED ORDER — DEXAMETHASONE SOD PHOSPHATE PF 10 MG/ML IJ SOLN
INTRAMUSCULAR | Status: DC | PRN
  Administered 2024-12-06: 10 mg via INTRAVENOUS

## 2024-12-06 MED ORDER — KETOROLAC TROMETHAMINE 30 MG/ML IJ SOLN
INTRAMUSCULAR | Status: AC
  Filled 2024-12-06: qty 1

## 2024-12-06 MED ORDER — IBUPROFEN 600 MG PO TABS: 600.0000 mg | ORAL_TABLET | Freq: Four times a day (QID) | ORAL | 1 refills | Status: AC | PRN

## 2024-12-06 MED ORDER — ONDANSETRON HCL 4 MG/2ML IJ SOLN: 4.0000 mg | Freq: Once | INTRAMUSCULAR | Status: DC | PRN

## 2024-12-06 MED ORDER — FENTANYL CITRATE (PF) 100 MCG/2ML IJ SOLN
INTRAMUSCULAR | Status: DC | PRN
  Administered 2024-12-06 (×2): 50 ug via INTRAVENOUS

## 2024-12-06 MED ORDER — ONDANSETRON HCL 4 MG/2ML IJ SOLN
INTRAMUSCULAR | Status: DC | PRN
  Administered 2024-12-06: 4 mg via INTRAVENOUS

## 2024-12-06 MED ORDER — CHLORHEXIDINE GLUCONATE 0.12 % MT SOLN: 15.0000 mL | Freq: Once | OROMUCOSAL | Status: AC

## 2024-12-06 MED ORDER — LIDOCAINE HCL (CARDIAC) PF 100 MG/5ML IV SOSY
PREFILLED_SYRINGE | INTRAVENOUS | Status: DC | PRN
  Administered 2024-12-06: 80 mg via INTRAVENOUS

## 2024-12-06 MED ORDER — MIDAZOLAM HCL 5 MG/5ML IJ SOLN
INTRAMUSCULAR | Status: DC | PRN
  Administered 2024-12-06: 2 mg via INTRAVENOUS

## 2024-12-06 MED ORDER — KETOROLAC TROMETHAMINE 30 MG/ML IJ SOLN: 30.0000 mg | Freq: Once | INTRAMUSCULAR | Status: DC

## 2024-12-06 NOTE — Interval H&P Note (Signed)
 History and Physical Interval Note: Pt with missed abortion as ascertained by vaginal bleeding and dropping quant HCG levels.  Pt opted for treatment via suction D&E today rather than via oral medication She has been NPO since midnight  Afebrile since admission 3 days ago - on rocephin  for past three days.  R/B of suction dilation and evacuation of products of conception reviewed and pt consents to proceed with treatment  To OR when ready   12/06/2024 9:45 AM  Andrea Wilson  has presented today for surgery, with the diagnosis of Miscarriage.  The various methods of treatment have been discussed with the patient and family. After consideration of risks, benefits and other options for treatment, the patient has consented to  Procedures: DILATION AND EVACUATION, UTERUS (N/A) as a surgical intervention.  The patient's history has been reviewed, patient examined, no change in status, stable for surgery.  I have reviewed the patient's chart and labs.  Questions were answered to the patient's satisfaction.     Ted LELON Solo

## 2024-12-06 NOTE — Anesthesia Postprocedure Evaluation (Signed)
"   Anesthesia Post Note  Patient: Andrea Wilson  Procedure(s) Performed: DILATION AND EVACUATION, UTERUS with ANORA TEST (Uterus)     Patient location during evaluation: PACU Anesthesia Type: General Level of consciousness: awake and alert Pain management: pain level controlled Vital Signs Assessment: post-procedure vital signs reviewed and stable Respiratory status: spontaneous breathing, nonlabored ventilation and respiratory function stable Cardiovascular status: blood pressure returned to baseline, stable and bradycardic Postop Assessment: no apparent nausea or vomiting Anesthetic complications: no   No notable events documented.  Last Vitals:  Vitals:   12/06/24 1315 12/06/24 1330  BP: 93/60 110/70  Pulse: 60 (!) 55  Resp: 12 10  Temp:  36.6 C  SpO2: 92% 94%    Last Pain:  Vitals:   12/06/24 1312  TempSrc:   PainSc: 7                  Garnette FORBES Skillern      "

## 2024-12-06 NOTE — Discharge Summary (Signed)
 Physician Discharge Summary  Patient ID: Andrea Wilson MRN: [id number] DOB/AGE: 11-Jul-2001 24 y.o.  Admit date: 12/03/2024 Discharge date: 12/06/2024  Admission Diagnoses:  Febrile illness - suspect pyelonephritis  Threatened abortion at 8wks    Discharge Diagnoses:  Principal Problem:   Pyelonephritis complicating pregnancy Active Problems:   Pyelonephritis 2. Missed abortion at 8weeks - s/p suction D&E  Discharged Condition: stable  Hospital Course: Pt was treated with IV rocephin  for 2-3 days. During her stay she continued to have mild spotting and her quant HCG was noted to drop confirming missed abortion . No FP was noted on US  prior to hospitalization x 3  Pt underwent a suction D&E after she was afebrile >24hrs and deemed stable for discharge to home    Consults: None  Significant Diagnostic Studies: labs: cbc wnl  Treatments: IV hydration, antibiotics: rocephin , and analgesia: acetaminophen  And surgery   Discharge Exam: Blood pressure 110/70, pulse (!) 55, temperature 97.8 F (36.6 C), resp. rate 10, height 5' 5 (1.651 m), weight 60.2 kg, last menstrual period 10/06/2024, SpO2 94%. General appearance: alert, cooperative, and no distress GI: soft, non-tender; bowel sounds normal; no masses,  no organomegaly Extremities: Homans sign is negative, no sign of DVT  Disposition: Discharge disposition: 01-Home or Self Care       Discharge Instructions     Call MD for:  difficulty breathing, headache or visual disturbances   Complete by: As directed    Call MD for:  persistant dizziness or light-headedness   Complete by: As directed    Call MD for:  persistant nausea and vomiting   Complete by: As directed    Call MD for:  severe uncontrolled pain   Complete by: As directed    Call MD for:  temperature >100.4   Complete by: As directed    Discharge instructions   Complete by: As directed    Call office with any concerns [phone]   Driving Restrictions    Complete by: As directed    None while taking narcotic pain medication and none for 24hrs following anesthesia administration   Increase activity slowly   Complete by: As directed    Lifting restrictions   Complete by: As directed    As tolerated   Sexual Activity Restrictions   Complete by: As directed    None till bleeding stops      Allergies as of 12/06/2024       Reactions   Prednisone  Other (See Comments)        Medication List     TAKE these medications    acetaminophen  325 MG tablet Commonly known as: Tylenol  Take 2 tablets (650 mg total) by mouth every 6 (six) hours as needed (for pain scale < 4).   escitalopram  10 MG tablet Commonly known as: Lexapro  Take 1 tablet (10 mg total) by mouth daily. for anxiety and depression.   hydrOXYzine  25 MG tablet Commonly known as: ATARAX  Take 1 tablet (25 mg total) by mouth 3 (three) times daily as needed.   ibuprofen  600 MG tablet Commonly known as: ADVIL  Take 1 tablet (600 mg total) by mouth every 6 (six) hours as needed for moderate pain (pain score 4-6) or cramping.   oxyCODONE -acetaminophen  5-325 MG tablet Commonly known as: Percocet Take 1 tablet by mouth every 6 (six) hours as needed for up to 3 days for severe pain (pain score 7-10).   PRENATAL VITAMIN PO Take by mouth daily.   propranolol  20 MG tablet  Commonly known as: INDERAL  Take 20 mg by mouth daily at 6 (six) AM.        Follow-up Information     Delana Ted Morrison, DO Follow up.   Specialty: Obstetrics and Gynecology Why: As needed Contact information: [address] [place] [address] [place] [address] [phone]                 Signed: Ted LELON Delana 12/06/2024, 9:15 PM

## 2024-12-06 NOTE — Discharge Instructions (Addendum)
 Call office with any concerns [phone]

## 2024-12-06 NOTE — Anesthesia Preprocedure Evaluation (Addendum)
"                                    Anesthesia Evaluation  Patient identified by MRN, date of birth, ID band Patient awake    Reviewed: Allergy  & Precautions, NPO status , Patient's Chart, lab work & pertinent test results, reviewed documented beta blocker date and time   Airway Mallampati: II  TM Distance: >3 FB Neck ROM: Full    Dental  (+) Teeth Intact, Dental Advisory Given   Pulmonary neg pulmonary ROS   Pulmonary exam normal breath sounds clear to auscultation       Cardiovascular Pt. on home beta blockers Normal cardiovascular exam Rhythm:Regular Rate:Normal     Neuro/Psych  Headaches PSYCHIATRIC DISORDERS Anxiety Depression       GI/Hepatic negative GI ROS, Neg liver ROS,,,  Endo/Other  negative endocrine ROS    Renal/GU Renal disease     Musculoskeletal negative musculoskeletal ROS (+)    Abdominal   Peds  Hematology negative hematology ROS (+)   Anesthesia Other Findings Day of surgery medications reviewed with the patient.  Reproductive/Obstetrics Missed abortion                               Anesthesia Physical Anesthesia Plan  ASA: 2  Anesthesia Plan: General   Post-op Pain Management: Tylenol  PO (pre-op)*   Induction: Intravenous  PONV Risk Score and Plan: 3 and Midazolam , Dexamethasone  and Ondansetron   Airway Management Planned: Oral ETT  Additional Equipment:   Intra-op Plan:   Post-operative Plan: Extubation in OR  Informed Consent: I have reviewed the patients History and Physical, chart, labs and discussed the procedure including the risks, benefits and alternatives for the proposed anesthesia with the patient or authorized representative who has indicated his/her understanding and acceptance.     Dental advisory given  Plan Discussed with: CRNA  Anesthesia Plan Comments:          Anesthesia Quick Evaluation  "

## 2024-12-06 NOTE — Anesthesia Procedure Notes (Signed)
 Procedure Name: Intubation Date/Time: 12/06/2024 11:48 AM  Performed by: Rockelle Heuerman, Corean BROCKS, CRNAPre-anesthesia Checklist: Patient identified, Emergency Drugs available, Suction available and Patient being monitored Patient Re-evaluated:Patient Re-evaluated prior to induction Oxygen Delivery Method: Circle system utilized Preoxygenation: Pre-oxygenation with 100% oxygen Induction Type: IV induction Ventilation: Mask ventilation without difficulty Laryngoscope Size: Mac and 3 Grade View: Grade I Tube type: Oral Tube size: 7.0 mm Number of attempts: 1 Airway Equipment and Method: Stylet and Oral airway Placement Confirmation: ETT inserted through vocal cords under direct vision, positive ETCO2 and breath sounds checked- equal and bilateral Secured at: 21 cm Tube secured with: Tape Dental Injury: Teeth and Oropharynx as per pre-operative assessment

## 2024-12-06 NOTE — Op Note (Signed)
 Operative Note    Preoperative Diagnosis Missed abortion at [redacted] weeks gestation   Postoperative Diagnosis: Same  2. S/P Suction dilation and curettage of uterus   Procedure: Suction dilation and curettage of uterus    Surgeon: Delana, C DO   Anesthesia: General ( MAC)  Fluids: LR EBL: UOP: Meds: Ancef  Findings: Midline uterus sounded to 8cm. Mild to moderate amount of products evacuated. Gritty texture noted in all four quadrants at completion   Specimen: Products of conception to pathology and for anora testing   Procedure Note Consent was confirmed from  prior to OR.   Patient was taken to the operating room where general anesthesia was administered without difficulty. She was then prepped and draped in the normal sterile fashion while in the dorsal lithotomy position. An appropriate time out was performed. An exam under anesthesia noted a midline uterus.   A speculum was then placed in the vagina and the anterior lip of the cervix identified and grasped with a single toothed tenaculum. The uterus was then sounded to 8cm which was consistent with expected gestational age.  The Pratt dilators were utilized to dilate the cervix up to approximately 23. A size 8 rigid cannula was then inserted into the uterine cavity and suction begun. There was small to moderate amount of products evacuated. After 4 passes, a sharp curettage was then performed with a gritty texture noted in all quadrants Four more passes with the suction cannula were done. Scant amount of products were returned this time.   At this point, the tenaculum was removed with the site noted to be hemostatic.  There was scant to no bleeding from cervix. The speculum was thus removed as well. Pt was cleaned and awakened. She was taken to the recovery room in stable condition.  Counts were correct per nursing staff during and at the end of the procedure

## 2024-12-06 NOTE — Interval H&P Note (Signed)
 History and Physical Interval Note: Pt desires anora testing  12/06/2024 9:47 AM  Andrea Wilson  has presented today for surgery, with the diagnosis of Miscarriage.  The various methods of treatment have been discussed with the patient and family. After consideration of risks, benefits and other options for treatment, the patient has consented to  Procedures: DILATION AND EVACUATION, UTERUS (N/A) as a surgical intervention.  The patient's history has been reviewed, patient examined, no change in status, stable for surgery.  I have reviewed the patient's chart and labs.  Questions were answered to the patient's satisfaction.     Ted LELON Solo

## 2024-12-06 NOTE — Transfer of Care (Signed)
 Immediate Anesthesia Transfer of Care Note  Patient: Andrea Wilson  Procedure(s) Performed: DILATION AND EVACUATION, UTERUS with ANORA TEST (Uterus)  Patient Location: PACU  Anesthesia Type:General  Level of Consciousness: awake, alert , and oriented  Airway & Oxygen Therapy: Patient Spontanous Breathing  Post-op Assessment: Report given to RN and Post -op Vital signs reviewed and stable  Post vital signs: Reviewed and stable  Last Vitals:  Vitals Value Taken Time  BP 124/85 12/06/24 12:27  Temp    Pulse 74 12/06/24 12:30  Resp 13 12/06/24 12:30  SpO2 92 % 12/06/24 12:30  Vitals shown include unfiled device data.  Last Pain:  Vitals:   12/06/24 1014  TempSrc: Oral  PainSc:          Complications: No notable events documented.

## 2024-12-07 ENCOUNTER — Encounter (HOSPITAL_COMMUNITY): Payer: Self-pay | Admitting: Obstetrics and Gynecology

## 2024-12-10 LAB — SURGICAL PATHOLOGY

## 2024-12-16 LAB — ANORA MISCARRIAGE TEST - FRESH
